# Patient Record
Sex: Female | Born: 1940
Health system: Southern US, Community
[De-identification: ages and names within clinical notes are randomized; demographics above are authoritative.]

## PROBLEM LIST (undated history)

## (undated) DIAGNOSIS — K5792 Diverticulitis of intestine, part unspecified, without perforation or abscess without bleeding: Secondary | ICD-10-CM

## (undated) DIAGNOSIS — I10 Essential (primary) hypertension: Secondary | ICD-10-CM

## (undated) DIAGNOSIS — F1090 Alcohol use, unspecified, uncomplicated: Secondary | ICD-10-CM

## (undated) DIAGNOSIS — Z83719 Family history of colon polyps, unspecified: Secondary | ICD-10-CM

## (undated) DIAGNOSIS — D649 Anemia, unspecified: Secondary | ICD-10-CM

## (undated) DIAGNOSIS — Z5189 Encounter for other specified aftercare: Secondary | ICD-10-CM

## (undated) DIAGNOSIS — D374 Neoplasm of uncertain behavior of colon: Secondary | ICD-10-CM

## (undated) DIAGNOSIS — M1990 Unspecified osteoarthritis, unspecified site: Secondary | ICD-10-CM

## (undated) DIAGNOSIS — Z8371 Family history of colonic polyps: Secondary | ICD-10-CM

## (undated) DIAGNOSIS — D638 Anemia in other chronic diseases classified elsewhere: Secondary | ICD-10-CM

## (undated) DIAGNOSIS — E038 Other specified hypothyroidism: Secondary | ICD-10-CM

## (undated) DIAGNOSIS — N1832 Chronic kidney disease, stage 3b: Secondary | ICD-10-CM

## (undated) DIAGNOSIS — K449 Diaphragmatic hernia without obstruction or gangrene: Secondary | ICD-10-CM

## (undated) DIAGNOSIS — T7840XA Allergy, unspecified, initial encounter: Secondary | ICD-10-CM

## (undated) DIAGNOSIS — R011 Cardiac murmur, unspecified: Secondary | ICD-10-CM

## (undated) DIAGNOSIS — K760 Fatty (change of) liver, not elsewhere classified: Secondary | ICD-10-CM

## (undated) DIAGNOSIS — M810 Age-related osteoporosis without current pathological fracture: Secondary | ICD-10-CM

## (undated) HISTORY — DX: Family history of colon polyps, unspecified: Z83.719

## (undated) HISTORY — DX: Unspecified osteoarthritis, unspecified site: M19.90

## (undated) HISTORY — PX: TUBAL LIGATION: SHX77

## (undated) HISTORY — PX: TOE SURGERY: SHX1073

## (undated) HISTORY — DX: Cardiac murmur, unspecified: R01.1

## (undated) HISTORY — DX: Anemia, unspecified: D64.9

## (undated) HISTORY — PX: ABDOMINAL HYSTERECTOMY: SHX81

## (undated) HISTORY — DX: Neoplasm of uncertain behavior of colon: D37.4

## (undated) HISTORY — DX: Encounter for other specified aftercare: Z51.89

## (undated) HISTORY — DX: Allergy, unspecified, initial encounter: T78.40XA

## (undated) HISTORY — DX: Diaphragmatic hernia without obstruction or gangrene: K44.9

## (undated) HISTORY — DX: Age-related osteoporosis without current pathological fracture: M81.0

## (undated) HISTORY — DX: Family history of colonic polyps: Z83.71

## (undated) HISTORY — DX: Essential (primary) hypertension: I10

## (undated) HISTORY — PX: LAPAROSCOPIC SIGMOID COLECTOMY: SHX5928

## (undated) HISTORY — DX: Diverticulitis of intestine, part unspecified, without perforation or abscess without bleeding: K57.92

## (undated) HISTORY — PX: PARTIAL HYSTERECTOMY: SHX80

---

## 1998-03-21 ENCOUNTER — Ambulatory Visit (HOSPITAL_COMMUNITY): Admission: RE | Admit: 1998-03-21 | Discharge: 1998-03-21 | Payer: Self-pay | Admitting: Family Medicine

## 1998-04-08 ENCOUNTER — Other Ambulatory Visit: Admission: RE | Admit: 1998-04-08 | Discharge: 1998-04-08 | Payer: Self-pay | Admitting: Gastroenterology

## 1998-04-29 ENCOUNTER — Inpatient Hospital Stay (HOSPITAL_COMMUNITY): Admission: RE | Admit: 1998-04-29 | Discharge: 1998-05-06 | Payer: Self-pay | Admitting: General Surgery

## 2000-05-25 ENCOUNTER — Ambulatory Visit (HOSPITAL_COMMUNITY): Admission: RE | Admit: 2000-05-25 | Discharge: 2000-05-25 | Payer: Self-pay | Admitting: Internal Medicine

## 2000-05-25 ENCOUNTER — Encounter: Payer: Self-pay | Admitting: Internal Medicine

## 2000-06-08 ENCOUNTER — Encounter (INDEPENDENT_AMBULATORY_CARE_PROVIDER_SITE_OTHER): Payer: Self-pay

## 2000-06-08 ENCOUNTER — Other Ambulatory Visit: Admission: RE | Admit: 2000-06-08 | Discharge: 2000-06-08 | Payer: Self-pay | Admitting: Gastroenterology

## 2003-09-12 ENCOUNTER — Encounter: Admission: RE | Admit: 2003-09-12 | Discharge: 2003-09-12 | Payer: Self-pay | Admitting: Internal Medicine

## 2005-07-22 ENCOUNTER — Ambulatory Visit: Payer: Self-pay | Admitting: Internal Medicine

## 2005-08-05 ENCOUNTER — Ambulatory Visit: Payer: Self-pay | Admitting: Internal Medicine

## 2005-08-11 ENCOUNTER — Encounter: Admission: RE | Admit: 2005-08-11 | Discharge: 2005-08-11 | Payer: Self-pay | Admitting: Internal Medicine

## 2006-04-23 ENCOUNTER — Ambulatory Visit: Payer: Self-pay | Admitting: Internal Medicine

## 2006-05-24 ENCOUNTER — Ambulatory Visit: Payer: Self-pay | Admitting: Internal Medicine

## 2006-11-26 ENCOUNTER — Encounter: Admission: RE | Admit: 2006-11-26 | Discharge: 2006-11-26 | Payer: Self-pay | Admitting: Internal Medicine

## 2007-03-07 ENCOUNTER — Ambulatory Visit: Payer: Self-pay | Admitting: Gastroenterology

## 2007-03-22 ENCOUNTER — Ambulatory Visit: Payer: Self-pay | Admitting: Gastroenterology

## 2007-03-22 ENCOUNTER — Encounter: Payer: Self-pay | Admitting: Gastroenterology

## 2007-04-26 ENCOUNTER — Emergency Department (HOSPITAL_COMMUNITY): Admission: EM | Admit: 2007-04-26 | Discharge: 2007-04-26 | Payer: Self-pay | Admitting: Emergency Medicine

## 2007-05-09 ENCOUNTER — Ambulatory Visit: Payer: Self-pay | Admitting: Internal Medicine

## 2007-05-09 ENCOUNTER — Encounter: Payer: Self-pay | Admitting: Internal Medicine

## 2007-05-09 DIAGNOSIS — M169 Osteoarthritis of hip, unspecified: Secondary | ICD-10-CM | POA: Insufficient documentation

## 2007-05-09 DIAGNOSIS — D126 Benign neoplasm of colon, unspecified: Secondary | ICD-10-CM | POA: Insufficient documentation

## 2007-05-09 DIAGNOSIS — I10 Essential (primary) hypertension: Secondary | ICD-10-CM | POA: Insufficient documentation

## 2008-02-28 ENCOUNTER — Ambulatory Visit: Payer: Self-pay | Admitting: Internal Medicine

## 2008-02-28 DIAGNOSIS — L659 Nonscarring hair loss, unspecified: Secondary | ICD-10-CM | POA: Insufficient documentation

## 2008-02-28 LAB — CONVERTED CEMR LAB
Calcium: 9.9 mg/dL (ref 8.4–10.5)
Chloride: 99 meq/L (ref 96–112)
Creatinine, Ser: 0.8 mg/dL (ref 0.4–1.2)
Free T4: 0.6 ng/dL (ref 0.6–1.6)
GFR calc non Af Amer: 76 mL/min

## 2008-02-29 ENCOUNTER — Encounter: Payer: Self-pay | Admitting: Internal Medicine

## 2009-06-19 ENCOUNTER — Telehealth (INDEPENDENT_AMBULATORY_CARE_PROVIDER_SITE_OTHER): Payer: Self-pay | Admitting: *Deleted

## 2009-07-31 ENCOUNTER — Ambulatory Visit: Payer: Self-pay | Admitting: Internal Medicine

## 2009-07-31 DIAGNOSIS — J069 Acute upper respiratory infection, unspecified: Secondary | ICD-10-CM | POA: Insufficient documentation

## 2009-07-31 LAB — CONVERTED CEMR LAB
Basophils Absolute: 0 10*3/uL (ref 0.0–0.1)
Calcium: 10.8 mg/dL — ABNORMAL HIGH (ref 8.4–10.5)
Creatinine, Ser: 0.9 mg/dL (ref 0.4–1.2)
Eosinophils Absolute: 0 10*3/uL (ref 0.0–0.7)
Folate: >20 ng/mL
HCT: 36.2 % (ref 36.0–46.0)
Hemoglobin: 11.7 g/dL — ABNORMAL LOW (ref 12.0–15.0)
Lymphs Abs: 0.9 10*3/uL (ref 0.7–4.0)
MCHC: 32.3 g/dL (ref 30.0–36.0)
Monocytes Absolute: 0.9 10*3/uL (ref 0.1–1.0)
Neutro Abs: 5 10*3/uL (ref 1.4–7.7)
RDW: 13.1 % (ref 11.5–14.6)

## 2009-08-01 ENCOUNTER — Telehealth (INDEPENDENT_AMBULATORY_CARE_PROVIDER_SITE_OTHER): Payer: Self-pay | Admitting: *Deleted

## 2009-08-13 DIAGNOSIS — M810 Age-related osteoporosis without current pathological fracture: Secondary | ICD-10-CM | POA: Insufficient documentation

## 2009-08-13 HISTORY — DX: Age-related osteoporosis without current pathological fracture: M81.0

## 2009-08-13 HISTORY — PX: OTHER SURGICAL HISTORY: SHX169

## 2009-08-13 LAB — HM DEXA SCAN

## 2009-08-26 ENCOUNTER — Encounter (INDEPENDENT_AMBULATORY_CARE_PROVIDER_SITE_OTHER): Payer: Self-pay | Admitting: *Deleted

## 2009-08-26 ENCOUNTER — Ambulatory Visit: Payer: Self-pay | Admitting: Internal Medicine

## 2009-09-05 ENCOUNTER — Encounter: Payer: Self-pay | Admitting: Internal Medicine

## 2009-09-05 ENCOUNTER — Ambulatory Visit: Payer: Self-pay | Admitting: Internal Medicine

## 2009-09-10 ENCOUNTER — Encounter: Payer: Self-pay | Admitting: Internal Medicine

## 2009-09-19 ENCOUNTER — Telehealth: Payer: Self-pay | Admitting: Internal Medicine

## 2009-10-27 ENCOUNTER — Encounter: Payer: Self-pay | Admitting: Internal Medicine

## 2010-02-20 ENCOUNTER — Encounter (INDEPENDENT_AMBULATORY_CARE_PROVIDER_SITE_OTHER): Payer: Self-pay | Admitting: *Deleted

## 2010-03-05 ENCOUNTER — Encounter (INDEPENDENT_AMBULATORY_CARE_PROVIDER_SITE_OTHER): Payer: Self-pay | Admitting: *Deleted

## 2010-04-12 HISTORY — PX: COLONOSCOPY: SHX174

## 2010-04-12 LAB — HM DEXA SCAN

## 2010-04-12 LAB — HM COLONOSCOPY

## 2010-04-17 ENCOUNTER — Encounter (INDEPENDENT_AMBULATORY_CARE_PROVIDER_SITE_OTHER): Payer: Self-pay | Admitting: *Deleted

## 2010-04-21 ENCOUNTER — Ambulatory Visit: Payer: Self-pay | Admitting: Gastroenterology

## 2010-05-07 ENCOUNTER — Ambulatory Visit: Payer: Self-pay | Admitting: Gastroenterology

## 2010-05-11 ENCOUNTER — Encounter: Payer: Self-pay | Admitting: Gastroenterology

## 2010-08-14 NOTE — Miscellaneous (Signed)
Summary: BONE DENSITY  Clinical Lists Changes  Orders: Added new Test order of T-Bone Densitometry (77080) - Signed Added new Test order of T-Lumbar Vertebral Assessment (77082) - Signed 

## 2010-08-14 NOTE — Miscellaneous (Signed)
Summary: anamantle cream prescription  Clinical Lists Changes  Medications: Added new medication of ANAMANTLE HC 3-0.5 % KIT (LIDOCAINE-HYDROCORTISONE ACE) apply twice daily as needed for hemorrhoids - Signed Rx of ANAMANTLE HC 3-0.5 % KIT (LIDOCAINE-HYDROCORTISONE ACE) apply twice daily as needed for hemorrhoids;  #1 x 5;  Signed;  Entered by: Grier Rocher, RN;  Authorized by: Meryl Dare MD [facility];  Method used: Electronically to [facility]*, [address], [place], [place], [place]  [address], Ph: [phone], Fax: [phone] Observations: Added new observation of ALLERGY REV: Done (05/07/2010 11:00)    Prescriptions: ANAMANTLE HC 3-0.5 % KIT (LIDOCAINE-HYDROCORTISONE ACE) apply twice daily as needed for hemorrhoids  #1 x 5   Entered by:   Grier Rocher, RN   Authorized by:   Meryl Dare MD [facility]   Signed by:   Grier Rocher, RN on 05/07/2010   Method used:   Electronically to        [facility]* (retail)       [address]       [place], [place]  [address]       Ph: [phone]       Fax: [phone]   RxID:   [id number]

## 2010-08-14 NOTE — Letter (Signed)
Summary: Colonoscopy Letter  [facility]  [address] [place], [place] [address]   Phone: [phone]  Fax: [phone]      February 20, 2010 MRN: [id number]   Margaret Nguyen [address] [place], [place]  [address]   Dear Margaret Nguyen,   According to your medical record, it is time for you to schedule a Colonoscopy. The American Cancer Society recommends this procedure as a method to detect early colon cancer. Patients with a family history of colon cancer, or a personal history of colon polyps or inflammatory bowel disease are at increased risk.  This letter has beeen generated based on the recommendations made at the time of your procedure. If you feel that in your particular situation this may no longer apply, please contact our office.  Please call our office at [phone] to schedule this appointment or to update your records at your earliest convenience.  Thank you for cooperating with [place] to provide you with the very best care possible.   Sincerely,  Judie Petit T. Russella Dar, M.D.  [facility] Gastroenterology Division [phone]

## 2010-08-14 NOTE — Miscellaneous (Signed)
Summary: Orders Update   Clinical Lists Changes  Problems: Added new problem of OTHER SCREENING MAMMOGRAM (ICD-V76.12) - Signed Orders: Added new Referral order of Radiology Referral (Radiology) - Signed

## 2010-08-14 NOTE — Progress Notes (Signed)
  Phone Note Outgoing Call   Reason for Call: Discuss lab or test results Summary of Call: please call patient - lab results are normal including blood counts, B12, thyroid. Will review in detail at her Feb visit.  THANKS Initial call taken by: Jacques Navy MD,  August 01, 2009 5:04 AM  Follow-up for Phone Call        informed pt of results Follow-up by: Ami Bullins CMA,  August 01, 2009 9:24 AM

## 2010-08-14 NOTE — Progress Notes (Signed)
    Preventive Care Screening  Mammogram:    Date:  09/10/2009    Results:  normal  Bone Density:    Date:  09/05/2009    Results:  abnormal

## 2010-08-14 NOTE — Assessment & Plan Note (Signed)
Summary: PT HAS MULTIPLE PROBLEMS-COLD-PREVIOUS TONGUE SWELLING-LB   Vital Signs:  Patient profile:   69 year old female Height:      64 inches Weight:      104 pounds BMI:     17.92 O2 Sat:      97 % on Room air Temp:     98.8 degrees F oral Pulse rate:   98 / minute BP sitting:   132 / 86  (left arm) Cuff size:   regular  Vitals Entered By: Bill Salinas CMA (July 31, 2009 10:00 AM)  O2 Flow:  Room air CC: pt here with complaint of cough (producing green mucous), sore throat and congestion x 1 week. Pt states monday her tongue started swelling but states she isn't real sure what triggered the reaction. Pt states she did take some Alka Seltzer and also ate peanut butter and shrimp. Pt states she did get a little relief from benydrl. PT is past due for a mammogram and tetanus. / ab   Primary Care Provider:  Jobe Mutch  CC:  pt here with complaint of cough (producing green mucous) and sore throat and congestion x 1 week. Pt states monday her tongue started swelling but states she isn't real sure what triggered the reaction. Pt states she did take some Alka Seltzer and also ate peanut butter and shrimp. Pt states she did get a little relief from benydrl. PT is past due for a mammogram and tetanus. / ab.  History of Present Illness: Patient presents for 7-10 days of cough productive of a lot of phlegm, sore throat, rhinorrhea. She had swelling of the tongue starting Monday. She took some benadryl which seems to have stopped the swelling. She also had left sided HA at the frontal region and pressure behind the eye sockets. No documented fever, no rigors. she does have a sense of coldness that seems to move through her body. She has been taking Benadryl allergy-sinus( APAP, Diphenhydraminephenylephrine 5 mg) and alka-seltzer plus ( ASA, chlorpheneiramine malieate, dextromethorphan, phenylephrine)   Current Medications (verified): 1)  Enalapril Maleate 20 Mg  Tabs (Enalapril Maleate) .... Once  Daily 2)  Hydrochlorothiazide 25 Mg  Tabs (Hydrochlorothiazide) .... Once Daily 3)  Fish Oil   Oil (Fish Oil) .... Once Daily 4)  Centrum Silver   Tabs (Multiple Vitamins-Minerals) .... Once Daily 5)  Calcium 500/d 500-400 Mg-Unit  Chew (Calcium-Vitamin D) .... Once Daily  Allergies (verified): No Known Drug Allergies  Past History:  Past Medical History: Last updated: 05/09/2007 UCD villous adenoma of sigmoid colon hiatal hernia with stricture- s/p dilatation Colonic polyps, hx of Hypertension  Past Surgical History: Last updated: 05/09/2007 chalzaion excised right eye sigmoid colectomy for villous adenoma Hysterectomy Tubal ligation PSH reviewed for relevance, FH reviewed for relevance  Review of Systems       The patient complains of hoarseness, dyspnea on exertion, and prolonged cough.  The patient denies anorexia, fever, weight loss, weight gain, decreased hearing, chest pain, abdominal pain, muscle weakness, suspicious skin lesions, depression, and enlarged lymph nodes.    Physical Exam  General:  WNWD AA female in no distress Head:  minimal tenderness to percussion over the left maxillary Eyes:  vision grossly intact, pupils equal, pupils round, corneas and lenses clear, and no injection.   Ears:  R ear normal and L ear normal.   Mouth:  tongue appears to be normal. Posterior pharynx is normal Lungs:  normal respiratory effort, normal breath sounds, no crackles, and no wheezes.  Heart:  normal rate, regular rhythm, no murmur, and no gallop.   Msk:  normal ROM, no joint tenderness, and no joint swelling.   Neurologic:  alert & oriented X3 and cranial nerves II-XII intact.   Skin:  turgor normal and color normal.   Cervical Nodes:  no anterior cervical adenopathy and no posterior cervical adenopathy.   Psych:  Oriented X3 and memory intact for recent and remote.     Impression & Recommendations:  Problem # 1:  URI (ICD-465.9) Probable sinus infection.  Plan -  amoxicillin 875mg  two times a day x 7           loratadine 10mg  once daily, sudafed 30mg  three times a day, robitussin DM three times a day   Problem # 2:  CHILLS WITHOUT FEVER (ICD-780.64)  persistent non-specific symptoms. Neck exam is normal  Plan - CBCD, TSH  Orders: TLB-B12 + Folate Pnl (91478_29562-Z30/QMV) TLB-TSH (Thyroid Stimulating Hormone) (84443-TSH) TLB-CBC Platelet - w/Differential (85025-CBCD)  Complete Medication List: 1)  Enalapril Maleate 20 Mg Tabs (Enalapril maleate) .... Once daily 2)  Hydrochlorothiazide 25 Mg Tabs (Hydrochlorothiazide) .... Once daily 3)  Fish Oil Oil (Fish oil) .... Once daily 4)  Centrum Silver Tabs (Multiple vitamins-minerals) .... Once daily 5)  Calcium 500/d 500-400 Mg-unit Chew (Calcium-vitamin d) .... Once daily 6)  Amoxicillin 875 Mg Tabs (Amoxicillin) .Marland Kitchen.. 1 by mouth two times a day x 7 for uri  Other Orders: TLB-BMP (Basic Metabolic Panel-BMET) (80048-METABOL) Prescriptions: AMOXICILLIN 875 MG TABS (AMOXICILLIN) 1 by mouth two times a day x 7 for URI  #14 x 0   Entered and Authorized by:   Jacques Navy MD   Signed by:   Jacques Navy MD on 07/31/2009   Method used:   Electronically to        [facility]* (retail)       [address]       [place], [place]  [address]       Ph: [phone]       Fax: [phone]   RxID:   [id number]

## 2010-08-14 NOTE — Procedures (Signed)
Summary: Colonoscopy  Patient: Margaret Nguyen Note: All result statuses are Final unless otherwise noted.  Tests: (1) Colonoscopy (COL)   COL Colonoscopy           [facility]ONE      [facility]     [address]     [place], [place]  [address]           COLONOSCOPY PROCEDURE REPORT           PATIENT:  Margaret Nguyen, Margaret Nguyen  MR#:  [id number]     BIRTHDATE:  September 23, 1940, 69 yrs. old  GENDER:  female     ENDOSCOPIST:  Judie Petit T. Russella Dar, MD, [facility]           PROCEDURE DATE:  05/07/2010     PROCEDURE:  Colonoscopy with biopsy and snare polypectomy     ASA CLASS:  Class II     INDICATIONS:  1) surveillance and high-risk screening  2) history     of adenomatous colon polyps, 1999     MEDICATIONS:   Fentanyl 75 mcg IV, Versed 7 mg IV     DESCRIPTION OF PROCEDURE:   After the risks benefits and     alternatives of the procedure were thoroughly explained, informed     consent was obtained.  Digital rectal exam was performed and     revealed moderate external hemorrhoids.   The LB [id number]-[id number]     [id number] endoscope was introduced through the anus and advanced to     the cecum, which was identified by both the appendix and ileocecal     valve, without limitations.  The quality of the prep was     excellent, using MoviPrep.  The instrument was then slowly     withdrawn as the colon was fully examined.     <<PROCEDUREIMAGES>>     FINDINGS:  Scattered diverticula were found in the descending to     ascending colon.  A sessile polyp was found in the ascending     colon. It was 4 mm in size. The polyp was removed using cold     biopsy forceps.  A sessile polyp was found in the descending     colon. It was 5 mm in size. Polyp was snared without cautery.     Retrieval was successful. There was evidence of a prior segmental     colectomy in the sigmoid colon. This was otherwise a normal     examination of the colon.   Retroflexed views in the rectum     revealed internal hemorrhoids, small.  The time to  cecum =  1.75     minutes. The scope was then withdrawn (time =  8  min) from the     patient and the procedure completed.           COMPLICATIONS:  None           ENDOSCOPIC IMPRESSION:     1) Diverticula, scattered     2) 4 mm sessile polyp in the ascending colon     3) 5 mm sessile polyp in the descending colon     4) Prior segmental colectomy     5) Internal and external hemorrhoids           RECOMMENDATIONS:     1) Await pathology results     2) High fiber diet with liberal fluid intake.     3) Repeat Colonoscopy in 3 years pending pathology review.  Venita Lick. Russella Dar, MD, [facility]           CC: Jacques Navy, MD           n.     Rosalie DoctorVenita Lick. Stark at 05/07/2010 10:19 AM           Matilde Bash, [id number]  Note: An exclamation mark (!) indicates a result that was not dispersed into the flowsheet. Document Creation Date: 05/07/2010 10:20 AM _______________________________________________________________________  (1) Order result status: Final Collection or observation date-time: 05/07/2010 10:14 Requested date-time:  Receipt date-time:  Reported date-time:  Referring Physician:   Ordering Physician: Claudette Head [phone]) Specimen Source:  Source: Launa Grill Order Number: [id number] Lab site:   Appended Document: Colonoscopy     Procedures Next Due Date:    Colonoscopy: 04/2013

## 2010-08-14 NOTE — Letter (Signed)
Summary: Previsit letter  [facility]  [address] [place], [place] [address]   Phone: [phone]  Fax: [phone]       03/05/2010 MRN: [id number]  [facility] [address] [place], [place]  [address]  Dear Ms. Margaret Nguyen,  Welcome to the Gastroenterology Division at [facility].    You are scheduled to see a nurse for your pre-procedure visit on 04/21/2010 at 9:00AM on the 3rd floor at [facility], [address]  We ask that you try to arrive at our office 15 minutes prior to your appointment time to allow for check-in.  Your nurse visit will consist of discussing your medical and surgical history, your immediate family medical history, and your medications.    Please bring a complete list of all your medications or, if you prefer, bring the medication bottles and we will list them.  We will need to be aware of both prescribed and over the counter drugs.  We will need to know exact dosage information as well.  If you are on blood thinners (Coumadin, Plavix, Aggrenox, Ticlid, etc.) please call our office today/prior to your appointment, as we need to consult with your physician about holding your medication.   Please be prepared to read and sign documents such as consent forms, a financial agreement, and acknowledgement forms.  If necessary, and with your consent, a friend or relative is welcome to sit-in on the nurse visit with you.  Please bring your insurance card so that we may make a copy of it.  If your insurance requires a referral to see a specialist, please bring your referral form from your primary care physician.  No co-pay is required for this nurse visit.     If you cannot keep your appointment, please call [phone] to cancel or reschedule prior to your appointment date.  This allows [place] the opportunity to schedule an appointment for another patient in need of care.    Thank you for choosing [facility] for your  medical needs.  We appreciate the opportunity to care for you.  Please visit [place] at our website  to learn more about our practice.                     Sincerely.                                                                                                                   The Gastroenterology Division

## 2010-08-14 NOTE — Letter (Signed)
Summary: Moviprep Instructions  [facility]  [address]   [place], [place] [address]   Phone: [phone]  Fax: [phone]       Margaret Nguyen    September 16, 1940    MRN: [id number]        Procedure Day /Date: Wednesday, 05-07-10     Arrival Time: 9:00 a.m.     Procedure Time: 10:00 a.m.     Location of Procedure:                    x   [facility] (4th Floor)                        PREPARATION FOR COLONOSCOPY WITH MOVIPREP   Starting 5 days prior to your procedure 05-02-10  do not eat nuts, seeds, popcorn, corn, beans, peas,  salads, or any raw vegetables.  Do not take any fiber supplements (e.g. Metamucil, Citrucel, and Benefiber).  THE DAY BEFORE YOUR PROCEDURE         DATE: 05-06-10   DAY: Tuesday  1.  Drink clear liquids the entire day-NO SOLID FOOD  2.  Do not drink anything colored red or purple.  Avoid juices with pulp.  No orange juice.  3.  Drink at least 64 oz. (8 glasses) of fluid/clear liquids during the day to prevent dehydration and help the prep work efficiently.  CLEAR LIQUIDS INCLUDE: Water Jello Ice Popsicles Tea (sugar ok, no milk/cream) Powdered fruit flavored drinks Coffee (sugar ok, no milk/cream) Gatorade Juice: apple, white grape, white cranberry  Lemonade Clear bullion, consomm, broth Carbonated beverages (any kind) Strained chicken noodle soup Hard Candy                             4.  In the morning, mix first dose of MoviPrep solution:    Empty 1 Pouch A and 1 Pouch B into the disposable container    Add lukewarm drinking water to the top line of the container. Mix to dissolve    Refrigerate (mixed solution should be used within 24 hrs)  5.  Begin drinking the prep at 5:00 p.m. The MoviPrep container is divided by 4 marks.   Every 15 minutes drink the solution down to the next mark (approximately 8 oz) until the full liter is complete.   6.  Follow completed prep with 16 oz of clear liquid of your  choice (Nothing red or purple).  Continue to drink clear liquids until bedtime.  7.  Before going to bed, mix second dose of MoviPrep solution:    Empty 1 Pouch A and 1 Pouch B into the disposable container    Add lukewarm drinking water to the top line of the container. Mix to dissolve    Refrigerate  THE DAY OF YOUR PROCEDURE      DATE: 05-07-10  DAY: Wednesday  Beginning at 5:00 a.m. (5 hours before procedure):         1. Every 15 minutes, drink the solution down to the next mark (approx 8 oz) until the full liter is complete.  2. Follow completed prep with 16 oz. of clear liquid of your choice.    3. You may drink clear liquids until 8:00 a.m.   (2 HOURS BEFORE PROCEDURE).   MEDICATION INSTRUCTIONS  Unless otherwise instructed, you should take regular prescription medications with a small sip of water   as early  as possible the morning of your procedure.    Additional medication instructions: Do not take HCTZ day of procedure.         OTHER INSTRUCTIONS  You will need a responsible adult at least 70 years of age to accompany you and drive you home.   This person must remain in the waiting room during your procedure.  Wear loose fitting clothing that is easily removed.  Leave jewelry and other valuables at home.  However, you may wish to bring a book to read or  an iPod/MP3 player to listen to music as you wait for your procedure to start.  Remove all body piercing jewelry and leave at home.  Total time from sign-in until discharge is approximately 2-3 hours.  You should go home directly after your procedure and rest.  You can resume normal activities the  day after your procedure.  The day of your procedure you should not:   Drive   Make legal decisions   Operate machinery   Drink alcohol   Return to work  You will receive specific instructions about eating, activities and medications before you leave.    The above instructions have been reviewed  and explained to me by   Ezra Sites RN  April 21, 2010 9:10 AM    I fully understand and can verbalize these instructions _____________________________ Date _________

## 2010-08-14 NOTE — Letter (Signed)
Summary: Patient Notice- Polyp Results  [facility]  [address] [place], [place] [address]   Phone: [phone]  Fax: [phone]        May 11, 2010 MRN: [id number]    Margaret Nguyen [address] [place], [place]  [address]    Dear Ms. [name],  I am pleased to inform you that the colon polyp(s) removed during your recent colonoscopy was (were) found to be benign (no cancer detected) upon pathologic examination.  I recommend you have a repeat colonoscopy examination in 3 years to look for recurrent polyps, as having colon polyps increases your risk for having recurrent polyps or even colon cancer in the future.  Should you develop new or worsening symptoms of abdominal pain, bowel habit changes or bleeding from the rectum or bowels, please schedule an evaluation with either your primary care physician or with me.  Continue treatment plan as outlined the day of your exam.  Please call us if you are having persistent problems or have questions about your condition that have not been fully answered at this time.  Sincerely,  Meryl Dare MD [facility]  This letter has been electronically signed by your physician.  Appended Document: Patient Notice- Polyp Results letter mailed

## 2010-08-14 NOTE — Assessment & Plan Note (Signed)
Summary: YEARLY FU/ LABS SAME DAY/NWS   Vital Signs:  Patient profile:   70 year old female Height:      64 inches Weight:      106 pounds BMI:     18.26 O2 Sat:      98 % on Room air Temp:     98.3 degrees F oral Pulse rate:   88 / minute BP sitting:   136 / 74  (left arm) Cuff size:   regular  Vitals Entered By: Bill Salinas CMA (August 26, 2009 10:03 AM)  O2 Flow:  Room air CC: pt here for yearly cpx. Pt is due for a tetanus shot, she has never had shingles vaccine. Las Bone Density scan ?, pt can't remember when she had her last eye exam or who she sees/ ab   Primary Care Provider:  Mikell Camp  CC:  pt here for yearly cpx. Pt is due for a tetanus shot, she has never had shingles vaccine. Las Bone Density scan ?, and pt can't remember when she had her last eye exam or who she sees/ ab.  History of Present Illness: Patient last seen in January for an URI. she has done better but still has some sinus congestion.   Reviewed chart and called [facility] - last [facility]ogram at [facility] in May '08.   Patient claims that she has had several visits to [facility] ED. No records can be located in EMR or [place] of any visits other than October 14th. Check ImageCast - no studies except for Apr 26, 2007. No record of any in-hospital stress tests or  bone density study which she claims she has had.   She reports that other then her cold symtoms she is feeling well.   Current Medications (verified): 1)  Enalapril Maleate 20 Mg  Tabs (Enalapril Maleate) .... Once Daily 2)  Hydrochlorothiazide 25 Mg  Tabs (Hydrochlorothiazide) .... Once Daily 3)  Fish Oil   Oil (Fish Oil) .... Once Daily 4)  Centrum Silver   Tabs (Multiple Vitamins-Minerals) .... Once Daily 5)  Calcium 500/d 500-400 Mg-Unit  Chew (Calcium-Vitamin D) .... Once Daily  Allergies (verified): No Known Drug Allergies  Past History:  Past Medical History: UCD villous adenoma of sigmoid colon hiatal hernia with  stricture- s/p dilatation Colonic polyps, hx of Hypertension   Physician roster:                    [facility] - Dr. Dione Booze  Past Surgical History: Reviewed history from 05/09/2007 and no changes required. chalzaion excised right eye sigmoid colectomy for villous adenoma Hysterectomy Tubal ligation  Family History: Reviewed history from 05/09/2007 and no changes required. father - died 41 heart disease, rheumatic fever mother- died 18, CAD/MI positive for arthritis, renal failure and hypertension  Social History: Reviewed history from 05/09/2007 and no changes required. married [phone], widowed 1 son, 2 daughters, 1 son deceased 5 grandchildren Lives alone; [employer] working- fulltime Health and safety inspector.  Review of Systems  The patient denies anorexia, fever, weight loss, weight gain, vision loss, chest pain, syncope, dyspnea on exertion, hemoptysis, abdominal pain, severe indigestion/heartburn, muscle weakness, transient blindness, difficulty walking, abnormal bleeding, enlarged lymph nodes, and angioedema.    Physical Exam  General:  Thin AA female in no distress Head:  Normocephalic and atraumatic without obvious abnormalities. No apparent alopecia or balding. Eyes:  pupils equal, pupils round, corneas and lenses clear, and no injection.   Ears:  R ear normal and L  ear normal.   Nose:  no external deformity and no external erythema.   Mouth:  missing several teeth: molars and premolars. No gingival disease, no buccal lesions, posterior pharynx clear Neck:  full ROM, no thyromegaly, and no carotid bruits.   Chest Wall:  No deformities, masses, or tenderness noted. Breasts:  No mass, nodules, thickening, tenderness, bulging, retraction, inflamation, nipple discharge or skin changes noted.   Lungs:  normal respiratory effort, normal breath sounds, no crackles, and no wheezes.   Heart:  normal rate and regular rhythm.  II/VI sysolic murmur LSB Abdomen:  soft, non-tender, normal  bowel sounds, no guarding, and no hepatomegaly.   Msk:  normal ROM, no joint tenderness, and no joint swelling.  Mild interosseous wasting Pulses:  2+ radial and DP pulses Extremities:  No clubbing, cyanosis, edema, or deformity noted with normal full range of motion of all joints.   Neurologic:  No cranial nerve deficits noted. Station and gait are normal. Plantar reflexes are down-going bilaterally. DTRs are symmetrical throughout. Sensory, motor and coordinative functions appear intact.   Impression & Recommendations:  Problem # 1:  HYPERTENSION (ICD-401.9) Good control on present meds. Labs were normal in Jan. '11  Plan - continue present meds.  Her updated medication list for this problem includes:    Enalapril Maleate 20 Mg Tabs (Enalapril maleate) ..... Once daily    Hydrochlorothiazide 25 Mg Tabs (Hydrochlorothiazide) ..... Once daily  Problem # 2:  Preventive Health Care (ICD-V70.0) Normal history and exam. Labs in January OK. She is current with colorectal cancer screening. She will be scheduled for mammogram. Tetnus is brought up to date. She will be scheduled for a bone density study.   In summary - a very nice woman who seems healthy and medically stable. she will return in 1 year or as needed.   Complete Medication List: 1)  Enalapril Maleate 20 Mg Tabs (Enalapril maleate) .... Once daily 2)  Hydrochlorothiazide 25 Mg Tabs (Hydrochlorothiazide) .... Once daily 3)  Fish Oil Oil (Fish oil) .... Once daily 4)  Centrum Silver Tabs (Multiple vitamins-minerals) .... Once daily 5)  Calcium 500/d 500-400 Mg-unit Chew (Calcium-vitamin d) .... Once daily   Patient: Margaret Nguyen Note: All result statuses are Final unless otherwise noted.  Tests: (1) B12 + Folate Panel (B12/FOL)   Vitamin B12               583 pg/mL                   211-911   Folate                    >20.0 ng/mL     Deficient  0.4 - 3.4 ng/mL     Indeterminate  3.4 - 5.4 ng/mL     Normal  >5.4  ng/mL  Tests: (2) TSH (TSH)   FastTSH                   2.94 uIU/mL                 0.35-5.50  Tests: (3) CBC Platelet w/Diff (CBCD)   White Cell Count          6.8 K/uL                    4.5-10.5   Red Cell Count            3.92 Mil/uL  3.87-5.11   Hemoglobin           [L]  11.7 g/dL                   04.5-40.9   Hematocrit                36.2 %                      36.0-46.0   MCV                       92.4 fl                     78.0-100.0   MCHC                      32.3 g/dL                   81.1-91.4   RDW                       13.1 %                      11.5-14.6   Platelet Count            359.0 K/uL                  150.0-400.0   Neutrophil %              72.1 %                      43.0-77.0   Lymphocyte %              13.9 %                      12.0-46.0   Monocyte %           [H]  13.4 %                      3.0-12.0   Eosinophils%              0.6 %                       0.0-5.0   Basophils %               0.0 %                       0.0-3.0   Neutrophill Absolute      5.0 K/uL                    1.4-7.7   Lymphocyte Absolute       0.9 K/uL                    0.7-4.0   Monocyte Absolute         0.9 K/uL                    0.1-1.0  Eosinophils, Absolute                             0.0 K/uL  0.0-0.7   Basophils Absolute        0.0 K/uL                    0.0-0.1  Tests: (4) BMP (METABOL)   Sodium                    139 mEq/L                   135-145   Potassium                 3.9 mEq/L                   3.5-5.1   Chloride                  99 mEq/L                    96-112   Carbon Dioxide            31 mEq/L                    19-32   Glucose              [H]  138 mg/dL                   81-19   BUN                       10 mg/dL                    1-47   Creatinine                0.9 mg/dL                   8.2-9.5   Calcium              [H]  10.8 mg/dL                  6.2-13.0   GFR                       79.97 mL/min                 >60Prescriptions: HYDROCHLOROTHIAZIDE 25 MG  TABS (HYDROCHLOROTHIAZIDE) once daily  #90 x 3   Entered and Authorized by:   Jacques Navy MD   Signed by:   Jacques Navy MD on 08/26/2009   Method used:   Electronically to        [facility]* (retail)       [address]       [place], [place]  [address]       Ph: [phone]       Fax: [phone]   RxID:   [id number] ENALAPRIL MALEATE 20 MG  TABS (ENALAPRIL MALEATE) once daily  #90 x 3   Entered and Authorized by:   Jacques Navy MD   Signed by:   Jacques Navy MD on 08/26/2009   Method used:   Electronically to        [facility]* (retail)       [address]       [place], [place]  [address]  Ph: [phone]       Fax: [phone]   RxID:   [id number]    Preventive Care Screening  Last Pneumovax:    Date:  02/24/2006    Results:  given

## 2010-08-14 NOTE — Miscellaneous (Signed)
Summary: [facility]  Clinical Lists Changes  Medications: Added new medication of MOVIPREP 100 GM  SOLR (PEG-KCL-NACL-NASULF-NA ASC-C) As per prep instructions. - Signed Rx of MOVIPREP 100 GM  SOLR (PEG-KCL-NACL-NASULF-NA ASC-C) As per prep instructions.;  #1 x 0;  Signed;  Entered by: Ezra Sites RN;  Authorized by: Meryl Dare MD [facility];  Method used: Electronically to [facility] [address], [place], [place], [place]  [address], Ph: [phone], Fax: [phone] Observations: Added new observation of NKA: T (04/21/2010 8:44)    Prescriptions: MOVIPREP 100 GM  SOLR (PEG-KCL-NACL-NASULF-NA ASC-C) As per prep instructions.  #1 x 0   Entered by:   Ezra Sites RN   Authorized by:   Meryl Dare MD [facility]   Signed by:   Ezra Sites RN on 04/21/2010   Method used:   Electronically to        [facility]* (retail)       [address]       [place], [place]  [address]       Ph: [phone]       Fax: [phone]   RxID:   [id number]

## 2010-08-14 NOTE — Letter (Signed)
   [facility] [address] [place], [place]  [address] Phone: [phone]      October 30, 2009   [facility] [address] [place], [place] [address]  RE:  LAB RESULTS  Dear  [name],  The following is an interpretation of your most recent lab tests.  Please take note of any instructions provided or changes to medications that have resulted from your lab work.   Bone density study reveals loss of bone at spine and left hip and osteoporosis of the right hip. This results in an increased fracture risk.   Please come in to see me for a detailed review of your study and to discuss treatment options.    Sincerely Yours,    Jacques Navy MD

## 2010-09-19 ENCOUNTER — Telehealth: Payer: Self-pay | Admitting: Internal Medicine

## 2010-09-30 NOTE — Progress Notes (Signed)
Summary: CALL  Phone Note Call from Patient Call back at Home Phone [phone]   Summary of Call: Patient is requesting a call back.  Initial call taken by: Lamar Sprinkles, CMA,  September 19, 2010 9:44 AM  Follow-up for Phone Call        Multiple attempts today, no answer, left mess to call office back   Additional Follow-up for Phone Call Additional follow up Details #1::        left vm for pt to call office back...................Marland KitchenLamar Sprinkles, CMA  September 22, 2010 6:53 PM   left mess to call office back....................Marland KitchenLamar Sprinkles, CMA  September 23, 2010 3:45 PM

## 2010-11-06 ENCOUNTER — Ambulatory Visit (INDEPENDENT_AMBULATORY_CARE_PROVIDER_SITE_OTHER)
Admission: RE | Admit: 2010-11-06 | Discharge: 2010-11-06 | Disposition: A | Discharge status: Home / Self Care | Payer: Medicare Other | Source: Ambulatory Visit | Attending: Internal Medicine | Admitting: Internal Medicine

## 2010-11-06 ENCOUNTER — Ambulatory Visit (INDEPENDENT_AMBULATORY_CARE_PROVIDER_SITE_OTHER): Payer: Medicare Other | Admitting: Internal Medicine

## 2010-11-06 ENCOUNTER — Other Ambulatory Visit (INDEPENDENT_AMBULATORY_CARE_PROVIDER_SITE_OTHER): Payer: Medicare Other

## 2010-11-06 VITALS — BP 108/60 | HR 72 | Temp 98.2°F | Wt 111.0 lb

## 2010-11-06 DIAGNOSIS — I10 Essential (primary) hypertension: Secondary | ICD-10-CM

## 2010-11-06 DIAGNOSIS — R6889 Other general symptoms and signs: Secondary | ICD-10-CM

## 2010-11-06 DIAGNOSIS — M79609 Pain in unspecified limb: Secondary | ICD-10-CM

## 2010-11-06 DIAGNOSIS — M169 Osteoarthritis of hip, unspecified: Secondary | ICD-10-CM

## 2010-11-06 DIAGNOSIS — M79672 Pain in left foot: Secondary | ICD-10-CM

## 2010-11-06 DIAGNOSIS — M1610 Unilateral primary osteoarthritis, unspecified hip: Secondary | ICD-10-CM

## 2010-11-06 LAB — CBC WITH DIFFERENTIAL/PLATELET
Basophils Relative: 1.5 % (ref 0.0–3.0)
Eosinophils Absolute: 0.1 10*3/uL (ref 0.0–0.7)
HCT: 34.7 % — ABNORMAL LOW (ref 36.0–46.0)
Hemoglobin: 11.7 g/dL — ABNORMAL LOW (ref 12.0–15.0)
Lymphs Abs: 0.8 10*3/uL (ref 0.7–4.0)
MCHC: 33.8 g/dL (ref 30.0–36.0)
MCV: 88.1 fl (ref 78.0–100.0)
Monocytes Absolute: 0.5 10*3/uL (ref 0.1–1.0)
Neutro Abs: 2.4 10*3/uL (ref 1.4–7.7)
RBC: 3.94 Mil/uL (ref 3.87–5.11)
RDW: 14 % (ref 11.5–14.6)

## 2010-11-06 LAB — COMPREHENSIVE METABOLIC PANEL
AST: 34 U/L (ref 0–37)
Alkaline Phosphatase: 55 U/L (ref 39–117)
BUN: 16 mg/dL (ref 6–23)
Calcium: 10.2 mg/dL (ref 8.4–10.5)
Chloride: 103 mEq/L (ref 96–112)
Creatinine, Ser: 0.8 mg/dL (ref 0.4–1.2)
Total Bilirubin: 0.6 mg/dL (ref 0.3–1.2)

## 2010-11-06 MED ORDER — ALENDRONATE SODIUM 70 MG PO TABS: 70.0000 mg | ORAL_TABLET | ORAL | Status: DC

## 2010-11-06 NOTE — Patient Instructions (Signed)
Foot pain - will check x-ray to make sure there is no fracture. OK to take aleve 1 in the AM and PM if needed for pain. Wear supportive shoes, like today's.  Osteoporosis of the right hip and some bone loss in the left hip and the spine. Plan - take alendronate once a week.   Osteoporosis Osteoporosis is a disease of the bones that makes them weaker and prone to break (fracture). By their mid-30s, most people begin to gradually lose bone strength. If this is severe enough, osteoporosis may occur. Osteopenia is a less severe weakness of the bones, which places you at risk for osteoporosis. It is important to identify if you have osteoporosis or osteopenia. Bone fractures from osteoporosis (especially hip and spine fractures) are a major cause of hospitalization, loss of independence, and can lead to life-threatening complications. CAUSES There are a number of causes and risk factors:  Gender. Women are at a higher risk for osteoporosis than men.   Age. Bone formation slows down with age.   Ethnicity. For unclear reasons, white and Asian women are at higher risk for osteoporosis. Hispanic and African American women are at increased, but lesser, risk.   Family history of osteoporosis can mean that you are at a higher risk for getting it.   History of bone fractures indicates you may be at higher risk of another.   Calcium is very important for bone health and strength. Not enough calcium in your diet increases your risk for osteoporosis. Vitamin D is important for calcium metabolism. You get vitamin D from sunlight, foods, or supplements.   Physical activity. Bones get stronger with weight-bearing exercise and weaker without use.   Smoking is associated with decreased bone strength.   Medicines. Cortisone medicines, too much thyroid medicine, some cancer and seizure medicines, and others can weaken bones and cause osteoporosis.   Decreased body weight is associated with osteoporosis. The  small amount of estrogen-type molecules produced in fat cells seems to protect the bones.   Menopausal decrease in the hormone estrogen can cause osteoporosis.   Low levels of the hormone testosterone can cause osteoporosis.   Some medical conditions can lead to osteoporosis (hyperthyroidism, hyperparathyroidism, B12 deficiency).  SYMPTOMS Usually, no symptoms are felt as the bones weaken. The first symptoms are generally related to bone fractures. You may have silent, tiny bone fractures, especially in your spine. This can cause height loss and forward bending of the spine (kyphosis). DIAGNOSIS You or your caregiver may suspect osteoporosis based on height loss and kyphosis. Osteoporosis or osteopenia may be identified on an X-ray done for other reasons. A bone density measurement will likely be taken. Your bones are often measured at your lower spine or your hips. Measurement is done by an X-ray called a DEXA scan, or sometimes by a computerized X-ray scan (CT or CAT scan). Other tests may be done to find the cause of osteoporosis, such as blood tests to measure calcium and vitamin D, or to monitor treatment. TREATMENT The goal of osteoporosis treatment is to prevent fractures. This is done through medicine and home care treatments. Treatment will slow the weakening of your bones and strengthen them where possible. Measures to decrease the likelihood of falling and fracturing a bone are also important. Medicine  You may need supplements if you are not getting enough calcium, vitamin D, and vitamin B12.   If you are female and menopausal, you should discuss the option of estrogen replacement or estrogen-like medicine with your  caregiver.   Medicines can be taken by mouth or injection to help build bone strength. When taken by mouth, there are important directions that you need to follow.   Calcitonin is a hormone made by the thyroid gland that can help build bone strength and decrease fracture  risk in the spine. It can be taken by nasal spray or injection.   Parathyroid hormone can be injected to help build bone strength.   You will need to continue to get enough calcium intake with any of these medicines.  FALL PREVENTION  If you are unsteady on your feet, use a cane, walker, or walk with someone's help.   Remove loose rugs or electrical cords from your home.   Keep your home well lit at night. Use glasses if you need them.   Avoid icy streets and wet or waxed floors.   Hold the railing when using stairs.   Watch out for your pets.   Install grab bars in your bathroom.   Exercise. Physical activity, especially weight-bearing exercise, helps strengthen bones. Strength and balance exercise, such as tai chi, helps prevent falls.   Alcohol and some medicines can make you more likely to fall. Discuss alcohol use with your caregiver. Ask your caregiver if any of your medicines might increase your risk for falling. Ask if safer alternatives are available.  HOME CARE INSTRUCTIONS  Try to prevent and avoid falls.   To pick up objects, bend at the knees. Do not bend with your back.   Do not smoke. If you smoke, ask for help to stop.   Have adequate calcium and vitamin D in your diet. Talk with your caregiver about amounts.   Before exercising, ask your caregiver what exercises will be good for you.   Only take over-the-counter or prescription medicines for pain, discomfort, or fever as directed by your caregiver.  SEEK MEDICAL CARE IF:  You have had a fracture and your pain is not controlled.   You have had a fracture and you are not able to return to activities as expected.   You are reinjured.   You develop side effects from medicines, especially stomach pain or trouble swallowing.   You develop new, unexplained problems.  SEEK IMMEDIATE MEDICAL CARE IF:  You develop sudden, severe pain in your back.   You develop pain after an injury or fall.  Document  Released: 04/08/2005 Document Re-Released: 12/17/2009 [facility] Patient Information 2011 [place], [place].

## 2010-11-07 ENCOUNTER — Telehealth: Payer: Self-pay | Admitting: *Deleted

## 2010-11-07 NOTE — Telephone Encounter (Signed)
Patient requesting results of xray

## 2010-11-07 NOTE — Telephone Encounter (Signed)
Tried calling home # x 2 - no answer. She has a fracture of the neck of the 3rd metatarsal - foot bone-left foot. She should wear a firm shoe or get a wooden shoe.

## 2010-11-07 NOTE — Progress Notes (Signed)
  Subjective:    Patient ID: Margaret Nguyen, female    DOB: 1941/07/04, 70 y.o.   MRN: [id number]  HPIMrs. Nguyen presents with a history of pain in the left forefoot for more than a week. She thinks she may have twisted her foot. It hurts to bear weight and there has been some swelling in the forefoot.  She is also concerned about her lipids and other lab work. She is for [facility] in June  PMH, FamHx and SocHx reviewed for any changes and relevance.     Review of Systems  Constitutional: Negative.   Musculoskeletal:       Foot pain with swelling  Psychiatric/Behavioral: Positive for hallucinations.  All other systems reviewed and are negative.       Objective:   Physical Exam WNWD AA woman in no acute distress HEENT- normal Cor - RRR Ext - left foot with mild non-pitting edema distally. Tender to palpation at the MTP joints. Well preserved range of motion of her foot.       Assessment & Plan:  1. Foot pain - xray reveals fracture of 2nd metatarsal neck.  Plan - wood shoe

## 2010-11-10 NOTE — Telephone Encounter (Signed)
Patient informed. 

## 2010-11-11 ENCOUNTER — Encounter: Payer: Self-pay | Admitting: Internal Medicine

## 2010-11-25 NOTE — Assessment & Plan Note (Signed)
[facility]                                 ON-CALL NOTE   NAME:WILLIAMSONDominque, Margaret Nguyen                 MRN:          [id number]  DATE:04/26/2007                            DOB:          1941/06/17    PRIMARY CARE PHYSICIAN:  Dr. Debby Bud.   [name] is patient of Dr. Debby Bud who calls in this morning  stating that she has been having significant left-sided abdominal pain  all night.  She states the pain has kept her up.  She denies any nausea,  vomiting, diarrhea, or constipation.  She denies any other symptoms.  She states that the pain is not severe, but moderate.  She has never had  this before.  She denies any trauma.   Patient reports that she lives close to [facility].  I advised patient,  since the pain has kept her up all night with no clear etiology, she  should be seen at the emergency department this morning.  She states  that she will either have one of her children driver her, or she will  driver herself.  She states the pain is not severe enough to prevent her  from driving.  I initially encouraged EMS, but she states she does not  need that.     Leanne Chang, M.D.  Electronically Signed    LA/MedQ  DD: 04/26/2007  DT: 04/26/2007  Job #: [id number]

## 2010-12-16 ENCOUNTER — Encounter: Payer: Self-pay | Admitting: Internal Medicine

## 2011-01-22 ENCOUNTER — Ambulatory Visit (INDEPENDENT_AMBULATORY_CARE_PROVIDER_SITE_OTHER): Payer: Medicare Other | Admitting: Internal Medicine

## 2011-01-22 ENCOUNTER — Encounter: Payer: Self-pay | Admitting: Internal Medicine

## 2011-01-22 VITALS — BP 116/70 | HR 86 | Temp 97.9°F | Ht 63.0 in | Wt 108.0 lb

## 2011-01-22 DIAGNOSIS — M169 Osteoarthritis of hip, unspecified: Secondary | ICD-10-CM

## 2011-01-22 DIAGNOSIS — I10 Essential (primary) hypertension: Secondary | ICD-10-CM

## 2011-01-22 DIAGNOSIS — Z23 Encounter for immunization: Secondary | ICD-10-CM

## 2011-01-22 DIAGNOSIS — Z0000 Encounter for general adult medical examination without abnormal findings: Secondary | ICD-10-CM

## 2011-01-22 MED ORDER — ENALAPRIL MALEATE 20 MG PO TABS: 20.0000 mg | ORAL_TABLET | Freq: Every day | ORAL | Status: DC

## 2011-01-22 MED ORDER — ALENDRONATE SODIUM 70 MG PO TABS: 70.0000 mg | ORAL_TABLET | ORAL | Status: AC

## 2011-01-22 MED ORDER — HYDROCHLOROTHIAZIDE 25 MG PO TABS: 25.0000 mg | ORAL_TABLET | Freq: Every day | ORAL | Status: DC

## 2011-01-22 NOTE — Progress Notes (Signed)
Subjective:    Patient ID: Margaret Nguyen, female    DOB: 16-May-1941, 70 y.o.   MRN: [id number]  HPI Patient presents for a medical wellness exam. She has been doing well. She did have a fracture of the metatarsal neck second toe left.    The risk factors are reflected in the social history.  The roster of all physicians providing medical care to patient - is listed in the Snapshot section of the chart.  Activities of daily living:  The patient is 100% inedpendent in all ADLs: dressing, toileting, feeding as well as independent mobility. She has had a fall that was due to tripping over the vacuum cleaner cord.   Home safety : The patient has smoke detectors in the home. They wear seatbelts.  Firearms are present in the home, kept in a safe fashion. There is no violence in the home.   There is no risks for hepatitis, STDs or HIV. There is  history of blood transfusion 1965. They have no travel history to infectious disease endemic areas of the world.  The patient has not seen their dentist in the last six month. They have not seen their eye doctor in the last year. They admit to hearing difficulty and have not had audiologic testing in the last year.  They do not  have excessive sun exposure. Discussed the need for sun protection: hats, long sleeves and use of sunscreen if there is significant sun exposure.   Diet: the importance of a healthy diet is discussed. They do have a healthy diet.  The patient does not have a regular exercise program: Although she states she does yard and house work, keeps a big garden daily.  The benefits of regular aerobic exercise were discussed.  Depression screen: there are no signs or vegative symptoms of depression- irritability, change in appetite, anhedonia, sadness/tearfullness.  Cognitive assessment: the patient manages all their financial and personal affairs and is actively engaged. They could relate day,date,year and events; recalled 3/3 objects  at 3 minutes; performed clock-face test normally.  The following portions of the patient's history were reviewed and updated as appropriate: allergies, current medications, past family history, past medical history,  past surgical history, past social history  and problem list.  Vision, hearing, body mass index were assessed and reviewed.   During the course of the visit the patient was educated and counseled about appropriate screening and preventive services including : fall prevention , diabetes screening, nutrition counseling, colorectal cancer screening, and recommended immunizations.  Past Medical History  Diagnosis Date  . Villous adenoma of colon   . Hiatal hernia   . Family hx colonic polyps   . Hypertension    Past Surgical History  Procedure Date  . Sigmoid colectomy for villous adenoma   . Abdominal hysterectomy   . Tubal ligation    No family history on file. History   Social History  . Marital Status: Widowed    Spouse Name: N/A    Number of Children: N/A  . Years of Education: N/A   Occupational History  . Not on file.   Social History Main Topics  . Smoking status: Never Smoker   . Smokeless tobacco: Not on file  . Alcohol Use: Not on file  . Drug Use: Not on file  . Sexually Active: Not on file   Other Topics Concern  . Not on file   Social History Narrative   Married [phone], widowed1 son, 2 daughters, 1 son deceased5  grandchildrenLives alone; I- ADLsWorking-fulltime [employer]       Review of Systems Review of Systems  Constitutional:  Negative for fever, chills, activity change and unexpected weight change.  HEENT:  Negative for hearing loss, ear pain, congestion, neck stiffness and postnasal drip. Negative for sore throat or swallowing problems. Negative for dental complaints.   Eyes: Negative for vision loss or change in visual acuity.  Respiratory: Negative for chest tightness and wheezing.   Cardiovascular: Negative for chest pain  and palpitation. No decreased exercise tolerance Gastrointestinal: No change in bowel habit. No bloating or gas. No reflux or indigestion Genitourinary: Negative for urgency, frequency, flank pain and difficulty urinating.  Musculoskeletal: Negative for myalgias, back pain. Positive arthralgias but no gait problem.  Neurological: Negative for dizziness, tremors, weakness and headaches.  Hematological: Negative for adenopathy.  Psychiatric/Behavioral: Negative for behavioral problems and dysphoric mood.       Objective:   Physical Exam Vitals reviewed. Gen'l: well nourished, well developed AA woman in no distress HEENT - Bud/AT, EACs/TMs normal, oropharynx with native dentition in poor condition missing several teeth and with possible caries, no buccal or palatal lesions, posterior pharynx clear, mucous membranes moist. C&S clear, PERRLA, question of early cataract OD. Unable to visualize fundi.  Neck - supple, no thyromegaly Nodes- negative submental, cervical, supraclavicular regions, axillary Chest - no deformity, no CVAT Lungs - clear without rales, wheezes. No increased work of breathing Breast - pendulous, skin normal, nipples without discharge, no fixed mass or lesion. Cardiovascular - regular rate and rhythm, quiet precordium, no murmurs, rubs or gallops, 2+ radial, DP and PT pulses Abdomen - BS+ x 4, no HSM, no guarding or rebound or tenderness Pelvic - deferred  Rectal - deferred  Extremities - no clubbing, cyanosis, edema or deformity. Tender MTP 2nd toe left. Normal motion Neuro - A&O x 3, CN II-XII normal, motor strength normal and equal, DTRs 2+ and symmetrical biceps, radial, and patellar tendons. Cerebellar - no tremor, no rigidity, fluid movement and normal gait. Derm - Head, neck, back, abdomen and extremities without suspicious lesions  Lab Results  Component Value Date   WBC 3.8* 11/06/2010   HGB 11.7* 11/06/2010   HCT 34.7* 11/06/2010   PLT 370.0 11/06/2010   ALT 24  11/06/2010   AST 34 11/06/2010   NA 141 11/06/2010   K 4.1 11/06/2010   CL 103 11/06/2010   CREATININE 0.8 11/06/2010   BUN 16 11/06/2010   CO2 28 11/06/2010   TSH 2.85 11/06/2010           Assessment & Plan:

## 2011-01-24 DIAGNOSIS — Z0000 Encounter for general adult medical examination without abnormal findings: Secondary | ICD-10-CM | POA: Insufficient documentation

## 2011-01-24 NOTE — Assessment & Plan Note (Signed)
Continued problem but she is doing relatively well with no significant limitations in activity.   Plan - otc NSAIDs as needed.

## 2011-01-24 NOTE — Assessment & Plan Note (Signed)
BP Readings from Last 3 Encounters:  01/22/11 116/70  11/06/10 108/60  08/26/09 136/74   Good control on present regimen. Will continue the same

## 2011-01-24 NOTE — Assessment & Plan Note (Addendum)
Interval history is unremarkable. Physical exam is normal. Lab results are in normal range. Reviewed record: last lipid panel in Jan '07 with LDL 110, HDL 63.4 on no medication. Current with colorectal cancer screening with last study October '11. Immunizations: Tdap today; Pneumovax Aug '07. She is current with mammography. 12 lead EKG without signs of ischemia or injury  In summary - a very nice woman who is medically stable. She is advised to continue to garden and remain active. She will check her insurance coverage for the shingles vaccine and let us know if she is covered. She will return as needed on in 1 year.

## 2011-04-23 LAB — I-STAT 8, (EC8 V) (CONVERTED LAB)
Acid-Base Excess: 4 — ABNORMAL HIGH
Chloride: 101
HCT: 39
Operator id: 198171
Potassium: 3 — ABNORMAL LOW

## 2011-04-23 LAB — DIFFERENTIAL
Lymphocytes Relative: 27
Monocytes Absolute: 0.3
Monocytes Relative: 12 — ABNORMAL HIGH
Neutro Abs: 1.7

## 2011-04-23 LAB — URINALYSIS, ROUTINE W REFLEX MICROSCOPIC
Bilirubin Urine: NEGATIVE
Glucose, UA: NEGATIVE
Ketones, ur: NEGATIVE
Nitrite: NEGATIVE
pH: 7

## 2011-04-23 LAB — CBC
Hemoglobin: 11.5 — ABNORMAL LOW
RBC: 4.08

## 2011-04-23 LAB — URINE MICROSCOPIC-ADD ON

## 2011-04-23 LAB — POCT I-STAT CREATININE: Creatinine, Ser: 0.9

## 2012-04-19 ENCOUNTER — Encounter: Payer: Self-pay | Admitting: Internal Medicine

## 2012-04-19 ENCOUNTER — Ambulatory Visit (INDEPENDENT_AMBULATORY_CARE_PROVIDER_SITE_OTHER): Payer: Medicare Other | Admitting: Internal Medicine

## 2012-04-19 VITALS — BP 134/80 | HR 78 | Temp 98.9°F | Resp 16 | Wt 106.0 lb

## 2012-04-19 DIAGNOSIS — K5733 Diverticulitis of large intestine without perforation or abscess with bleeding: Secondary | ICD-10-CM

## 2012-04-19 DIAGNOSIS — Z23 Encounter for immunization: Secondary | ICD-10-CM

## 2012-04-19 MED ORDER — AMOXICILLIN-POT CLAVULANATE 875-125 MG PO TABS: 1.0000 | ORAL_TABLET | Freq: Two times a day (BID) | ORAL | Status: DC

## 2012-04-19 NOTE — Patient Instructions (Addendum)
Abdominal pain with change in stool with blood and mucus with left lower quadrant tenderness is most consistent with Diverticulitis. Plan - Augmentin twice a day for 10 days.   No Pepto-bismal  No diet restrictions  For persistent pain, fever, inability to take meds - call  Diverticulitis A diverticulum is a small pouch or sac on the colon. Diverticulosis is the presence of these diverticula on the colon. Diverticulitis is the irritation (inflammation) or infection of diverticula. CAUSES   The colon and its diverticula contain bacteria. If food particles block the tiny opening to a diverticulum, the bacteria inside can grow and cause an increase in pressure. This leads to infection and inflammation and is called diverticulitis. SYMPTOMS    Abdominal pain and tenderness. Usually, the pain is located on the left side of your abdomen. However, it could be located elsewhere.   Fever.   Bloating.   Feeling sick to your stomach (nausea).   Throwing up (vomiting).   Abnormal stools.  DIAGNOSIS   Your caregiver will take a history and perform a physical exam. Since many things can cause abdominal pain, other tests may be necessary. Tests may include:  Blood tests.   Urine tests.   X-ray of the abdomen.   CT scan of the abdomen.  Sometimes, surgery is needed to determine if diverticulitis or other conditions are causing your symptoms. TREATMENT   Most of the time, you can be treated without surgery. Treatment includes:  Resting the bowels by only having liquids for a few days. As you improve, you will need to eat a low-fiber diet.   Intravenous (IV) fluids if you are losing body fluids (dehydrated).   Antibiotic medicines that treat infections may be given.   Pain and nausea medicine, if needed.   Surgery if the inflamed diverticulum has burst.  HOME CARE INSTRUCTIONS    Try a clear liquid diet (broth, tea, or water for as long as directed by your caregiver). You may then  gradually begin a low-fiber diet as tolerated. A low-fiber diet is a diet with less than 10 grams of fiber. Choose the foods below to reduce fiber in the diet:   White breads, cereals, rice, and pasta.   Cooked fruits and vegetables or soft fresh fruits and vegetables without the skin.   Ground or well-cooked tender beef, ham, veal, lamb, pork, or poultry.   Eggs and seafood.   After your diverticulitis symptoms have improved, your caregiver may put you on a high-fiber diet. A high-fiber diet includes 14 grams of fiber for every 1000 calories consumed. For a standard 2000 calorie diet, you would need 28 grams of fiber. Follow these diet guidelines to help you increase the fiber in your diet. It is important to slowly increase the amount fiber in your diet to avoid gas, constipation, and bloating.   Choose whole-grain breads, cereals, pasta, and brown rice.   Choose fresh fruits and vegetables with the skin on. Do not overcook vegetables because the more vegetables are cooked, the more fiber is lost.   Choose more nuts, seeds, legumes, dried peas, beans, and lentils.   Look for food products that have greater than 3 grams of fiber per serving on the Nutrition Facts label.   Take all medicine as directed by your caregiver.   If your caregiver has given you a follow-up appointment, it is very important that you go. Not going could result in lasting (chronic) or permanent injury, pain, and disability. If there is any  problem keeping the appointment, call to reschedule.  SEEK MEDICAL CARE IF:    Your pain does not improve.   You have a hard time advancing your diet beyond clear liquids.   Your bowel movements do not return to normal.  SEEK IMMEDIATE MEDICAL CARE IF:    Your pain becomes worse.   You have an oral temperature above 102 F (38.9 C), not controlled by medicine.   You have repeated vomiting.   You have bloody or black, tarry stools.   Symptoms that brought you to your  caregiver become worse or are not getting better.  MAKE SURE YOU:    Understand these instructions.   Will watch your condition.   Will get help right away if you are not doing well or get worse.  Document Released: 04/08/2005 Document Revised: 09/21/2011 Document Reviewed: 08/04/2010 [facility] Patient Information 2013 [place], [place].

## 2012-04-20 NOTE — Progress Notes (Signed)
  Subjective:    Patient ID: Margaret Nguyen, female    DOB: 03-23-1941, 71 y.o.   MRN: [id number]  HPI Margaret Nguyen presents for a change in bowel habit with loose stools with small amount of blood and mucus. She has not had any antibiotics for > 6 months. She denies any fevers or chills. She is current with colonoscopy with a normal study except for diverticular changes in October of '11. On close questioning she does admit to mild abdominal pain.  Past Medical History  Diagnosis Date  . Villous adenoma of colon   . Hiatal hernia   . Family hx colonic polyps   . Hypertension    Past Surgical History  Procedure Date  . Sigmoid colectomy for villous adenoma   . Abdominal hysterectomy   . Tubal ligation    No family history on file. History   Social History  . Marital Status: Widowed    Spouse Name: N/A    Number of Children: N/A  . Years of Education: N/A   Occupational History  . Not on file.   Social History Main Topics  . Smoking status: Never Smoker   . Smokeless tobacco: Not on file  . Alcohol Use: Not on file  . Drug Use: Not on file  . Sexually Active: Not on file   Other Topics Concern  . Not on file   Social History Narrative   Married [phone], widowed1 son, 2 daughters, 1 son deceased5 grandchildrenLives alone; I- ADLsWorking-fulltime [employer]    Current Outpatient Prescriptions on File Prior to Visit  Medication Sig Dispense Refill  . Calcium Carb-Cholecalciferol (CALCIUM 500 +D) 500-400 MG-UNIT TABS Take by mouth.        . enalapril (VASOTEC) 20 MG tablet Take 1 tablet (20 mg total) by mouth daily.  90 tablet  3  . fish oil-omega-3 fatty acids 1000 MG capsule Take 2 g by mouth daily.        . hydrochlorothiazide 25 MG tablet Take 1 tablet (25 mg total) by mouth daily.  90 tablet  3  . lidocaine-hydrocortisone (ANAMANTLE HC) 3-0.5 % CREA Place 1 Applicatorful rectally.        . Multiple Vitamins-Minerals (CENTRUM PO) Take by mouth.             Review of Systems .menrosb     Objective:   Physical Exam Filed Vitals:   04/19/12 1611  BP: 134/80  Pulse: 78  Temp: 98.9 F (37.2 C)  Resp: 16   Wt Readings from Last 3 Encounters:  04/19/12 106 lb (48.081 kg)  01/22/11 108 lb (48.988 kg)  11/06/10 111 lb (50.349 kg)   Gen'l- small framed thin AA woman in no acute distress HEENT_ C&S clear Cor- RRR' Pulm - normal, unlabored respirations Abd - BS+, no guarding, point of maximum, tenderness LLQ       Assessment & Plan:  Abdominal pain with change in stool with blood and mucus with left lower quadrant tenderness is most consistent with Diverticulitis. Plan - Augmentin twice a day for 10 days.   No Pepto-bismal  No diet restrictions  For persistent pain, fever, inability to take meds - call

## 2012-04-29 ENCOUNTER — Telehealth: Payer: Self-pay | Admitting: Internal Medicine

## 2012-04-29 MED ORDER — FLUCONAZOLE 150 MG PO TABS: 150.0000 mg | ORAL_TABLET | Freq: Once | ORAL | Status: DC

## 2012-04-29 NOTE — Telephone Encounter (Signed)
done

## 2012-04-29 NOTE — Telephone Encounter (Signed)
Caller: [name]/Patient; Patient Name: Margaret Nguyen; PCP: Illene Regulus (Adults only); Best Callback Phone Number: [phone] Calling about starting  Antibiotic on 04/26/12 for Diverticulitis and now has yeast sx of itching and irritation onset- 04/27/12. No discharge. Requesting Diflucan be called into [facility] in [place] [phone]. Triage and Care advice per Vaginal Discharge or Irritation Protocol. Please have Pharmacy call her if med is called in.

## 2012-07-14 ENCOUNTER — Other Ambulatory Visit: Payer: Self-pay | Admitting: Internal Medicine

## 2013-02-24 ENCOUNTER — Encounter: Payer: Self-pay | Admitting: Gastroenterology

## 2013-04-27 ENCOUNTER — Other Ambulatory Visit: Payer: Self-pay | Admitting: Internal Medicine

## 2013-09-12 ENCOUNTER — Telehealth: Payer: Self-pay | Admitting: *Deleted

## 2013-09-12 NOTE — Telephone Encounter (Signed)
Patient phoned requesting to speak to PCP.  Would not give specifics to her needs, just "i need to speak to Dr. Linda Hedges"  CB# [phone]

## 2013-09-12 NOTE — Telephone Encounter (Signed)
Patient phoned triage line again requesting to speak with Dr. Linda Hedges.  She wants to speak with YOU, and only you.

## 2013-09-29 ENCOUNTER — Encounter: Payer: Self-pay | Admitting: Gastroenterology

## 2013-11-29 ENCOUNTER — Telehealth: Payer: Self-pay | Admitting: Family Medicine

## 2013-11-29 MED ORDER — HYDROCHLOROTHIAZIDE 25 MG PO TABS: ORAL_TABLET | ORAL | Status: DC

## 2013-11-29 MED ORDER — ENALAPRIL MALEATE 20 MG PO TABS: ORAL_TABLET | ORAL | Status: DC

## 2013-11-29 NOTE — Telephone Encounter (Signed)
plz notify pt meds refilled for next 3 mo. May make appt with me in next few months - in 30 min appt.

## 2013-11-29 NOTE — Telephone Encounter (Signed)
Pt came in wanting to make appt with Dr Darnell Level. She is former pt of Dr Crissie Sickles. She was refferred to Dr Darnell Level. by Dr Crissie Sickles and wants to establish her care here. Will you accept her as a new pt? If so, she has a blood pressure medication that she will be out of this Friday and will need a refill.  Pease advise, Thank you!

## 2013-11-30 NOTE — Telephone Encounter (Signed)
Patient notified and appt scheduled.

## 2013-12-28 NOTE — Telephone Encounter (Signed)
Pt requesting refills on BP meds; spoke with Rhea and refills available; Heron Nay will get refills ready. Pt notified to ck with pharmacy to pick up refills. Pt voiced understanding.

## 2014-01-16 ENCOUNTER — Telehealth: Payer: Self-pay | Admitting: Family Medicine

## 2014-01-16 ENCOUNTER — Ambulatory Visit (INDEPENDENT_AMBULATORY_CARE_PROVIDER_SITE_OTHER): Payer: Medicare Other | Admitting: Family Medicine

## 2014-01-16 ENCOUNTER — Encounter: Payer: Self-pay | Admitting: Family Medicine

## 2014-01-16 VITALS — BP 124/78 | HR 74 | Temp 98.2°F | Ht 63.0 in | Wt 102.0 lb

## 2014-01-16 DIAGNOSIS — M810 Age-related osteoporosis without current pathological fracture: Secondary | ICD-10-CM

## 2014-01-16 DIAGNOSIS — M5431 Sciatica, right side: Secondary | ICD-10-CM | POA: Insufficient documentation

## 2014-01-16 DIAGNOSIS — M5430 Sciatica, unspecified side: Secondary | ICD-10-CM

## 2014-01-16 DIAGNOSIS — I10 Essential (primary) hypertension: Secondary | ICD-10-CM

## 2014-01-16 MED ORDER — ENALAPRIL MALEATE 20 MG PO TABS: ORAL_TABLET | ORAL | Status: DC

## 2014-01-16 MED ORDER — HYDROCHLOROTHIAZIDE 25 MG PO TABS: ORAL_TABLET | ORAL | Status: DC

## 2014-01-16 NOTE — Telephone Encounter (Signed)
Relevant patient education mailed to patient.  

## 2014-01-16 NOTE — Assessment & Plan Note (Signed)
Meds refilled today. Pt tolerating regimen well, continue.

## 2014-01-16 NOTE — Progress Notes (Signed)
BP 124/78  Pulse 74  Temp(Src) 98.2 F (36.8 C) (Oral)  Ht 5\' 3"  (1.6 m)  Wt 102 lb (46.267 kg)  BMI 18.07 kg/m2  SpO2 98%   CC: transfer of care from Dr Linda Hedges  Subjective:    Patient ID: Margaret Nguyen, female    DOB: 03-10-1941, 73 y.o.   MRN: [id number]  HPI: Margaret Nguyen is a 73 y.o. female presenting on 01/16/2014 for [place] pt to establish care. Prior saw Dr. Linda Hedges but lives nearby.  HTN - compliant with enalapril and hctz.  osteoporosis - on cal/vit D daily. Has never been on other med for osteoporosis. h/o GERD but not on meds. No current sxs.  Suffering R buttock pain - [facility] mid 12/2013 with R hip pain, treated with steroid shot and soma. No radiation down leg, no numbness/weakness of leg. Worse with sitting on commode. No BM changes.  Using asper cream which may help some. No bowel/bladder incontinence  Preventative: Due next visit Flu 05/2013 Pneumovax 02/2006 Tdap 01/2011   Past Medical History  Diagnosis Date  . Villous adenoma of colon   . Hiatal hernia   . Family hx colonic polyps   . Hypertension   . Diverticulitis   . Arthritis   . Osteoporosis 08/2009    R femur -2.5   Past Surgical History  Procedure Laterality Date  . Laparoscopic sigmoid colectomy      villous adenoma  . Abdominal hysterectomy    . Tubal ligation    . Colonoscopy  04/2010    polyps, diverticulosis, hem, rec rpt 3 yrs [facility] Plan)  . Dexa  08/2009    femur -2.5    Relevant past medical, surgical, family and social history reviewed and updated as indicated.  Allergies and medications reviewed and updated. Current Outpatient Prescriptions on File Prior to Visit  Medication Sig  . Calcium Carb-Cholecalciferol (CALCIUM 500 +D) 500-400 MG-UNIT TABS Take by mouth.    . fish oil-omega-3 fatty acids 1000 MG capsule Take 1 g by mouth daily.   Marland Kitchen lidocaine-hydrocortisone (ANAMANTLE HC) 3-0.5 % CREA Place 1 Applicatorful rectally.    . Multiple  Vitamins-Minerals (CENTRUM PO) Take by mouth.     No current facility-administered medications on file prior to visit.    Review of Systems Per HPI unless specifically indicated above    Objective:    BP 124/78  Pulse 74  Temp(Src) 98.2 F (36.8 C) (Oral)  Ht 5\' 3"  (1.6 m)  Wt 102 lb (46.267 kg)  BMI 18.07 kg/m2  SpO2 98%  Physical Exam  Nursing note and vitals reviewed. Constitutional: She appears well-developed and well-nourished. No distress.  Cardiovascular: Normal rate, regular rhythm and intact distal pulses.   Murmur (3/6 systolic murmur at L sternal border) heard. Pulmonary/Chest: Effort normal and breath sounds normal. No respiratory distress. She has no wheezes. She has no rales.  Abdominal: Soft. Bowel sounds are normal. She exhibits no distension and no mass. There is no tenderness. There is no rebound and no guarding.  Musculoskeletal: She exhibits no edema.  No pain midline spine No paraspinous mm tenderness Neg SLR bilaterally. No pain with int/ext rotation at hip. Neg FABER. No pain at SIJ, GTB bilaterally. Tender at R sciatic notch       Assessment & Plan:   Problem List Items Addressed This Visit   Right sided sciatica - Primary     Exam and story consistent with R piriformis  syndrome. Recently completed treatment with what sounds like steroid shot and soma course. Will provide pt with sciatica syndrome exercises from [facility] pt advisor and rec may use aleve sparingly for discomfort. If no better, consider PT referral. Pt agrees with plan.    Osteoporosis     Reviewed cal/vit D dosing. Recheck DEXA at medicare wellness visit    Relevant Medications      Cholecalciferol (VITAMIN D3) 1000 UNITS CAPS   HYPERTENSION     Meds refilled today. Pt tolerating regimen well, continue.    Relevant Medications      enalapril (VASOTEC) tablet      hydrochlorothiazide tablet       Follow up plan: Return in about 3 months (around 04/18/2014), or as needed, for  medicare wellness visit.

## 2014-01-16 NOTE — Patient Instructions (Addendum)
Schedule colonoscopy at your convenience. I think you have sciatica on the right - try aleve as needed for discomfort and start stretching exercises provided today - let me know if not improving with this. We may recheck bone density scan next physical. Return in 3-4 months for medicare wellness visit. Good to meet you today, call us with questions.

## 2014-01-16 NOTE — Progress Notes (Signed)
Pre visit review using our clinic review tool, if applicable. No additional management support is needed unless otherwise documented below in the visit note. 

## 2014-01-16 NOTE — Assessment & Plan Note (Signed)
Reviewed cal/vit D dosing. Recheck DEXA at [facility] visit

## 2014-01-16 NOTE — Assessment & Plan Note (Signed)
Exam and story consistent with R piriformis syndrome. Recently completed treatment with what sounds like steroid shot and soma course. Will provide pt with sciatica syndrome exercises from [facility] pt advisor and rec may use aleve sparingly for discomfort. If no better, consider PT referral. Pt agrees with plan.

## 2014-02-28 ENCOUNTER — Encounter: Payer: Self-pay | Admitting: Gastroenterology

## 2014-04-12 HISTORY — PX: COLONOSCOPY: SHX174

## 2014-04-19 ENCOUNTER — Ambulatory Visit (AMBULATORY_SURGERY_CENTER): Payer: Self-pay

## 2014-04-19 ENCOUNTER — Telehealth: Payer: Self-pay | Admitting: Gastroenterology

## 2014-04-19 VITALS — Ht 62.0 in | Wt 110.0 lb

## 2014-04-19 DIAGNOSIS — Z860100 Personal history of colon polyps, unspecified: Secondary | ICD-10-CM

## 2014-04-19 MED ORDER — MOVIPREP 100 G PO SOLR: 1.0000 | Freq: Once | ORAL | Status: DC

## 2014-04-19 NOTE — Telephone Encounter (Signed)
miralax prep sent via fax to pharmacy and discussed with pt via telephone

## 2014-04-19 NOTE — Progress Notes (Signed)
No allergies to eggs or soy No home oxygen No past problems with anesthesia No diet/weight loss meds  No email 

## 2014-05-03 ENCOUNTER — Ambulatory Visit (AMBULATORY_SURGERY_CENTER): Payer: Medicare Other | Admitting: Gastroenterology

## 2014-05-03 ENCOUNTER — Encounter: Payer: Self-pay | Admitting: Gastroenterology

## 2014-05-03 VITALS — BP 127/58 | HR 64 | Temp 97.4°F | Resp 32 | Ht 63.0 in | Wt 102.0 lb

## 2014-05-03 DIAGNOSIS — D125 Benign neoplasm of sigmoid colon: Secondary | ICD-10-CM

## 2014-05-03 DIAGNOSIS — D124 Benign neoplasm of descending colon: Secondary | ICD-10-CM

## 2014-05-03 DIAGNOSIS — D128 Benign neoplasm of rectum: Secondary | ICD-10-CM

## 2014-05-03 DIAGNOSIS — Z86010 Personal history of colonic polyps: Secondary | ICD-10-CM

## 2014-05-03 MED ORDER — SODIUM CHLORIDE 0.9 % IV SOLN: 500.0000 mL | INTRAVENOUS | Status: DC

## 2014-05-03 NOTE — Progress Notes (Signed)
Called to room to assist during endoscopic procedure.  Patient ID and intended procedure confirmed with present staff. Received instructions for my participation in the procedure from the performing physician.  

## 2014-05-03 NOTE — Progress Notes (Signed)
Patient requests that she be able to keep on her hat.

## 2014-05-03 NOTE — Patient Instructions (Signed)
YOU HAD AN ENDOSCOPIC PROCEDURE TODAY AT [facility]: Refer to the procedure report that was given to you for any specific questions about what was found during the examination.  If the procedure report does not answer your questions, please call your gastroenterologist to clarify.  If you requested that your care partner not be given the details of your procedure findings, then the procedure report has been included in a sealed envelope for you to review at your convenience later.  YOU SHOULD EXPECT: Some feelings of bloating in the abdomen. Passage of more gas than usual.  Walking can help get rid of the air that was put into your GI tract during the procedure and reduce the bloating. If you had a lower endoscopy (such as a colonoscopy or flexible sigmoidoscopy) you may notice spotting of blood in your stool or on the toilet paper. If you underwent a bowel prep for your procedure, then you may not have a normal bowel movement for a few days.  DIET: Your first meal following the procedure should be a light meal and then it is ok to progress to your normal diet.  A half-sandwich or bowl of soup is an example of a good first meal.  Heavy or fried foods are harder to digest and may make you feel nauseous or bloated.  Likewise meals heavy in dairy and vegetables can cause extra gas to form and this can also increase the bloating.  Drink plenty of fluids but you should avoid alcoholic beverages for 24 hours.Try to eat a high fiber diet due to your Diverticulosis.  ACTIVITY: Your care partner should take you home directly after the procedure.  You should plan to take it easy, moving slowly for the rest of the day.  You can resume normal activity the day after the procedure however you should NOT DRIVE or use heavy machinery for 24 hours (because of the sedation medicines used during the test).    SYMPTOMS TO REPORT IMMEDIATELY: A gastroenterologist can be reached at any hour.  During normal  business hours, 8:30 AM to 5:00 PM Monday through Friday, call [phone].  After hours and on weekends, please call the GI answering service at [phone] who will take a message and have the physician on call contact you.   Following lower endoscopy (colonoscopy or flexible sigmoidoscopy):  Excessive amounts of blood in the stool  Significant tenderness or worsening of abdominal pains  Swelling of the abdomen that is new, acute  Fever of 100F or higher  FOLLOW UP: If any biopsies were taken you will be contacted by phone or by letter within the next 1-3 weeks.  Call your gastroenterologist if you have not heard about the biopsies in 3 weeks.  Our staff will call the home number listed on your records the next business day following your procedure to check on you and address any questions or concerns that you may have at that time regarding the information given to you following your procedure. This is a courtesy call and so if there is no answer at the home number and we have not heard from you through the emergency physician on call, we will assume that you have returned to your regular daily activities without incident.  SIGNATURES/CONFIDENTIALITY: You and/or your care partner have signed paperwork which will be entered into your electronic medical record.  These signatures attest to the fact that that the information above on your After Visit Summary has been reviewed and  is understood.  Full responsibility of the confidentiality of this discharge information lies with you and/or your care-partner.  Try to read the handouts given to you by your recovery room nurse.

## 2014-05-03 NOTE — Op Note (Signed)
[facility]  [address] [place], [id number]   COLONOSCOPY PROCEDURE REPORT  PATIENT: Karyna, Bessler  MR#: [id number] BIRTHDATE: [birth year] , 73  yrs. old GENDER: female ENDOSCOPIST: Ladene Artist, MD, [facility] PROCEDURE DATE:  05/03/2014 PROCEDURE:   Colonoscopy with biopsy, Colonoscopy with snare polypectomy, and Colonoscopy with control of bleeding First Screening Colonoscopy - Avg.  risk and is 50 yrs.  old or older - No.  Prior Negative Screening - Now for repeat screening. N/A  History of Adenoma - Now for follow-up colonoscopy & has been > or = to 3 yrs.  Yes hx of adenoma.  Has been 3 or more years since last colonoscopy.  Polyps Removed Today? Yes. ASA CLASS:   Class II INDICATIONS:surveillance colonoscopy based on a history of adenomatous colonic polyp(s). MEDICATIONS: Monitored anesthesia care and Propofol 170 mg IV DESCRIPTION OF PROCEDURE:   After the risks benefits and alternatives of the procedure were thoroughly explained, informed consent was obtained.  The digital rectal exam revealed no abnormalities of the rectum.   The LB [id number]-[id number]  endoscope was introduced through the anus and advanced to the cecum, which was identified by both the appendix and ileocecal valve. No adverse events experienced.   The quality of the prep was excellent, using MoviPrep  The instrument was then slowly withdrawn as the colon was fully examined.    COLON FINDINGS: Two sessile polyps measuring 6 mm in size were found in the sigmoid colon and descending colon.  A polypectomy was performed with a cold snare.  The resection was complete, the polyp tissue was completely retrieved and sent to histology.   A sessile polyp measuring 4 mm in size was found in the distal sigmoid colon. A polypectomy was performed with cold forceps. Persistent bleeding at the polypectomy site.  1 Endoclip applied and hemostasis achieved. The resection was complete, the polyp  tissue was completely retrieved and sent to histology.  A semi-pedunculated polyp measuring 14 mm in size was found in the rectum.  A polypectomy was performed using snare cautery.  The resection was complete, the polyp tissue was completely retrieved and sent to histology.  There was moderate diverticulosis noted in the descending colon.  There was mild diverticulosis noted in the transverse colon and ascending colon.   There was evidence of a prior colo-colonic surgical anastomosis in the sigmoid colon.   The examination was otherwise normal.  Retroflexed views revealed no abnormalities. The time to cecum=1 minutes 41 seconds.  Withdrawal time=11 minutes 21 seconds.  The scope was withdrawn and the procedure completed.  COMPLICATIONS: There were no immediate complications.  ENDOSCOPIC IMPRESSION: 1.   Two sessile polyps in the sigmoid colon and descending colon; polypectomy performed with a cold snare 3.   Sessile polyp in the distal sigmoid colon; polypectomy performed with cold forceps 4.   Semi-pedunculated polyp in the rectum; polypectomy performed using snare cautery; endoclip applied for bleeding 5.   Moderate diverticulosis in the descending colon 6.   Mild diverticulosis in the transverse colon and ascending colon  7.   Prior colo-colonic surgical anastomosis in the sigmoid colon    RECOMMENDATIONS: 1.  Hold Aspirin and all other NSAIDS for 2 weeks. 2.  Await pathology results 3.  High fiber diet with liberal fluid intake. 4.  Repeat Colonoscopy in 3 years pending pathology review  eSigned:  Ladene Artist, MD, [facility] 05/03/2014 10:40 AM      PATIENT NAME:  Jefferey Pica  MR#: [id number]

## 2014-05-03 NOTE — Progress Notes (Signed)
Report to PACU, RN, vss, BBS= Clear.  

## 2014-05-04 ENCOUNTER — Telehealth: Payer: Self-pay

## 2014-05-04 NOTE — Telephone Encounter (Signed)
  Follow up Call-  Call back number 05/03/2014  Post procedure Call Back phone  # [phone]  Permission to leave phone message Yes     Patient questions:  Do you have a fever, pain , or abdominal swelling? No. Pain Score  0 *  Have you tolerated food without any problems? Yes.    Have you been able to return to your normal activities? Yes.    Do you have any questions about your discharge instructions: Diet   No. Medications  No. Follow up visit  No.  Do you have questions or concerns about your Care? No.  Actions: * If pain score is 4 or above: No action needed, pain <4.

## 2014-05-08 ENCOUNTER — Encounter: Payer: Self-pay | Admitting: Gastroenterology

## 2014-05-09 ENCOUNTER — Encounter: Payer: Self-pay | Admitting: Family Medicine

## 2014-07-02 ENCOUNTER — Ambulatory Visit (INDEPENDENT_AMBULATORY_CARE_PROVIDER_SITE_OTHER): Payer: Medicare Other

## 2014-07-02 DIAGNOSIS — Z23 Encounter for immunization: Secondary | ICD-10-CM

## 2015-02-07 ENCOUNTER — Other Ambulatory Visit: Payer: Self-pay | Admitting: Family Medicine

## 2015-03-08 ENCOUNTER — Ambulatory Visit (INDEPENDENT_AMBULATORY_CARE_PROVIDER_SITE_OTHER): Payer: Medicare Other | Admitting: Family Medicine

## 2015-03-08 ENCOUNTER — Encounter: Payer: Self-pay | Admitting: Family Medicine

## 2015-03-08 VITALS — BP 140/70 | HR 75 | Temp 97.7°F | Wt 105.0 lb

## 2015-03-08 DIAGNOSIS — I10 Essential (primary) hypertension: Secondary | ICD-10-CM

## 2015-03-08 DIAGNOSIS — M810 Age-related osteoporosis without current pathological fracture: Secondary | ICD-10-CM | POA: Diagnosis not present

## 2015-03-08 DIAGNOSIS — J3489 Other specified disorders of nose and nasal sinuses: Secondary | ICD-10-CM | POA: Diagnosis not present

## 2015-03-08 DIAGNOSIS — Z23 Encounter for immunization: Secondary | ICD-10-CM | POA: Diagnosis not present

## 2015-03-08 DIAGNOSIS — D72819 Decreased white blood cell count, unspecified: Secondary | ICD-10-CM | POA: Diagnosis not present

## 2015-03-08 MED ORDER — HYDROCHLOROTHIAZIDE 25 MG PO TABS: ORAL_TABLET | ORAL | Status: DC

## 2015-03-08 MED ORDER — MUPIROCIN CALCIUM 2 % EX CREA: 1.0000 "application " | TOPICAL_CREAM | Freq: Two times a day (BID) | CUTANEOUS | Status: DC

## 2015-03-08 MED ORDER — ENALAPRIL MALEATE 20 MG PO TABS: ORAL_TABLET | ORAL | Status: DC

## 2015-03-08 NOTE — Assessment & Plan Note (Signed)
None currently but endorses worsening when heater comes on. Anticipate overly dry nose. Refilled bactroban, will also rec nasal saline or vaseline.

## 2015-03-08 NOTE — Progress Notes (Signed)
Pre visit review using our clinic review tool, if applicable. No additional management support is needed unless otherwise documented below in the visit note. 

## 2015-03-08 NOTE — Patient Instructions (Addendum)
I've refilled your blood pressure medicines today. I will refill bactroban antibiotic ointment for nose.  When allergies flare up, take plain allegra (fexofenadine) 1 pill daily. For cough may try plain delsym (without decongestant).  Good to see you today, call us with questions. Flu shot today. labwork today.  Call to schedule mammogram or let us know if you'd like [place] to schedule. Think about repeating bone density scan. Return in 6 months for medicare wellness visit.

## 2015-03-08 NOTE — Assessment & Plan Note (Signed)
Chronic, stable. Continue hctz, enalapril daily.

## 2015-03-08 NOTE — Assessment & Plan Note (Signed)
Reviewed with patient, discussed rpt DEXA, pt will consider. Discussed bone strengthening therapy.

## 2015-03-08 NOTE — Progress Notes (Signed)
BP 140/70 mmHg  Pulse 75  Temp(Src) 97.7 F (36.5 C) (Tympanic)  Wt 105 lb (47.628 kg)  SpO2 97%   CC: med refill visit  Subjective:    Patient ID: Margaret Nguyen, female    DOB: [birth year], 74 y.o.   MRN: [id number]  HPI: Margaret Nguyen is a 74 y.o. female presenting on 03/08/2015 for Medication Refill   Here for med refill visit. Last seen 01/2014. Did not return for medicare wellness visit  HTN - Compliant with current antihypertensive regimen of enalapril and hctz daily.  Does not check blood pressures at home. No low blood pressure symptoms of dizziness/syncope. Denies HA, vision changes, CP/tightness, SOB, leg swelling.    Persistent lower back pain. Self treats with rubbing back.   Gets sores in nose with weather changes. Requests bactroban refill.   osteoporosis - on cal/vit D daily. Has never been on other med for osteoporosis. h/o GERD but not on meds. No current sxs.  Preventative: Due for this COLONOSCOPY Date: 04/2014 mult TA, one with high grade dysplasia, mod diverticulosis, rpt 5 yrs Fuller Plan) DEXA Date: 08/2009 femur -2.5. Declines repeat.  Flu today Pneumovax 02/2006 Tdap 01/2011  Relevant past medical, surgical, family and social history reviewed and updated as indicated. Interim medical history since our last visit reviewed. Allergies and medications reviewed and updated. Current Outpatient Prescriptions on File Prior to Visit  Medication Sig  . Calcium Carb-Cholecalciferol (CALCIUM 500 +D) 500-400 MG-UNIT TABS Take by mouth.    . Cholecalciferol (VITAMIN D3) 1000 UNITS CAPS Take 1 capsule by mouth daily.  . fish oil-omega-3 fatty acids 1000 MG capsule Take 1 g by mouth daily.   . Multiple Vitamins-Minerals (CENTRUM PO) Take by mouth.    . vitamin C (ASCORBIC ACID) 500 MG tablet Take 500 mg by mouth daily.   No current facility-administered medications on file prior to visit.    Review of Systems Per HPI unless specifically indicated  above     Objective:    BP 140/70 mmHg  Pulse 75  Temp(Src) 97.7 F (36.5 C) (Tympanic)  Wt 105 lb (47.628 kg)  SpO2 97%  Wt Readings from Last 3 Encounters:  03/08/15 105 lb (47.628 kg)  05/03/14 102 lb (46.267 kg)  04/19/14 110 lb (49.896 kg)    Physical Exam  Constitutional: She appears well-developed and well-nourished. No distress.  HENT:  Mouth/Throat: Oropharynx is clear and moist. No oropharyngeal exudate.  Cardiovascular: Normal rate, regular rhythm and intact distal pulses.   Murmur (3/6 systolic murmur at USB) heard. Pulmonary/Chest: Effort normal and breath sounds normal. No respiratory distress. She has no wheezes. She has no rales.  Musculoskeletal: She exhibits no edema.  Skin: Skin is warm and dry. No rash noted.  Psychiatric: She has a normal mood and affect.  Nursing note and vitals reviewed.  Results for orders placed or performed in visit on 01/16/14  HM DEXA SCAN  Result Value Ref Range   HM Dexa Scan osteoporosis   HM DEXA SCAN  Result Value Ref Range   HM Dexa Scan osteoporosis   HM COLONOSCOPY  Result Value Ref Range   HM Colonoscopy done       Assessment & Plan:   Problem List Items Addressed This Visit    Essential hypertension - Primary    Chronic, stable. Continue hctz, enalapril daily.      Relevant Medications   enalapril (VASOTEC) 20 MG tablet   hydrochlorothiazide (HYDRODIURIL) 25 MG tablet  Other Relevant Orders   Lipid panel   Basic metabolic panel   Osteoporosis    Reviewed with patient, discussed rpt DEXA, pt will consider. Discussed bone strengthening therapy.      Relevant Orders   Vit D  25 hydroxy (rtn osteoporosis monitoring)   Nasal sore    None currently but endorses worsening when heater comes on. Anticipate overly dry nose. Refilled bactroban, will also rec nasal saline or vaseline.       Other Visit Diagnoses    Leukopenia        Relevant Orders    CBC with Differential/Platelet        Follow up  plan: Return in about 6 months (around 09/08/2015), or as needed, for medicare wellness visit.

## 2015-03-15 ENCOUNTER — Other Ambulatory Visit (INDEPENDENT_AMBULATORY_CARE_PROVIDER_SITE_OTHER): Payer: Medicare Other

## 2015-03-15 DIAGNOSIS — M810 Age-related osteoporosis without current pathological fracture: Secondary | ICD-10-CM | POA: Diagnosis not present

## 2015-03-15 DIAGNOSIS — I10 Essential (primary) hypertension: Secondary | ICD-10-CM | POA: Diagnosis not present

## 2015-03-15 DIAGNOSIS — D72819 Decreased white blood cell count, unspecified: Secondary | ICD-10-CM

## 2015-03-15 LAB — CBC WITH DIFFERENTIAL/PLATELET
BASOS ABS: 0 10*3/uL (ref 0.0–0.1)
Basophils Relative: 0.3 % (ref 0.0–3.0)
EOS ABS: 0 10*3/uL (ref 0.0–0.7)
Eosinophils Relative: 0.6 % (ref 0.0–5.0)
HCT: 38.8 % (ref 36.0–46.0)
HEMOGLOBIN: 12.8 g/dL (ref 12.0–15.0)
LYMPHS PCT: 16.7 % (ref 12.0–46.0)
Lymphs Abs: 0.8 10*3/uL (ref 0.7–4.0)
MCHC: 33 g/dL (ref 30.0–36.0)
MCV: 90.2 fl (ref 78.0–100.0)
MONOS PCT: 11.2 % (ref 3.0–12.0)
Monocytes Absolute: 0.5 10*3/uL (ref 0.1–1.0)
Neutro Abs: 3.4 10*3/uL (ref 1.4–7.7)
Neutrophils Relative %: 71.2 % (ref 43.0–77.0)
Platelets: 286 10*3/uL (ref 150.0–400.0)
RBC: 4.3 Mil/uL (ref 3.87–5.11)
RDW: 15 % (ref 11.5–15.5)
WBC: 4.8 10*3/uL (ref 4.0–10.5)

## 2015-03-15 LAB — BASIC METABOLIC PANEL
BUN: 17 mg/dL (ref 6–23)
CO2: 31 mEq/L (ref 19–32)
CREATININE: 0.91 mg/dL (ref 0.40–1.20)
Calcium: 9.6 mg/dL (ref 8.4–10.5)
Chloride: 98 mEq/L (ref 96–112)
GFR: 77.7 mL/min (ref >60.00)
GLUCOSE: 105 mg/dL — AB (ref 70–99)
Potassium: 3 mEq/L — ABNORMAL LOW (ref 3.5–5.1)
Sodium: 143 mEq/L (ref 135–145)

## 2015-03-15 LAB — LDL CHOLESTEROL, DIRECT: Direct LDL: 42 mg/dL

## 2015-03-15 LAB — LIPID PANEL
CHOL/HDL RATIO: 2
Cholesterol: 146 mg/dL (ref 0–200)
HDL: 65.9 mg/dL (ref >39.00)
NONHDL: 79.83
TRIGLYCERIDES: 207 mg/dL — AB (ref 0.0–149.0)
VLDL: 41.4 mg/dL — AB (ref 0.0–40.0)

## 2015-03-15 LAB — VITAMIN D 25 HYDROXY (VIT D DEFICIENCY, FRACTURES): VITD: 32.04 ng/mL (ref 30.00–100.00)

## 2015-03-16 ENCOUNTER — Other Ambulatory Visit: Payer: Self-pay | Admitting: Family Medicine

## 2015-03-16 MED ORDER — POTASSIUM CHLORIDE ER 10 MEQ PO TBCR: 10.0000 meq | EXTENDED_RELEASE_TABLET | Freq: Every day | ORAL | Status: DC

## 2015-03-19 ENCOUNTER — Encounter: Payer: Self-pay | Admitting: *Deleted

## 2015-04-01 ENCOUNTER — Telehealth: Payer: Self-pay

## 2015-04-01 LAB — HM MAMMOGRAPHY

## 2015-04-01 NOTE — Telephone Encounter (Signed)
Patient refused any information on scheduling a mammogram at the moment.

## 2015-04-10 ENCOUNTER — Telehealth: Payer: Self-pay | Admitting: Family Medicine

## 2015-04-10 DIAGNOSIS — Z1239 Encounter for other screening for malignant neoplasm of breast: Secondary | ICD-10-CM

## 2015-04-10 DIAGNOSIS — M810 Age-related osteoporosis without current pathological fracture: Secondary | ICD-10-CM

## 2015-04-10 NOTE — Telephone Encounter (Signed)
Pt called to schedule bone density  No order in system  Pt would like to [facility] In am appointment She would like to do screening mammogram at same time need order

## 2015-04-10 NOTE — Telephone Encounter (Signed)
ordered

## 2015-04-11 NOTE — Telephone Encounter (Signed)
Left message asking pt to call office Please let her know  Mammogram and bone density appointment [facility] 04/26/15 @9  arrive @ 8:45  No calcium supplement the day before or the day of No deodorant lotion power No metal snaps or zippers below waist

## 2015-04-12 NOTE — Telephone Encounter (Signed)
Pt aware.

## 2015-04-13 HISTORY — PX: OTHER SURGICAL HISTORY: SHX169

## 2015-04-26 LAB — HM MAMMOGRAPHY: HM MAMMO: NORMAL

## 2015-05-01 ENCOUNTER — Encounter: Payer: Self-pay | Admitting: Family Medicine

## 2015-05-02 ENCOUNTER — Encounter: Payer: Self-pay | Admitting: *Deleted

## 2015-05-02 ENCOUNTER — Telehealth: Payer: Self-pay | Admitting: *Deleted

## 2015-05-02 MED ORDER — FLUCONAZOLE 150 MG PO TABS: 150.0000 mg | ORAL_TABLET | Freq: Once | ORAL | Status: DC

## 2015-05-02 NOTE — Telephone Encounter (Signed)
Pt left voicemail at Triage, pt said that the potassium Rx you recently prescribed her gave her a yeast infections and she is requesting a Rx be sent to pharmacy

## 2015-05-02 NOTE — Telephone Encounter (Signed)
Potassium doesn't cause yeast infection. What sxs is she having? May treat with diflucan x1. If persistent sxs recommend she return for evaluation.

## 2015-05-03 NOTE — Telephone Encounter (Signed)
Mild itching. She denied any discharge or other symptoms. She will try the diflucan and come in for eval if no help.

## 2015-05-07 ENCOUNTER — Encounter: Payer: Self-pay | Admitting: Family Medicine

## 2015-05-08 ENCOUNTER — Encounter: Payer: Self-pay | Admitting: *Deleted

## 2015-05-14 ENCOUNTER — Encounter: Payer: Self-pay | Admitting: Gastroenterology

## 2015-09-09 ENCOUNTER — Encounter: Payer: Self-pay | Admitting: Family Medicine

## 2015-09-09 ENCOUNTER — Ambulatory Visit (INDEPENDENT_AMBULATORY_CARE_PROVIDER_SITE_OTHER): Payer: Medicare Other | Admitting: Family Medicine

## 2015-09-09 VITALS — BP 144/84 | HR 80 | Temp 98.4°F | Wt 103.8 lb

## 2015-09-09 DIAGNOSIS — Z7189 Other specified counseling: Secondary | ICD-10-CM | POA: Insufficient documentation

## 2015-09-09 DIAGNOSIS — D126 Benign neoplasm of colon, unspecified: Secondary | ICD-10-CM | POA: Diagnosis not present

## 2015-09-09 DIAGNOSIS — Z418 Encounter for other procedures for purposes other than remedying health state: Secondary | ICD-10-CM

## 2015-09-09 DIAGNOSIS — Z23 Encounter for immunization: Secondary | ICD-10-CM | POA: Diagnosis not present

## 2015-09-09 DIAGNOSIS — Z0000 Encounter for general adult medical examination without abnormal findings: Secondary | ICD-10-CM | POA: Insufficient documentation

## 2015-09-09 DIAGNOSIS — M810 Age-related osteoporosis without current pathological fracture: Secondary | ICD-10-CM

## 2015-09-09 DIAGNOSIS — Z299 Encounter for prophylactic measures, unspecified: Secondary | ICD-10-CM

## 2015-09-09 DIAGNOSIS — I10 Essential (primary) hypertension: Secondary | ICD-10-CM | POA: Diagnosis not present

## 2015-09-09 DIAGNOSIS — K5730 Diverticulosis of large intestine without perforation or abscess without bleeding: Secondary | ICD-10-CM | POA: Diagnosis not present

## 2015-09-09 LAB — CBC WITH DIFFERENTIAL/PLATELET
Basophils Absolute: 0 K/uL (ref 0.0–0.1)
Basophils Relative: 0.2 % (ref 0.0–3.0)
Eosinophils Absolute: 0.1 K/uL (ref 0.0–0.7)
Eosinophils Relative: 1.2 % (ref 0.0–5.0)
HCT: 33.7 % — ABNORMAL LOW (ref 36.0–46.0)
Hemoglobin: 11 g/dL — ABNORMAL LOW (ref 12.0–15.0)
Lymphocytes Relative: 15.1 % (ref 12.0–46.0)
Lymphs Abs: 0.8 K/uL (ref 0.7–4.0)
MCHC: 32.8 g/dL (ref 30.0–36.0)
MCV: 96.5 fl (ref 78.0–100.0)
Monocytes Absolute: 0.6 K/uL (ref 0.1–1.0)
Monocytes Relative: 9.8 % (ref 3.0–12.0)
Neutro Abs: 4.1 K/uL (ref 1.4–7.7)
Neutrophils Relative %: 73.7 % (ref 43.0–77.0)
Platelets: 357 K/uL (ref 150.0–400.0)
RBC: 3.49 Mil/uL — ABNORMAL LOW (ref 3.87–5.11)
RDW: 18.1 % — ABNORMAL HIGH (ref 11.5–15.5)
WBC: 5.6 K/uL (ref 4.0–10.5)

## 2015-09-09 LAB — COMPREHENSIVE METABOLIC PANEL
ALK PHOS: 51 U/L (ref 39–117)
ALT: 10 U/L (ref 0–35)
AST: 18 U/L (ref 0–37)
Albumin: 4.5 g/dL (ref 3.5–5.2)
BILIRUBIN TOTAL: 0.4 mg/dL (ref 0.2–1.2)
BUN: 20 mg/dL (ref 6–23)
CO2: 28 mEq/L (ref 19–32)
Calcium: 10.1 mg/dL (ref 8.4–10.5)
Chloride: 102 mEq/L (ref 96–112)
Creatinine, Ser: 0.96 mg/dL (ref 0.40–1.20)
GFR: 72.95 mL/min (ref >60.00)
GLUCOSE: 111 mg/dL — AB (ref 70–99)
Potassium: 5 mEq/L (ref 3.5–5.1)
SODIUM: 138 meq/L (ref 135–145)
TOTAL PROTEIN: 8 g/dL (ref 6.0–8.3)

## 2015-09-09 LAB — TSH: TSH: 3.88 u[IU]/mL (ref 0.35–4.50)

## 2015-09-09 MED ORDER — ACETAMINOPHEN 325 MG PO TABS
500.0000 mg | ORAL_TABLET | Freq: Once | ORAL | Status: AC
  Administered 2015-09-09: 487.5 mg via ORAL

## 2015-09-09 MED ORDER — ALENDRONATE SODIUM 70 MG PO TABS: 70.0000 mg | ORAL_TABLET | ORAL | Status: DC

## 2015-09-09 NOTE — Addendum Note (Signed)
Addended by: Royann Shivers A on: 09/09/2015 01:04 PM   Modules accepted: Orders

## 2015-09-09 NOTE — Progress Notes (Signed)
Pre visit review using our clinic review tool, if applicable. No additional management support is needed unless otherwise documented below in the visit note. 

## 2015-09-09 NOTE — Progress Notes (Signed)
BP 144/84 mmHg  Pulse 80  Temp(Src) 98.4 F (36.9 C) (Oral)  Wt 103 lb 12 oz (47.061 kg)   CC: medicare wellness vist  Subjective:    Patient ID: Margaret Nguyen, female    DOB: Oct 31, 1940, 75 y.o.   MRN: [id number]  HPI: Margaret Nguyen is a 75 y.o. female presenting on 09/09/2015 for Annual Exam   Osteoporosis - DEXA T -2.5 (2011) to -3.0 (2016). Prior resistant to bone strengthening medication. Agrees today. No upcoming dental work.   Stool urgency since colonoscopy.   Hearing screen - passed Vision screen - passed Fall risk screen - passed Depression screen - passed  Preventative: COLONOSCOPY Date: 04/2014 mult TA, one with high grade dysplasia, mod diverticulosis, rpt 3 yrs Fuller Plan) Lung cancer screening - not candidate Breast cancer screening - WNL 04/2015. She doesn't do breast exams at home.  Well woman exam - s/p partial hysterectomy. Ovaries remain.  DEXA Date: 04/2015 T -3 hips, -1.8 . Discussed bisphosphonate - pt willing to try. Flu shot -yearly (02/2015) Pneumovax 02/2006, prevnar DUE today Tdap 01/2011 Shingles shot - declines due to financial reasons Advanced directive discussion - Discussed. Advanced directive packet provided today. Seat belt use discussed No changing moles on skin.  Married [phone], widowed 1 son, 2 daughters, 1 son deceased 5 grandchildren Lives alone; I- ADLs Retired, used to work [employer] Activity: some walking  Relevant past medical, surgical, family and social history reviewed and updated as indicated. Interim medical history since our last visit reviewed. Allergies and medications reviewed and updated. Current Outpatient Prescriptions on File Prior to Visit  Medication Sig  . Calcium Carb-Cholecalciferol (CALCIUM 500 +D) 500-400 MG-UNIT TABS Take by mouth.    . Cholecalciferol (VITAMIN D3) 1000 UNITS CAPS Take 1 capsule by mouth daily.  . enalapril (VASOTEC) 20 MG tablet Take one tablet every day  . fish  oil-omega-3 fatty acids 1000 MG capsule Take 1 g by mouth daily.   . hydrochlorothiazide (HYDRODIURIL) 25 MG tablet Take one tablet every morning  . Multiple Vitamins-Minerals (CENTRUM PO) Take by mouth.    . mupirocin cream (BACTROBAN) 2 % Apply 1 application topically 2 (two) times daily.  . potassium chloride (K-DUR) 10 MEQ tablet Take 1 tablet (10 mEq total) by mouth daily.  . vitamin C (ASCORBIC ACID) 500 MG tablet Take 500 mg by mouth daily.   No current facility-administered medications on file prior to visit.    Review of Systems Per HPI unless specifically indicated in ROS section     Objective:    BP 144/84 mmHg  Pulse 80  Temp(Src) 98.4 F (36.9 C) (Oral)  Wt 103 lb 12 oz (47.061 kg)  Wt Readings from Last 3 Encounters:  09/09/15 103 lb 12 oz (47.061 kg)  03/08/15 105 lb (47.628 kg)  05/03/14 102 lb (46.267 kg)   Body mass index is 18.38 kg/(m^2).  Physical Exam  Constitutional: She is oriented to person, place, and time. She appears well-developed and well-nourished. No distress.  HENT:  Head: Normocephalic and atraumatic.  Right Ear: Hearing, tympanic membrane, external ear and ear canal normal.  Left Ear: Hearing, tympanic membrane, external ear and ear canal normal.  Nose: Nose normal.  Mouth/Throat: Uvula is midline, oropharynx is clear and moist and mucous membranes are normal. No oropharyngeal exudate, posterior oropharyngeal edema or posterior oropharyngeal erythema.  Eyes: Conjunctivae and EOM are normal. Pupils are equal, round, and reactive to light. No scleral icterus.  Neck: Normal  range of motion. Neck supple. Carotid bruit is not present. No thyromegaly present.  Cardiovascular: Normal rate, regular rhythm, normal heart sounds and intact distal pulses.   No murmur heard. Pulses:      Radial pulses are 2+ on the right side, and 2+ on the left side.  Pulmonary/Chest: Effort normal and breath sounds normal. No respiratory distress. She has no wheezes.  She has no rales. Right breast exhibits no inverted nipple, no mass, no nipple discharge, no skin change and no tenderness. Left breast exhibits no inverted nipple, no mass, no nipple discharge, no skin change and no tenderness.  Abdominal: Soft. Bowel sounds are normal. She exhibits no distension and no mass. There is no tenderness. There is no rebound and no guarding.  Genitourinary: Vagina normal. Pelvic exam was performed with patient supine. There is no rash, tenderness, lesion or injury on the right labia. There is no rash, tenderness, lesion or injury on the left labia. Right adnexum displays no mass. Left adnexum displays no mass.  Normal bimanual Uterus absent Ovaries not palpated  Musculoskeletal: Normal range of motion. She exhibits no edema.  Lymphadenopathy:       Head (right side): No submental, no submandibular, no tonsillar, no preauricular and no posterior auricular adenopathy present.       Head (left side): No submental, no submandibular, no tonsillar, no preauricular and no posterior auricular adenopathy present.    She has no cervical adenopathy.    She has no axillary adenopathy.       Right axillary: No lateral adenopathy present.       Left axillary: No lateral adenopathy present.      Right: No supraclavicular adenopathy present.       Left: No supraclavicular adenopathy present.  Neurological: She is alert and oriented to person, place, and time.  CN grossly intact, station and gait intact Recall 3/3  Calculation 4/5 serial 3s  Skin: Skin is warm and dry. No rash noted.  Psychiatric: She has a normal mood and affect. Her behavior is normal. Judgment and thought content normal.  Nursing note and vitals reviewed.  Results for orders placed or performed in visit on 05/02/15  HM MAMMOGRAPHY  Result Value Ref Range   HM Mammogram Normal Birads 1-Repeat 1 year       Assessment & Plan:   Problem List Items Addressed This Visit    Osteoporosis    Discussed with  patient, discussed treatment options. Pt interested in trial bisphosphonate. No planned dental work. Pt will monitor for worsening bone pains or atypical fractures.      Relevant Medications   alendronate (FOSAMAX) 70 MG tablet   Medicare annual wellness visit, subsequent - Primary    I have personally reviewed the Medicare Annual Wellness questionnaire and have noted 1. The patient's medical and social history 2. Their use of alcohol, tobacco or illicit drugs 3. Their current medications and supplements 4. The patient's functional ability including ADL's, fall risks, home safety risks and hearing or visual impairment. Cognitive function has been assessed and addressed as indicated.  5. Diet and physical activity 6. Evidence for depression or mood disorders The patients weight, height, BMI have been recorded in the chart. I have made referrals, counseling and provided education to the patient based on review of the above and I have provided the pt with a written personalized care plan for preventive services. Provider list updated.. See scanned questionairre as needed for further documentation. Reviewed preventative protocols and updated unless  pt declined.       Essential hypertension    Chronic, stable. Continue current regimen. Check labs today.      Relevant Orders   Comprehensive metabolic panel   TSH   Diverticulosis of colon without hemorrhage    Discussed dx and need to monitor for diverticulitis sxs      Advanced care planning/counseling discussion    Discussed. Advanced directive packet provided today.      Adenomatous colon polyp    S/p high grade dysplastic polyp removed (04/2014), next colonoscopy will be due 04/2017      Relevant Orders   CBC with Differential/Platelet       Follow up plan: Return in about 1 year (around 09/08/2016), or as needed, for medicare wellness visit.

## 2015-09-09 NOTE — Assessment & Plan Note (Signed)
S/p high grade dysplastic polyp removed (04/2014), next colonoscopy will be due 04/2017

## 2015-09-09 NOTE — Assessment & Plan Note (Signed)

## 2015-09-09 NOTE — Patient Instructions (Addendum)
Start fosamax for bone strength - take 1 pill weekly with glass of water on empty stomach, nothing to eat or drink afterward for 58mn-1 hr.  Labwork today.  Prevnar today.  Return as needed or in 1 year for next medicare wellness visit  Health Maintenance, Female Adopting a healthy lifestyle and getting preventive care can go a long way to promote health and wellness. Talk with your health care provider about what schedule of regular examinations is right for you. This is a good chance for you to check in with your provider about disease prevention and staying healthy. In between checkups, there are plenty of things you can do on your own. Experts have done a lot of research about which lifestyle changes and preventive measures are most likely to keep you healthy. Ask your health care provider for more information. WEIGHT AND DIET  Eat a healthy diet  Be sure to include plenty of vegetables, fruits, low-fat dairy products, and lean protein.  Do not eat a lot of foods high in solid fats, added sugars, or salt.  Get regular exercise. This is one of the most important things you can do for your health.  Most adults should exercise for at least 150 minutes each week. The exercise should increase your heart rate and make you sweat (moderate-intensity exercise).  Most adults should also do strengthening exercises at least twice a week. This is in addition to the moderate-intensity exercise.  Maintain a healthy weight  Body mass index (BMI) is a measurement that can be used to identify possible weight problems. It estimates body fat based on height and weight. Your health care provider can help determine your BMI and help you achieve or maintain a healthy weight.  For females 245years of age and older:   A BMI below 18.5 is considered underweight.  A BMI of 18.5 to 24.9 is normal.  A BMI of 25 to 29.9 is considered overweight.  A BMI of 30 and above is considered obese.  Watch levels of  cholesterol and blood lipids  You should start having your blood tested for lipids and cholesterol at 75years of age, then have this test every 5 years.  You may need to have your cholesterol levels checked more often if:  Your lipid or cholesterol levels are high.  You are older than 75years of age.  You are at high risk for heart disease.  CANCER SCREENING   Lung Cancer  Lung cancer screening is recommended for adults 533819years old who are at high risk for lung cancer because of a history of smoking.  A yearly low-dose CT scan of the lungs is recommended for people who:  Currently smoke.  Have quit within the past 15 years.  Have at least a 30-pack-year history of smoking. A pack year is smoking an average of one pack of cigarettes a day for 1 year.  Yearly screening should continue until it has been 15 years since you quit.  Yearly screening should stop if you develop a health problem that would prevent you from having lung cancer treatment.  Breast Cancer  Practice breast self-awareness. This means understanding how your breasts normally appear and feel.  It also means doing regular breast self-exams. Let your health care provider know about any changes, no matter how small.  If you are in your 20s or 30s, you should have a clinical breast exam (CBE) by a health care provider every 1-3 years as part of a  regular health exam.  If you are 40 or older, have a CBE every year. Also consider having a breast X-ray (mammogram) every year.  If you have a family history of breast cancer, talk to your health care provider about genetic screening.  If you are at high risk for breast cancer, talk to your health care provider about having an MRI and a mammogram every year.  Breast cancer gene (BRCA) assessment is recommended for women who have family members with BRCA-related cancers. BRCA-related cancers include:  Breast.  Ovarian.  Tubal.  Peritoneal  cancers.  Results of the assessment will determine the need for genetic counseling and BRCA1 and BRCA2 testing. Cervical Cancer Your health care provider may recommend that you be screened regularly for cancer of the pelvic organs (ovaries, uterus, and vagina). This screening involves a pelvic examination, including checking for microscopic changes to the surface of your cervix (Pap test). You may be encouraged to have this screening done every 3 years, beginning at age 41.  For women ages 47-65, health care providers may recommend pelvic exams and Pap testing every 3 years, or they may recommend the Pap and pelvic exam, combined with testing for human papilloma virus (HPV), every 5 years. Some types of HPV increase your risk of cervical cancer. Testing for HPV may also be done on women of any age with unclear Pap test results.  Other health care providers may not recommend any screening for nonpregnant women who are considered low risk for pelvic cancer and who do not have symptoms. Ask your health care provider if a screening pelvic exam is right for you.  If you have had past treatment for cervical cancer or a condition that could lead to cancer, you need Pap tests and screening for cancer for at least 20 years after your treatment. If Pap tests have been discontinued, your risk factors (such as having a new sexual partner) need to be reassessed to determine if screening should resume. Some women have medical problems that increase the chance of getting cervical cancer. In these cases, your health care provider may recommend more frequent screening and Pap tests. Colorectal Cancer  This type of cancer can be detected and often prevented.  Routine colorectal cancer screening usually begins at 75 years of age and continues through 75 years of age.  Your health care provider may recommend screening at an earlier age if you have risk factors for colon cancer.  Your health care provider may also  recommend using home test kits to check for hidden blood in the stool.  A small camera at the end of a tube can be used to examine your colon directly (sigmoidoscopy or colonoscopy). This is done to check for the earliest forms of colorectal cancer.  Routine screening usually begins at age 56.  Direct examination of the colon should be repeated every 5-10 years through 75 years of age. However, you may need to be screened more often if early forms of precancerous polyps or small growths are found. Skin Cancer  Check your skin from head to toe regularly.  Tell your health care provider about any new moles or changes in moles, especially if there is a change in a mole's shape or color.  Also tell your health care provider if you have a mole that is larger than the size of a pencil eraser.  Always use sunscreen. Apply sunscreen liberally and repeatedly throughout the day.  Protect yourself by wearing long sleeves, pants, a wide-brimmed hat,  and sunglasses whenever you are outside. HEART DISEASE, DIABETES, AND HIGH BLOOD PRESSURE   High blood pressure causes heart disease and increases the risk of stroke. High blood pressure is more likely to develop in:  People who have blood pressure in the high end of the normal range (130-139/85-89 mm Hg).  People who are overweight or obese.  People who are African American.  If you are 35-72 years of age, have your blood pressure checked every 3-5 years. If you are 35 years of age or older, have your blood pressure checked every year. You should have your blood pressure measured twice--once when you are at a hospital or clinic, and once when you are not at a hospital or clinic. Record the average of the two measurements. To check your blood pressure when you are not at a hospital or clinic, you can use:  An automated blood pressure machine at a pharmacy.  A home blood pressure monitor.  If you are between 89 years and 55 years old, ask your health  care provider if you should take aspirin to prevent strokes.  Have regular diabetes screenings. This involves taking a blood sample to check your fasting blood sugar level.  If you are at a normal weight and have a low risk for diabetes, have this test once every three years after 75 years of age.  If you are overweight and have a high risk for diabetes, consider being tested at a younger age or more often. PREVENTING INFECTION  Hepatitis B  If you have a higher risk for hepatitis B, you should be screened for this virus. You are considered at high risk for hepatitis B if:  You were born in a country where hepatitis B is common. Ask your health care provider which countries are considered high risk.  Your parents were born in a high-risk country, and you have not been immunized against hepatitis B (hepatitis B vaccine).  You have HIV or AIDS.  You use needles to inject street drugs.  You live with someone who has hepatitis B.  You have had sex with someone who has hepatitis B.  You get hemodialysis treatment.  You take certain medicines for conditions, including cancer, organ transplantation, and autoimmune conditions. Hepatitis C  Blood testing is recommended for:  Everyone born from [birth year] through 1965.  Anyone with known risk factors for hepatitis C. Sexually transmitted infections (STIs)  You should be screened for sexually transmitted infections (STIs) including gonorrhea and chlamydia if:  You are sexually active and are younger than 75 years of age.  You are older than 75 years of age and your health care provider tells you that you are at risk for this type of infection.  Your sexual activity has changed since you were last screened and you are at an increased risk for chlamydia or gonorrhea. Ask your health care provider if you are at risk.  If you do not have HIV, but are at risk, it may be recommended that you take a prescription medicine daily to prevent HIV  infection. This is called pre-exposure prophylaxis (PrEP). You are considered at risk if:  You are sexually active and do not regularly use condoms or know the HIV status of your partner(s).  You take drugs by injection.  You are sexually active with a partner who has HIV. Talk with your health care provider about whether you are at high risk of being infected with HIV. If you choose to begin PrEP, you should  first be tested for HIV. You should then be tested every 3 months for as long as you are taking PrEP.  PREGNANCY   If you are premenopausal and you may become pregnant, ask your health care provider about preconception counseling.  If you may become pregnant, take 400 to 800 micrograms (mcg) of folic acid every day.  If you want to prevent pregnancy, talk to your health care provider about birth control (contraception). OSTEOPOROSIS AND MENOPAUSE   Osteoporosis is a disease in which the bones lose minerals and strength with aging. This can result in serious bone fractures. Your risk for osteoporosis can be identified using a bone density scan.  If you are 69 years of age or older, or if you are at risk for osteoporosis and fractures, ask your health care provider if you should be screened.  Ask your health care provider whether you should take a calcium or vitamin D supplement to lower your risk for osteoporosis.  Menopause may have certain physical symptoms and risks.  Hormone replacement therapy may reduce some of these symptoms and risks. Talk to your health care provider about whether hormone replacement therapy is right for you.  HOME CARE INSTRUCTIONS   Schedule regular health, dental, and eye exams.  Stay current with your immunizations.   Do not use any tobacco products including cigarettes, chewing tobacco, or electronic cigarettes.  If you are pregnant, do not drink alcohol.  If you are breastfeeding, limit how much and how often you drink alcohol.  Limit  alcohol intake to no more than 1 drink per day for nonpregnant women. One drink equals 12 ounces of beer, 5 ounces of wine, or 1 ounces of hard liquor.  Do not use street drugs.  Do not share needles.  Ask your health care provider for help if you need support or information about quitting drugs.  Tell your health care provider if you often feel depressed.  Tell your health care provider if you have ever been abused or do not feel safe at home.   This information is not intended to replace advice given to you by your health care provider. Make sure you discuss any questions you have with your health care provider.   Document Released: 01/12/2011 Document Revised: 07/20/2014 Document Reviewed: 05/31/2013 Elsevier Interactive Patient Education [employer].

## 2015-09-09 NOTE — Assessment & Plan Note (Signed)
Discussed dx and need to monitor for diverticulitis sxs

## 2015-09-09 NOTE — Assessment & Plan Note (Signed)
Chronic, stable. Continue current regimen. Check labs today.  

## 2015-09-09 NOTE — Assessment & Plan Note (Signed)
Discussed. Advanced directive packet provided today.

## 2015-09-09 NOTE — Assessment & Plan Note (Addendum)
Discussed with patient, discussed treatment options. Pt interested in trial bisphosphonate. No planned dental work. Pt will monitor for worsening bone pains or atypical fractures.

## 2015-09-11 ENCOUNTER — Encounter: Payer: Self-pay | Admitting: *Deleted

## 2015-09-11 ENCOUNTER — Other Ambulatory Visit: Payer: Self-pay | Admitting: Family Medicine

## 2015-09-11 DIAGNOSIS — D649 Anemia, unspecified: Secondary | ICD-10-CM

## 2016-03-31 ENCOUNTER — Other Ambulatory Visit: Payer: Self-pay | Admitting: Family Medicine

## 2016-08-13 ENCOUNTER — Telehealth: Payer: Self-pay | Admitting: Family Medicine

## 2016-08-13 NOTE — Telephone Encounter (Signed)
Patient called to notify us she received her flu shot at [facility] in November or December 2017.

## 2016-08-13 NOTE — Telephone Encounter (Signed)
Thank you. Noted in chart.

## 2016-10-07 ENCOUNTER — Encounter: Payer: Self-pay | Admitting: *Deleted

## 2016-10-07 ENCOUNTER — Other Ambulatory Visit: Payer: Self-pay | Admitting: Family Medicine

## 2016-11-02 ENCOUNTER — Ambulatory Visit: Payer: Medicare Other

## 2016-11-02 ENCOUNTER — Ambulatory Visit (INDEPENDENT_AMBULATORY_CARE_PROVIDER_SITE_OTHER)
Admission: RE | Admit: 2016-11-02 | Discharge: 2016-11-02 | Disposition: A | Discharge status: Home / Self Care | Payer: Medicare Other | Source: Ambulatory Visit | Attending: Family Medicine | Admitting: Family Medicine

## 2016-11-02 ENCOUNTER — Encounter: Payer: Self-pay | Admitting: Family Medicine

## 2016-11-02 ENCOUNTER — Ambulatory Visit (INDEPENDENT_AMBULATORY_CARE_PROVIDER_SITE_OTHER): Payer: Medicare Other | Admitting: Family Medicine

## 2016-11-02 ENCOUNTER — Other Ambulatory Visit (INDEPENDENT_AMBULATORY_CARE_PROVIDER_SITE_OTHER): Payer: Medicare Other

## 2016-11-02 VITALS — BP 140/70 | HR 67 | Temp 98.9°F | Ht 61.0 in | Wt 102.5 lb

## 2016-11-02 DIAGNOSIS — E7849 Other hyperlipidemia: Secondary | ICD-10-CM

## 2016-11-02 DIAGNOSIS — E785 Hyperlipidemia, unspecified: Secondary | ICD-10-CM | POA: Insufficient documentation

## 2016-11-02 DIAGNOSIS — M25531 Pain in right wrist: Secondary | ICD-10-CM

## 2016-11-02 DIAGNOSIS — E784 Other hyperlipidemia: Secondary | ICD-10-CM | POA: Diagnosis not present

## 2016-11-02 DIAGNOSIS — D518 Other vitamin B12 deficiency anemias: Secondary | ICD-10-CM

## 2016-11-02 DIAGNOSIS — M25431 Effusion, right wrist: Secondary | ICD-10-CM

## 2016-11-02 LAB — CBC WITH DIFFERENTIAL/PLATELET
BASOS ABS: 0 10*3/uL (ref 0.0–0.1)
BASOS PCT: 0.4 % (ref 0.0–3.0)
Eosinophils Absolute: 0 10*3/uL (ref 0.0–0.7)
Eosinophils Relative: 0.1 % (ref 0.0–5.0)
HEMATOCRIT: 35.1 % — AB (ref 36.0–46.0)
HEMOGLOBIN: 11.4 g/dL — AB (ref 12.0–15.0)
Lymphocytes Relative: 4.9 % — ABNORMAL LOW (ref 12.0–46.0)
Lymphs Abs: 0.5 10*3/uL — ABNORMAL LOW (ref 0.7–4.0)
MCHC: 32.6 g/dL (ref 30.0–36.0)
MCV: 93.3 fl (ref 78.0–100.0)
MONOS PCT: 9.6 % (ref 3.0–12.0)
Monocytes Absolute: 1 10*3/uL (ref 0.1–1.0)
NEUTROS ABS: 9.1 10*3/uL — AB (ref 1.4–7.7)
Neutrophils Relative %: 85 % — ABNORMAL HIGH (ref 43.0–77.0)
PLATELETS: 277 10*3/uL (ref 150.0–400.0)
RBC: 3.76 Mil/uL — ABNORMAL LOW (ref 3.87–5.11)
RDW: 14.4 % (ref 11.5–15.5)
WBC: 10.7 10*3/uL — AB (ref 4.0–10.5)

## 2016-11-02 LAB — LIPID PANEL
CHOLESTEROL: 197 mg/dL (ref 0–200)
HDL: 88.4 mg/dL (ref >39.00)
LDL Cholesterol: 93 mg/dL (ref 0–99)
NONHDL: 108.87
Total CHOL/HDL Ratio: 2
Triglycerides: 79 mg/dL (ref 0.0–149.0)
VLDL: 15.8 mg/dL (ref 0.0–40.0)

## 2016-11-02 LAB — BASIC METABOLIC PANEL
BUN: 14 mg/dL (ref 6–23)
CHLORIDE: 102 meq/L (ref 96–112)
CO2: 25 meq/L (ref 19–32)
CREATININE: 0.92 mg/dL (ref 0.40–1.20)
Calcium: 9.9 mg/dL (ref 8.4–10.5)
GFR: 76.39 mL/min (ref >60.00)
Glucose, Bld: 121 mg/dL — ABNORMAL HIGH (ref 70–99)
POTASSIUM: 3.6 meq/L (ref 3.5–5.1)
SODIUM: 138 meq/L (ref 135–145)

## 2016-11-02 LAB — FOLATE: Folate: 14.9 ng/mL (ref >5.9)

## 2016-11-02 LAB — IBC PANEL
IRON: 7 ug/dL — AB (ref 42–145)
SATURATION RATIOS: 1.9 % — AB (ref 20.0–50.0)
Transferrin: 265 mg/dL (ref 212.0–360.0)

## 2016-11-02 LAB — TSH: TSH: 1.7 u[IU]/mL (ref 0.35–4.50)

## 2016-11-02 LAB — VITAMIN B12: Vitamin B-12: 206 pg/mL — ABNORMAL LOW (ref 211–911)

## 2016-11-02 LAB — URIC ACID: URIC ACID, SERUM: 7 mg/dL (ref 2.4–7.0)

## 2016-11-02 LAB — FERRITIN: FERRITIN: 224.3 ng/mL (ref 10.0–291.0)

## 2016-11-02 MED ORDER — DICLOFENAC SODIUM 75 MG PO TBEC: 75.0000 mg | DELAYED_RELEASE_TABLET | Freq: Two times a day (BID) | ORAL | 0 refills | Status: DC

## 2016-11-02 NOTE — Progress Notes (Signed)
Dr. Frederico Hamman T. Corbitt Cloke, MD, [facility] [place], [address] Phone: [phone] Fax: [phone]  11/02/2016  Patient: Margaret Nguyen, MRN: [id number], DOB: 1940-08-24, 76 y.o.  Primary Physician:  Ria Bush, MD   Chief Complaint  Patient presents with  . Hand Injury    Right-Hurt trying to open Tide on Saturday   Subjective:   Margaret Nguyen is a 76 y.o. very pleasant female patient who presents with the following:  R hand pain:   Opened a bottle of Tide. Right wrist is swollen fairly significantly. She thinks that she had some pain and swelling initially right at the time when she was trying to open a bottle of time detergent aggressively. Since then she has had some swelling in the true wrist and pain with flexion and extension at the wrist as well as ulnar and lateral deviation.  She has not had any fall or trauma that she can recall. She has not any old injuries or old fractures in this region.  Past Medical History, Surgical History, Social History, Family History, Problem List, Medications, and Allergies have been reviewed and updated if relevant.  Patient Active Problem List   Diagnosis Date Noted  . Medicare annual wellness visit, subsequent 09/09/2015  . Advanced care planning/counseling discussion 09/09/2015  . Diverticulosis of colon without hemorrhage 09/09/2015  . Nasal sore 03/08/2015  . Right sided sciatica 01/16/2014  . Routine general medical examination at a health care facility 01/24/2011  . Osteoporosis 08/13/2009  . ALOPECIA 02/28/2008  . Essential hypertension 05/09/2007  . DEGENERATIVE JOINT DISEASE, LEFT HIP 05/09/2007  . Adenomatous colon polyp 05/09/2007    Past Medical History:  Diagnosis Date  . Allergy   . Arthritis   . Diverticulitis   . Family hx colonic polyps   . Heart murmur   . Hiatal hernia   . Hypertension   . Osteoporosis 08/2009   R femur -2.5   . Villous adenoma of colon     Past Surgical History:  Procedure Laterality Date  . COLONOSCOPY  04/2010   polyps, diverticulosis, hem, rec rpt 3 yrs [facility])  . COLONOSCOPY  04/2014   mult TA, one with high grade dysplasia, mod diverticulosis, rpt 5 yrs [facility] Plan)  . DEXA  08/2009   femur -2.5  . DEXA  04/2015   T -3 hips, -1.8 spine  . LAPAROSCOPIC SIGMOID COLECTOMY     villous adenoma  . PARTIAL HYSTERECTOMY     ovaries remain  . TUBAL LIGATION      Social History   Social History  . Marital status: Widowed    Spouse name: N/A  . Number of children: N/A  . Years of education: N/A   Occupational History  . Not on file.   Social History Main Topics  . Smoking status: Never Smoker  . Smokeless tobacco: Never Used  . Alcohol use Yes     Comment: occasional  . Drug use: Unknown  . Sexual activity: Not on file   Other Topics Concern  . Not on file   Social History Narrative   Married [phone], widowed   1 son, 2 daughters, 1 son deceased   5 grandchildren   Lives alone; I- ADLs   Working-fulltime [employer]          Family History  Problem Relation Age of Onset  . Arthritis Mother   . Arthritis Father   . Heart disease  Father   . Hypertension Father   . Colon cancer Neg Hx     Allergies  Allergen Reactions  . Other     Prescription allergy med    Medication list reviewed and updated in full in [place].  GEN: No fevers, chills. Nontoxic. Primarily MSK c/o today. MSK: Detailed in the HPI GI: tolerating PO intake without difficulty Neuro: No numbness, parasthesias, or tingling associated. Otherwise the pertinent positives of the ROS are noted above.   Objective:   BP 140/70   Pulse 67   Temp 98.9 F (37.2 C) (Oral)   Ht 5\' 1"  (1.549 m)   Wt 102 lb 8 oz (46.5 kg)   BMI 19.37 kg/m    GEN: WDWN, NAD, Non-toxic, Alert & Oriented x 3 HEENT: Atraumatic, Normocephalic.  Ears and Nose: No external deformity. EXTR: No  clubbing/cyanosis/edema NEURO: Normal gait.  PSYCH: Normally interactive. Conversant. Not depressed or anxious appearing.  Calm demeanor.    Right hand and wrist: There are some relatively extensive degenerative changes seen on the fingers, mostly in the MCP joints 1 2 and 3. She has fairly extensive swelling in the dorsum of the wrist on the right. This is mildly warm to palpation. Nontender along all the fingers and the metacarpals. Nontender along the entirety of the radius and all her.  Flexion and extensor function of all digits is intact.  Radiology: Dg Wrist Complete Right  Result Date: 11/02/2016 CLINICAL DATA:  Right wrist pain for several days, initial encounter EXAM: RIGHT WRIST - COMPLETE 3+ VIEW COMPARISON:  None. FINDINGS: There is no evidence of fracture or dislocation. There is no evidence of arthropathy or other focal bone abnormality. Soft tissues are unremarkable. IMPRESSION: No acute abnormality noted. Electronically Signed   By: Inez Catalina M.D.   On: 11/02/2016 10:52   Dg Hand Complete Right  Result Date: 11/02/2016 CLINICAL DATA:  Right wrist pain for several days, no known injury, initial encounter EXAM: RIGHT HAND - COMPLETE 3+ VIEW COMPARISON:  None FINDINGS: No acute fracture or dislocation is noted. Very mild interphalangeal degenerative changes are seen. No gross soft tissue abnormality is noted. IMPRESSION: No acute abnormality seen. Electronically Signed   By: Inez Catalina M.D.   On: 11/02/2016 10:46     Assessment and Plan:   Acute wrist pain, right - Plan: DG Wrist Complete Right, DG Hand Complete Right  Wrist effusion, right  Given clinical scenario, most likely internal derangement of ligamentous structures of the wrist with subsequent swelling. Place the patient in a thumb spica splint and recheck in 2 weeks.  Given the extent of swelling and mild warmth, cannot exclude gout versus pseudogout. I'm going to place the patient on oral Voltaren.  I asked  the patient to call me if her symptoms worsen in anyway.  Follow-up: Return in about 2 weeks (around 11/16/2016).  Meds ordered this encounter  Medications  . diclofenac (VOLTAREN) 75 MG EC tablet    Sig: Take 1 tablet (75 mg total) by mouth 2 (two) times daily.    Dispense:  60 tablet    Refill:  0   There are no discontinued medications. Orders Placed This Encounter  Procedures  . DG Wrist Complete Right  . DG Hand Complete Right    Signed,  Dari Carpenito T. Syvilla Martin, MD   Allergies as of 11/02/2016      Reactions   Other    Prescription allergy med      Medication List  Accurate as of 11/02/16  1:49 PM. Always use your most recent med list.          alendronate 70 MG tablet Commonly known as:  FOSAMAX Take 1 tablet (70 mg total) by mouth once a week. Take with a full glass of water on an empty stomach.   CALCIUM 500 +D 500-400 MG-UNIT Tabs Generic drug:  Calcium Carb-Cholecalciferol Take by mouth.   CENTRUM PO Take by mouth.   diclofenac 75 MG EC tablet Commonly known as:  VOLTAREN Take 1 tablet (75 mg total) by mouth 2 (two) times daily.   enalapril 20 MG tablet Commonly known as:  VASOTEC TAKE 1 TABLET BY MOUTH DAILY   fish oil-omega-3 fatty acids 1000 MG capsule Take 1 g by mouth daily.   hydrochlorothiazide 25 MG tablet Commonly known as:  HYDRODIURIL TAKE 1 TABLET BY MOUTH EVERY MORNING   mupirocin cream 2 % Commonly known as:  BACTROBAN Apply 1 application topically 2 (two) times daily.   potassium chloride 10 MEQ tablet Commonly known as:  K-DUR,KLOR-CON TAKE 1 TABLET BY MOUTH DAILY   vitamin C 500 MG tablet Commonly known as:  ASCORBIC ACID Take 500 mg by mouth daily.   Vitamin D3 1000 units Caps Take 1 capsule by mouth daily.

## 2016-11-02 NOTE — Addendum Note (Signed)
Addended by: Ellamae Sia on: 11/02/2016 02:14 PM   Modules accepted: Orders

## 2016-11-02 NOTE — Addendum Note (Signed)
Addended by: Ellamae Sia on: 11/02/2016 02:07 PM   Modules accepted: Orders

## 2016-11-02 NOTE — Progress Notes (Signed)
Pre visit review using our clinic review tool, if applicable. No additional management support is needed unless otherwise documented below in the visit note. 

## 2016-11-09 ENCOUNTER — Ambulatory Visit (INDEPENDENT_AMBULATORY_CARE_PROVIDER_SITE_OTHER): Payer: Medicare Other | Admitting: Family Medicine

## 2016-11-09 ENCOUNTER — Encounter: Payer: Self-pay | Admitting: Family Medicine

## 2016-11-09 VITALS — BP 124/62 | HR 72 | Temp 97.5°F | Ht 60.75 in | Wt 106.2 lb

## 2016-11-09 DIAGNOSIS — I10 Essential (primary) hypertension: Secondary | ICD-10-CM

## 2016-11-09 DIAGNOSIS — M810 Age-related osteoporosis without current pathological fracture: Secondary | ICD-10-CM | POA: Diagnosis not present

## 2016-11-09 DIAGNOSIS — Z1239 Encounter for other screening for malignant neoplasm of breast: Secondary | ICD-10-CM

## 2016-11-09 DIAGNOSIS — D509 Iron deficiency anemia, unspecified: Secondary | ICD-10-CM

## 2016-11-09 DIAGNOSIS — Z7189 Other specified counseling: Secondary | ICD-10-CM

## 2016-11-09 DIAGNOSIS — Z0000 Encounter for general adult medical examination without abnormal findings: Secondary | ICD-10-CM | POA: Diagnosis not present

## 2016-11-09 DIAGNOSIS — E538 Deficiency of other specified B group vitamins: Secondary | ICD-10-CM | POA: Diagnosis not present

## 2016-11-09 DIAGNOSIS — Z1231 Encounter for screening mammogram for malignant neoplasm of breast: Secondary | ICD-10-CM | POA: Diagnosis not present

## 2016-11-09 DIAGNOSIS — E785 Hyperlipidemia, unspecified: Secondary | ICD-10-CM

## 2016-11-09 DIAGNOSIS — D631 Anemia in chronic kidney disease: Secondary | ICD-10-CM | POA: Insufficient documentation

## 2016-11-09 DIAGNOSIS — D649 Anemia, unspecified: Secondary | ICD-10-CM | POA: Insufficient documentation

## 2016-11-09 DIAGNOSIS — D126 Benign neoplasm of colon, unspecified: Secondary | ICD-10-CM | POA: Diagnosis not present

## 2016-11-09 MED ORDER — VITAMIN B-12 1000 MCG PO TABS: 1000.0000 ug | ORAL_TABLET | Freq: Every day | ORAL | Status: DC

## 2016-11-09 MED ORDER — FERROUS SULFATE 325 (65 FE) MG PO TABS: 325.0000 mg | ORAL_TABLET | Freq: Every day | ORAL | 3 refills | Status: DC

## 2016-11-09 NOTE — Progress Notes (Signed)
Pre visit review using our clinic review tool, if applicable. No additional management support is needed unless otherwise documented below in the visit note. 

## 2016-11-09 NOTE — Assessment & Plan Note (Signed)
Chronic, stable. Continue enalapril 20mg  daily and hctz 25mg  daily.

## 2016-11-09 NOTE — Assessment & Plan Note (Signed)
Chronic, stable. Reviewed FLP with patient. Great control. Continue fish oil.

## 2016-11-09 NOTE — Assessment & Plan Note (Addendum)
New. Anticipate true IDA, and ferritin level falsely elevated after recent acute wrist injury. rec start ferrous sulfate 325mg  daily OTC.  RTC 6 mo recheck labs and OV afterwards.

## 2016-11-09 NOTE — Assessment & Plan Note (Signed)
Reviewed low B12 level with patient - rec start B12 OTC 107mcg daily.

## 2016-11-09 NOTE — Assessment & Plan Note (Signed)

## 2016-11-09 NOTE — Assessment & Plan Note (Addendum)
h/o high grade dysplastic polyp 05/2014. Will be due for rpt colonoscopy later this year.

## 2016-11-09 NOTE — Assessment & Plan Note (Addendum)
Advanced directive discussion - Discussed. Advanced directive packet provided last visit. She has talked with family. Daughter RN would be HCPOA. Doesn't want prolonged life support if terminal condition.

## 2016-11-09 NOTE — Progress Notes (Signed)
BP 124/62   Pulse 72   Temp 97.5 F (36.4 C) (Oral)   Ht 5' 0.75" (1.543 m)   Wt 106 lb 4 oz (48.2 kg)   SpO2 96%   BMI 20.24 kg/m    CC: medicare wellness visit Subjective:    Patient ID: Margaret Nguyen, female    DOB: [birth year], 76 y.o.   MRN: [id number]  HPI: Margaret Nguyen is a 76 y.o. female presenting on 11/09/2016 for Annual Exam (medicare)   Last week AMW visit was changed to acute visit for R wrist pain/swelling s/p eval by Dr Lorelei Pont. Thought ligament injury while opening jar leading to pain/swelling. xrays did not show fracture. Treated with thumb spica brace, oral voltaren, has f/u planned next week. ?gout vs pseudogout Urate 7.0. Slowly improving.   Osteoporosis - DEXA T -2.5 (2011) to -3.0 (2016). Fosamax started 08/2015 - she did not take this. Rpt today.   Hearing screen - passed Vision screen - failed R eye. Due to see eye doctor. Fall risk screen - passed Depression screen - passed  Preventative: COLONOSCOPY Date: 04/2014 mult TA, one with high grade dysplasia, mod diverticulosis, rpt 3 yrs [facility] Plan) Lung cancer screening - not candidate  Breast cancer screening - WNL 04/2015. She doesn't do breast exams at home.  Well woman exam - s/p partial hysterectomy. Ovaries remain.  DEXA Date: 04/2015 T -3 hips, -1.8 . Discussed bisphosphonate - pt willing to try. Flu shot yearly Pneumovax 02/2006, prevnar 08/2015 Tdap 01/2011 Shingles shot - declines due to financial reasons Advanced directive discussion - Discussed. Advanced directive packet provided today. She has talked with family. Daughter RN would be HCPOA. Doesn't want prolonged life support if terminal condition.  Seat belt use discussed Sunscreen use discussed, no changing moles on skin. Non smoker Alcohol - 1-2 shots brandy regularly  Married [phone], widowed 1 son, 2 daughters, 1 son deceased 5 grandchildren Lives alone; I- ADLs Retired, used to work [employer] Activity: some  walking  Relevant past medical, surgical, family and social history reviewed and updated as indicated. Interim medical history since our last visit reviewed. Allergies and medications reviewed and updated. Outpatient Medications Prior to Visit  Medication Sig Dispense Refill  . Calcium Carb-Cholecalciferol (CALCIUM 500 +D) 500-400 MG-UNIT TABS Take by mouth.      . Cholecalciferol (VITAMIN D3) 1000 UNITS CAPS Take 1 capsule by mouth daily.    . diclofenac (VOLTAREN) 75 MG EC tablet Take 1 tablet (75 mg total) by mouth 2 (two) times daily. 60 tablet 0  . enalapril (VASOTEC) 20 MG tablet TAKE 1 TABLET BY MOUTH DAILY 30 tablet 0  . fish oil-omega-3 fatty acids 1000 MG capsule Take 1 g by mouth daily.     . hydrochlorothiazide (HYDRODIURIL) 25 MG tablet TAKE 1 TABLET BY MOUTH EVERY MORNING 30 tablet 0  . Multiple Vitamins-Minerals (CENTRUM PO) Take by mouth.      . mupirocin cream (BACTROBAN) 2 % Apply 1 application topically 2 (two) times daily. 15 g 0  . potassium chloride (K-DUR,KLOR-CON) 10 MEQ tablet TAKE 1 TABLET BY MOUTH DAILY 30 tablet 0  . vitamin C (ASCORBIC ACID) 500 MG tablet Take 500 mg by mouth daily.    Marland Kitchen alendronate (FOSAMAX) 70 MG tablet Take 1 tablet (70 mg total) by mouth once a week. Take with a full glass of water on an empty stomach. 4 tablet 11   No facility-administered medications prior to visit.  Per HPI unless specifically indicated in ROS section below Review of Systems     Objective:    BP 124/62   Pulse 72   Temp 97.5 F (36.4 C) (Oral)   Ht 5' 0.75" (1.543 m)   Wt 106 lb 4 oz (48.2 kg)   SpO2 96%   BMI 20.24 kg/m   Wt Readings from Last 3 Encounters:  11/09/16 106 lb 4 oz (48.2 kg)  11/02/16 102 lb 8 oz (46.5 kg)  09/09/15 103 lb 12 oz (47.1 kg)    Physical Exam  Constitutional: She is oriented to person, place, and time. She appears well-developed and well-nourished. No distress.  HENT:  Head: Normocephalic and atraumatic.  Right Ear:  Hearing, tympanic membrane, external ear and ear canal normal.  Left Ear: Hearing, tympanic membrane, external ear and ear canal normal.  Nose: Nose normal.  Mouth/Throat: Uvula is midline, oropharynx is clear and moist and mucous membranes are normal. No oropharyngeal exudate, posterior oropharyngeal edema or posterior oropharyngeal erythema.  Eyes: Conjunctivae and EOM are normal. Pupils are equal, round, and reactive to light. No scleral icterus.  Neck: Normal range of motion. Neck supple. Carotid bruit is not present. No thyromegaly present.  Cardiovascular: Normal rate, regular rhythm, normal heart sounds and intact distal pulses.   Occasional extrasystoles are present.  No murmur heard. Pulses:      Radial pulses are 2+ on the right side, and 2+ on the left side.  Pulmonary/Chest: Effort normal and breath sounds normal. No respiratory distress. She has no wheezes. She has no rales.  Abdominal: Soft. Bowel sounds are normal. She exhibits no distension and no mass. There is no tenderness. There is no rebound and no guarding.  Musculoskeletal: Normal range of motion. She exhibits no edema.  Lymphadenopathy:    She has no cervical adenopathy.  Neurological: She is alert and oriented to person, place, and time.  CN grossly intact, station and gait intact Recall 1/3, 3/3 with cue Calculation D-L-R-O-W 5/5  Skin: Skin is warm and dry. No rash noted.  Multiple nevi and SKs throughout skin  Psychiatric: She has a normal mood and affect. Her behavior is normal. Judgment and thought content normal.  Nursing note and vitals reviewed.  Results for orders placed or performed in visit on 11/02/16  CBC with Differential/Platelet  Result Value Ref Range   WBC 10.7 (H) 4.0 - 10.5 K/uL   RBC 3.76 (L) 3.87 - 5.11 Mil/uL   Hemoglobin 11.4 (L) 12.0 - 15.0 g/dL   HCT 35.1 (L) 36.0 - 46.0 %   MCV 93.3 78.0 - 100.0 fl   MCHC 32.6 30.0 - 36.0 g/dL   RDW 14.4 11.5 - 15.5 %   Platelets 277.0 150.0 -  400.0 K/uL   Neutrophils Relative % 85.0 (H) 43.0 - 77.0 %   Lymphocytes Relative 4.9 (L) 12.0 - 46.0 %   Monocytes Relative 9.6 3.0 - 12.0 %   Eosinophils Relative 0.1 0.0 - 5.0 %   Basophils Relative 0.4 0.0 - 3.0 %   Neutro Abs 9.1 (H) 1.4 - 7.7 K/uL   Lymphs Abs 0.5 (L) 0.7 - 4.0 K/uL   Monocytes Absolute 1.0 0.1 - 1.0 K/uL   Eosinophils Absolute 0.0 0.0 - 0.7 K/uL   Basophils Absolute 0.0 0.0 - 0.1 K/uL  Vitamin B12  Result Value Ref Range   Vitamin B-12 206 (L) 211 - 911 pg/mL  Folate  Result Value Ref Range   Folate 14.9 >5.9 ng/mL  IBC panel  Result Value Ref Range   Iron 7 (L) 42 - 145 ug/dL   Transferrin 265.0 212.0 - 360.0 mg/dL   Saturation Ratios 1.9 (L) 20.0 - 50.0 %  Ferritin  Result Value Ref Range   Ferritin 224.3 10.0 - 291.0 ng/mL  Lipid panel  Result Value Ref Range   Cholesterol 197 0 - 200 mg/dL   Triglycerides 79.0 0.0 - 149.0 mg/dL   HDL 88.40 >39.00 mg/dL   VLDL 15.8 0.0 - 40.0 mg/dL   LDL Cholesterol 93 0 - 99 mg/dL   Total CHOL/HDL Ratio 2    NonHDL 108.87   TSH  Result Value Ref Range   TSH 1.70 0.35 - 4.50 uIU/mL  Basic metabolic panel  Result Value Ref Range   Sodium 138 135 - 145 mEq/L   Potassium 3.6 3.5 - 5.1 mEq/L   Chloride 102 96 - 112 mEq/L   CO2 25 19 - 32 mEq/L   Glucose, Bld 121 (H) 70 - 99 mg/dL   BUN 14 6 - 23 mg/dL   Creatinine, Ser 0.92 0.40 - 1.20 mg/dL   Calcium 9.9 8.4 - 10.5 mg/dL   GFR 76.39 >60.00 mL/min  Uric acid  Result Value Ref Range   Uric Acid, Serum 7.0 2.4 - 7.0 mg/dL      Assessment & Plan:   Problem List Items Addressed This Visit    Adenomatous colon polyp    h/o high grade dysplastic polyp 05/2014. Will be due for rpt colonoscopy later this year.       Advanced care planning/counseling discussion    Advanced directive discussion - Discussed. Advanced directive packet provided last visit. She has talked with family. Daughter RN would be HCPOA. Doesn't want prolonged life support if terminal  condition.       Dyslipidemia    Chronic, stable. Reviewed FLP with patient. Great control. Continue fish oil.       Essential hypertension    Chronic, stable. Continue enalapril 20mg  daily and hctz 25mg  daily.       Iron deficiency anemia    New. Anticipate true IDA, and ferritin level falsely elevated after recent acute wrist injury. rec start ferrous sulfate 325mg  daily OTC.  RTC 6 mo recheck labs and OV afterwards.       Relevant Medications   vitamin B-12 (CYANOCOBALAMIN) 1000 MCG tablet   ferrous sulfate 325 (65 FE) MG tablet   Medicare annual wellness visit, subsequent - Primary    I have personally reviewed the Medicare Annual Wellness questionnaire and have noted 1. The patient's medical and social history 2. Their use of alcohol, tobacco or illicit drugs 3. Their current medications and supplements 4. The patient's functional ability including ADL's, fall risks, home safety risks and hearing or visual impairment. Cognitive function has been assessed and addressed as indicated.  5. Diet and physical activity 6. Evidence for depression or mood disorders The patients weight, height, BMI have been recorded in the chart. I have made referrals, counseling and provided education to the patient based on review of the above and I have provided the pt with a written personalized care plan for preventive services. Provider list updated.. See scanned questionairre as needed for further documentation. Reviewed preventative protocols and updated unless pt declined.       Osteoporosis    Pt has not been taking fosamax regularly "I didn't want to take an extra pill" - interested in prolia. rec stop fosamax. I will ask Earl Lagos to check  into this for patient. She may start when next available.       Relevant Medications   denosumab (PROLIA) 60 MG/ML SOLN injection   Other Relevant Orders   DG BONE DENSITY (DXA)   Vitamin B12 deficiency    Reviewed low B12 level with patient - rec  start B12 OTC 1052mcg daily.        Other Visit Diagnoses    Breast cancer screening       Relevant Orders   MM Digital Screening       Follow up plan: Return in about 6 months (around 05/11/2017) for follow up visit.  Ria Bush, MD

## 2016-11-09 NOTE — Assessment & Plan Note (Addendum)
Pt has not been taking fosamax regularly "I didn't want to take an extra pill" - interested in prolia. rec stop fosamax. I will ask Earl Lagos to check into this for patient. She may start when next available.

## 2016-11-09 NOTE — Patient Instructions (Addendum)
We will sign you up for prolia. We will update bone density scan to follow osteoporosis. We will schedule you for mammogram on same day as bone density scan.  Schedule eye doctor appointment for recheck [facility]).  Iron and B12 levels were low, sugar was high. Start oral vitamin b12 1061mg daily and iron (ferrous sulfate) 3247m(65FE) daily over the counter. Watch added sugars in the diet, avoid sweetened beverages.   Health Maintenance, Female Adopting a healthy lifestyle and getting preventive care can go a long way to promote health and wellness. Talk with your health care provider about what schedule of regular examinations is right for you. This is a good chance for you to check in with your provider about disease prevention and staying healthy. In between checkups, there are plenty of things you can do on your own. Experts have done a lot of research about which lifestyle changes and preventive measures are most likely to keep you healthy. Ask your health care provider for more information. Weight and diet Eat a healthy diet  Be sure to include plenty of vegetables, fruits, low-fat dairy products, and lean protein.  Do not eat a lot of foods high in solid fats, added sugars, or salt.  Get regular exercise. This is one of the most important things you can do for your health.  Most adults should exercise for at least 150 minutes each week. The exercise should increase your heart rate and make you sweat (moderate-intensity exercise).  Most adults should also do strengthening exercises at least twice a week. This is in addition to the moderate-intensity exercise. Maintain a healthy weight  Body mass index (BMI) is a measurement that can be used to identify possible weight problems. It estimates body fat based on height and weight. Your health care provider can help determine your BMI and help you achieve or maintain a healthy weight.  For females 2030ears of age and older:  A BMI below  18.5 is considered underweight.  A BMI of 18.5 to 24.9 is normal.  A BMI of 25 to 29.9 is considered overweight.  A BMI of 30 and above is considered obese. Watch levels of cholesterol and blood lipids  You should start having your blood tested for lipids and cholesterol at 2054ears of age, then have this test every 5 years.  You may need to have your cholesterol levels checked more often if:  Your lipid or cholesterol levels are high.  You are older than 5029ears of age.  You are at high risk for heart disease. Cancer screening Lung Cancer  Lung cancer screening is recommended for adults [id number] old who are at high risk for lung cancer because of a history of smoking.  A yearly low-dose CT scan of the lungs is recommended for people who:  Currently smoke.  Have quit within the past 15 years.  Have at least a 30-pack-year history of smoking. A pack year is smoking an average of one pack of cigarettes a day for 1 year.  Yearly screening should continue until it has been 15 years since you quit.  Yearly screening should stop if you develop a health problem that would prevent you from having lung cancer treatment. Breast Cancer  Practice breast self-awareness. This means understanding how your breasts normally appear and feel.  It also means doing regular breast self-exams. Let your health care provider know about any changes, no matter how small.  If you are in your 20s or 30s,  you should have a clinical breast exam (CBE) by a health care provider every 1-3 years as part of a regular health exam.  If you are 40 or older, have a CBE every year. Also consider having a breast X-ray (mammogram) every year.  If you have a family history of breast cancer, talk to your health care provider about genetic screening.  If you are at high risk for breast cancer, talk to your health care provider about having an MRI and a mammogram every year.  Breast cancer gene (BRCA)  assessment is recommended for women who have family members with BRCA-related cancers. BRCA-related cancers include:  Breast.  Ovarian.  Tubal.  Peritoneal cancers.  Results of the assessment will determine the need for genetic counseling and BRCA1 and BRCA2 testing. Cervical Cancer  Your health care provider may recommend that you be screened regularly for cancer of the pelvic organs (ovaries, uterus, and vagina). This screening involves a pelvic examination, including checking for microscopic changes to the surface of your cervix (Pap test). You may be encouraged to have this screening done every 3 years, beginning at age 21.  For women ages 30-65, health care providers may recommend pelvic exams and Pap testing every 3 years, or they may recommend the Pap and pelvic exam, combined with testing for human papilloma virus (HPV), every 5 years. Some types of HPV increase your risk of cervical cancer. Testing for HPV may also be done on women of any age with unclear Pap test results.  Other health care providers may not recommend any screening for nonpregnant women who are considered low risk for pelvic cancer and who do not have symptoms. Ask your health care provider if a screening pelvic exam is right for you.  If you have had past treatment for cervical cancer or a condition that could lead to cancer, you need Pap tests and screening for cancer for at least 20 years after your treatment. If Pap tests have been discontinued, your risk factors (such as having a new sexual partner) need to be reassessed to determine if screening should resume. Some women have medical problems that increase the chance of getting cervical cancer. In these cases, your health care provider may recommend more frequent screening and Pap tests. Colorectal Cancer  This type of cancer can be detected and often prevented.  Routine colorectal cancer screening usually begins at 76 years of age and continues through 75  years of age.  Your health care provider may recommend screening at an earlier age if you have risk factors for colon cancer.  Your health care provider may also recommend using home test kits to check for hidden blood in the stool.  A small camera at the end of a tube can be used to examine your colon directly (sigmoidoscopy or colonoscopy). This is done to check for the earliest forms of colorectal cancer.  Routine screening usually begins at age 50.  Direct examination of the colon should be repeated every 5-10 years through 75 years of age. However, you may need to be screened more often if early forms of precancerous polyps or small growths are found. Skin Cancer  Check your skin from head to toe regularly.  Tell your health care provider about any new moles or changes in moles, especially if there is a change in a mole's shape or color.  Also tell your health care provider if you have a mole that is larger than the size of a pencil eraser.  Always   use sunscreen. Apply sunscreen liberally and repeatedly throughout the day.  Protect yourself by wearing long sleeves, pants, a wide-brimmed hat, and sunglasses whenever you are outside. Heart disease, diabetes, and high blood pressure  High blood pressure causes heart disease and increases the risk of stroke. High blood pressure is more likely to develop in:  People who have blood pressure in the high end of the normal range (130-139/85-89 mm Hg).  People who are overweight or obese.  People who are African American.  If you are 18-39 years of age, have your blood pressure checked every 3-5 years. If you are 40 years of age or older, have your blood pressure checked every year. You should have your blood pressure measured twice-once when you are at a hospital or clinic, and once when you are not at a hospital or clinic. Record the average of the two measurements. To check your blood pressure when you are not at a hospital or clinic,  you can use:  An automated blood pressure machine at a pharmacy.  A home blood pressure monitor.  If you are between 55 years and 79 years old, ask your health care provider if you should take aspirin to prevent strokes.  Have regular diabetes screenings. This involves taking a blood sample to check your fasting blood sugar level.  If you are at a normal weight and have a low risk for diabetes, have this test once every three years after 76 years of age.  If you are overweight and have a high risk for diabetes, consider being tested at a younger age or more often. Preventing infection Hepatitis B  If you have a higher risk for hepatitis B, you should be screened for this virus. You are considered at high risk for hepatitis B if:  You were born in a country where hepatitis B is common. Ask your health care provider which countries are considered high risk.  Your parents were born in a high-risk country, and you have not been immunized against hepatitis B (hepatitis B vaccine).  You have HIV or AIDS.  You use needles to inject street drugs.  You live with someone who has hepatitis B.  You have had sex with someone who has hepatitis B.  You get hemodialysis treatment.  You take certain medicines for conditions, including cancer, organ transplantation, and autoimmune conditions. Hepatitis C  Blood testing is recommended for:  Everyone born from 1945 through 1965.  Anyone with known risk factors for hepatitis C. Sexually transmitted infections (STIs)  You should be screened for sexually transmitted infections (STIs) including gonorrhea and chlamydia if:  You are sexually active and are younger than 76 years of age.  You are older than 76 years of age and your health care provider tells you that you are at risk for this type of infection.  Your sexual activity has changed since you were last screened and you are at an increased risk for chlamydia or gonorrhea. Ask your  health care provider if you are at risk.  If you do not have HIV, but are at risk, it may be recommended that you take a prescription medicine daily to prevent HIV infection. This is called pre-exposure prophylaxis (PrEP). You are considered at risk if:  You are sexually active and do not regularly use condoms or know the HIV status of your partner(s).  You take drugs by injection.  You are sexually active with a partner who has HIV. Talk with your health care provider about   whether you are at high risk of being infected with HIV. If you choose to begin PrEP, you should first be tested for HIV. You should then be tested every 3 months for as long as you are taking PrEP. Pregnancy  If you are premenopausal and you may become pregnant, ask your health care provider about preconception counseling.  If you may become pregnant, take 400 to 800 micrograms (mcg) of folic acid every day.  If you want to prevent pregnancy, talk to your health care provider about birth control (contraception). Osteoporosis and menopause  Osteoporosis is a disease in which the bones lose minerals and strength with aging. This can result in serious bone fractures. Your risk for osteoporosis can be identified using a bone density scan.  If you are 58 years of age or older, or if you are at risk for osteoporosis and fractures, ask your health care provider if you should be screened.  Ask your health care provider whether you should take a calcium or vitamin D supplement to lower your risk for osteoporosis.  Menopause may have certain physical symptoms and risks.  Hormone replacement therapy may reduce some of these symptoms and risks. Talk to your health care provider about whether hormone replacement therapy is right for you. Follow these instructions at home:  Schedule regular health, dental, and eye exams.  Stay current with your immunizations.  Do not use any tobacco products including cigarettes, chewing  tobacco, or electronic cigarettes.  If you are pregnant, do not drink alcohol.  If you are breastfeeding, limit how much and how often you drink alcohol.  Limit alcohol intake to no more than 1 drink per day for nonpregnant women. One drink equals 12 ounces of beer, 5 ounces of wine, or 1 ounces of hard liquor.  Do not use street drugs.  Do not share needles.  Ask your health care provider for help if you need support or information about quitting drugs.  Tell your health care provider if you often feel depressed.  Tell your health care provider if you have ever been abused or do not feel safe at home. This information is not intended to replace advice given to you by your health care provider. Make sure you discuss any questions you have with your health care provider. Document Released: 01/12/2011 Document Revised: 12/05/2015 Document Reviewed: 04/02/2015 Elsevier Interactive Patient Education  2017 [employer].

## 2016-11-12 ENCOUNTER — Telehealth: Payer: Self-pay | Admitting: *Deleted

## 2016-11-12 NOTE — Telephone Encounter (Signed)
Information has been submitted to pts insurance for verification of benefits. Awaiting response for coverage  

## 2016-11-13 ENCOUNTER — Encounter: Payer: Self-pay | Admitting: Family Medicine

## 2016-11-13 LAB — HM MAMMOGRAPHY

## 2016-11-16 ENCOUNTER — Encounter: Payer: Self-pay | Admitting: *Deleted

## 2016-11-16 ENCOUNTER — Ambulatory Visit: Payer: Medicare Other | Admitting: Family Medicine

## 2016-11-16 ENCOUNTER — Other Ambulatory Visit: Payer: Self-pay | Admitting: Family Medicine

## 2016-11-17 ENCOUNTER — Other Ambulatory Visit: Payer: Self-pay | Admitting: Family Medicine

## 2016-11-19 ENCOUNTER — Ambulatory Visit: Payer: Medicare Other | Admitting: Family Medicine

## 2016-11-20 ENCOUNTER — Encounter: Payer: Self-pay | Admitting: Family Medicine

## 2016-11-23 ENCOUNTER — Ambulatory Visit: Payer: Medicare Other | Admitting: Family Medicine

## 2016-11-23 MED ORDER — POTASSIUM CHLORIDE CRYS ER 10 MEQ PO TBCR: 10.0000 meq | EXTENDED_RELEASE_TABLET | Freq: Every day | ORAL | 1 refills | Status: DC

## 2016-11-23 MED ORDER — ENALAPRIL MALEATE 20 MG PO TABS: 20.0000 mg | ORAL_TABLET | Freq: Every day | ORAL | 1 refills | Status: DC

## 2016-11-23 NOTE — Addendum Note (Signed)
Addended by: Tammi Sou on: 11/23/2016 04:32 PM   Modules accepted: Orders

## 2016-11-23 NOTE — Telephone Encounter (Signed)
Pt request Rxs to be changed to 90 day supply due to cost, done

## 2016-12-03 ENCOUNTER — Ambulatory Visit (INDEPENDENT_AMBULATORY_CARE_PROVIDER_SITE_OTHER): Payer: Medicare Other | Admitting: Family Medicine

## 2016-12-03 ENCOUNTER — Encounter: Payer: Self-pay | Admitting: Family Medicine

## 2016-12-03 VITALS — BP 126/74 | HR 74 | Temp 98.5°F | Wt 103.5 lb

## 2016-12-03 DIAGNOSIS — M25431 Effusion, right wrist: Secondary | ICD-10-CM

## 2016-12-03 DIAGNOSIS — M25531 Pain in right wrist: Secondary | ICD-10-CM

## 2016-12-03 MED ORDER — TRIAMCINOLONE ACETONIDE 0.1 % EX CREA: 1.0000 "application " | TOPICAL_CREAM | Freq: Two times a day (BID) | CUTANEOUS | 1 refills | Status: DC

## 2016-12-03 NOTE — Patient Instructions (Signed)
Can get a stress ball (or racquetball or tennis ball)  Use a bunched up towel to squeeze.

## 2016-12-03 NOTE — Progress Notes (Signed)
Dr. Frederico Hamman T. Ranon Coven, MD, [facility] [place], [address] Phone: [phone] Fax: [phone]  12/03/2016  Patient: Margaret Nguyen, MRN: [id number], DOB: 1940-12-30, 76 y.o.  Primary Physician:  Ria Bush, MD   Chief Complaint  Patient presents with  . Follow-up   Subjective:   Margaret Nguyen is a 76 y.o. very pleasant female patient who presents with the following:  F/u wrist, R:  She is now approximately 1 month status post injury of her right wrist. At that point when I saw her initially, the patient did not show signs of x-ray fracture. She did have a significant effusion and had limitation on her motion quite a bit. Intervally, she is improved and is essentially normal, with the exception of her grip is weak and somewhat over time.  Past Medical History, Surgical History, Social History, Family History, Problem List, Medications, and Allergies have been reviewed and updated if relevant.  Patient Active Problem List   Diagnosis Date Noted  . Iron deficiency anemia 11/09/2016  . Vitamin B12 deficiency 11/09/2016  . Dyslipidemia 11/02/2016  . Medicare annual wellness visit, subsequent 09/09/2015  . Advanced care planning/counseling discussion 09/09/2015  . Diverticulosis of colon without hemorrhage 09/09/2015  . Right sided sciatica 01/16/2014  . Routine general medical examination at a health care facility 01/24/2011  . Osteoporosis 08/13/2009  . ALOPECIA 02/28/2008  . Essential hypertension 05/09/2007  . DEGENERATIVE JOINT DISEASE, LEFT HIP 05/09/2007  . Adenomatous colon polyp 05/09/2007    Past Medical History:  Diagnosis Date  . Allergy   . Arthritis   . Diverticulitis   . Family hx colonic polyps   . Heart murmur   . Hiatal hernia   . Hypertension   . Osteoporosis 08/2009   R femur -2.5  . Villous adenoma of colon     Past Surgical History:  Procedure Laterality Date   . COLONOSCOPY  04/2010   polyps, diverticulosis, hem, rec rpt 3 yrs [facility])  . COLONOSCOPY  04/2014   mult TA, one with high grade dysplasia, mod diverticulosis, rpt 5 yrs [facility] Plan)  . DEXA  08/2009   femur -2.5  . DEXA  04/2015   T -3 hips, -1.8 spine  . LAPAROSCOPIC SIGMOID COLECTOMY     villous adenoma  . PARTIAL HYSTERECTOMY     ovaries remain  . TUBAL LIGATION      Social History   Social History  . Marital status: Widowed    Spouse name: N/A  . Number of children: N/A  . Years of education: N/A   Occupational History  . Not on file.   Social History Main Topics  . Smoking status: Never Smoker  . Smokeless tobacco: Never Used  . Alcohol use Yes     Comment: occasional  . Drug use: Unknown  . Sexual activity: Not on file   Other Topics Concern  . Not on file   Social History Narrative   Married [phone], widowed   1 son, 2 daughters, 1 son deceased   5 grandchildren   Lives alone; I- ADLs   Working-fulltime [employer]          Family History  Problem Relation Age of Onset  . Arthritis Mother   . Arthritis Father   . Heart disease Father   . Hypertension Father   . Colon cancer Neg Hx     Allergies  Allergen Reactions  . Other  Prescription allergy med    Medication list reviewed and updated in full in [place].  GEN: No fevers, chills. Nontoxic. Primarily MSK c/o today. MSK: Detailed in the HPI GI: tolerating PO intake without difficulty Neuro: No numbness, parasthesias, or tingling associated. Otherwise the pertinent positives of the ROS are noted above.   Objective:   BP 126/74   Pulse 74   Temp 98.5 F (36.9 C) (Oral)   Wt 103 lb 8 oz (46.9 kg)   SpO2 99%   BMI 19.72 kg/m    GEN: WDWN, NAD, Non-toxic, Alert & Oriented x 3 HEENT: Atraumatic, Normocephalic.  Ears and Nose: No external deformity. EXTR: No clubbing/cyanosis/edema NEURO: Normal gait.  PSYCH: Normally interactive. Conversant. Not depressed or  anxious appearing.  Calm demeanor.    Right fingers entirely nontender. Medical carpals are nontender she has some mild effusion in the true wrist joint. However range of motion in flexion, extension, as well as ulnar Route lateral deviation is much improved. Finklestein's is negative. Nontender at the anatomical snuff box.  Radiology: Dg Wrist Complete Right  Result Date: 11/02/2016 CLINICAL DATA:  Right wrist pain for several days, initial encounter EXAM: RIGHT WRIST - COMPLETE 3+ VIEW COMPARISON:  None. FINDINGS: There is no evidence of fracture or dislocation. There is no evidence of arthropathy or other focal bone abnormality. Soft tissues are unremarkable. IMPRESSION: No acute abnormality noted. Electronically Signed   By: Inez Catalina M.D.   On: 11/02/2016 10:52   Dg Hand Complete Right  Result Date: 11/02/2016 CLINICAL DATA:  Right wrist pain for several days, no known injury, initial encounter EXAM: RIGHT HAND - COMPLETE 3+ VIEW COMPARISON:  None FINDINGS: No acute fracture or dislocation is noted. Very mild interphalangeal degenerative changes are seen. No gross soft tissue abnormality is noted. IMPRESSION: No acute abnormality seen. Electronically Signed   By: Inez Catalina M.D.   On: 11/02/2016 10:46    Assessment and Plan:   Acute wrist pain, right  Wrist effusion, right  She has done quite well. Her strength is managed somewhat over time, likely secondary to disuse. Encouraged her to work on her strength training and showed her multiple techniques.  Follow-up: No Follow-up on file.  Meds ordered this encounter  Medications  . triamcinolone cream (KENALOG) 0.1 %    Sig: Apply 1 application topically 2 (two) times daily.    Dispense:  60 g    Refill:  1   Patient Instructions  Can get a stress ball (or racquetball or tennis ball)  Use a bunched up towel to squeeze.     Signed,  Maud Deed. Jaequan Propes, MD   Allergies as of 12/03/2016      Reactions   Other     Prescription allergy med      Medication List       Accurate as of 12/03/16 10:07 AM. Always use your most recent med list.          CALCIUM 500 +D 500-400 MG-UNIT Tabs Generic drug:  Calcium Carb-Cholecalciferol Take by mouth.   CENTRUM PO Take by mouth.   denosumab 60 MG/ML Soln injection Commonly known as:  PROLIA Inject 60 mg into the skin every 3 (three) months. Administer in upper arm, thigh, or abdomen   diclofenac 75 MG EC tablet Commonly known as:  VOLTAREN Take 1 tablet (75 mg total) by mouth 2 (two) times daily.   enalapril 20 MG tablet Commonly known as:  VASOTEC Take 1 tablet (20  mg total) by mouth daily.   ferrous sulfate 325 (65 FE) MG tablet Take 1 tablet (325 mg total) by mouth daily with breakfast.   fish oil-omega-3 fatty acids 1000 MG capsule Take 1 g by mouth daily.   hydrochlorothiazide 25 MG tablet Commonly known as:  HYDRODIURIL TAKE 1 TABLET BY MOUTH EVERY MORNING   mupirocin cream 2 % Commonly known as:  BACTROBAN Apply 1 application topically 2 (two) times daily.   potassium chloride 10 MEQ tablet Commonly known as:  K-DUR,KLOR-CON Take 1 tablet (10 mEq total) by mouth daily.   triamcinolone cream 0.1 % Commonly known as:  KENALOG Apply 1 application topically 2 (two) times daily.   vitamin B-12 1000 MCG tablet Commonly known as:  CYANOCOBALAMIN Take 1 tablet (1,000 mcg total) by mouth daily.   vitamin C 500 MG tablet Commonly known as:  ASCORBIC ACID Take 500 mg by mouth daily.   Vitamin D3 1000 units Caps Take 1 capsule by mouth daily.

## 2016-12-17 NOTE — Telephone Encounter (Signed)
Verification of benefits have been processed and an approval has been received for pts prolia injection. Pts estimated cost are appx $220. Pt also has $15 deductible, if she has not met it already. This is only an estimate and cannot be confirmed until benefits are paid. Please advise pt and schedule if needed. If scheduled, once the injection is received, pls contact me back with the date it was received so that I am able to update prolia folder. thanks   [facility] to schedule Ca lab, if pt is agreeable

## 2016-12-22 NOTE — Telephone Encounter (Signed)
Spoke with patient who is not sure if she could afford her part of the cost.  She will speak with her daughter and call back with her decision.

## 2017-05-10 ENCOUNTER — Encounter: Payer: Self-pay | Admitting: Gastroenterology

## 2017-05-11 ENCOUNTER — Ambulatory Visit (INDEPENDENT_AMBULATORY_CARE_PROVIDER_SITE_OTHER): Payer: Medicare Other | Admitting: Family Medicine

## 2017-05-11 ENCOUNTER — Encounter: Payer: Self-pay | Admitting: Family Medicine

## 2017-05-11 VITALS — BP 132/68 | HR 76 | Temp 98.0°F | Wt 103.5 lb

## 2017-05-11 DIAGNOSIS — M25511 Pain in right shoulder: Secondary | ICD-10-CM | POA: Diagnosis not present

## 2017-05-11 DIAGNOSIS — E785 Hyperlipidemia, unspecified: Secondary | ICD-10-CM | POA: Diagnosis not present

## 2017-05-11 DIAGNOSIS — I10 Essential (primary) hypertension: Secondary | ICD-10-CM | POA: Diagnosis not present

## 2017-05-11 DIAGNOSIS — Z23 Encounter for immunization: Secondary | ICD-10-CM | POA: Diagnosis not present

## 2017-05-11 DIAGNOSIS — D509 Iron deficiency anemia, unspecified: Secondary | ICD-10-CM

## 2017-05-11 DIAGNOSIS — E538 Deficiency of other specified B group vitamins: Secondary | ICD-10-CM

## 2017-05-11 DIAGNOSIS — M810 Age-related osteoporosis without current pathological fracture: Secondary | ICD-10-CM | POA: Diagnosis not present

## 2017-05-11 LAB — IBC PANEL
IRON: 79 ug/dL (ref 42–145)
Saturation Ratios: 23.6 % (ref 20.0–50.0)
Transferrin: 239 mg/dL (ref 212.0–360.0)

## 2017-05-11 LAB — CBC WITH DIFFERENTIAL/PLATELET
Basophils Absolute: 0 10*3/uL (ref 0.0–0.1)
Basophils Relative: 0.9 % (ref 0.0–3.0)
Eosinophils Absolute: 0.1 10*3/uL (ref 0.0–0.7)
Eosinophils Relative: 1.1 % (ref 0.0–5.0)
HCT: 36 % (ref 36.0–46.0)
Hemoglobin: 11.6 g/dL — ABNORMAL LOW (ref 12.0–15.0)
LYMPHS ABS: 0.8 10*3/uL (ref 0.7–4.0)
Lymphocytes Relative: 16.1 % (ref 12.0–46.0)
MCHC: 32.2 g/dL (ref 30.0–36.0)
MCV: 93.6 fl (ref 78.0–100.0)
MONOS PCT: 13.5 % — AB (ref 3.0–12.0)
Monocytes Absolute: 0.7 10*3/uL (ref 0.1–1.0)
NEUTROS ABS: 3.4 10*3/uL (ref 1.4–7.7)
NEUTROS PCT: 68.4 % (ref 43.0–77.0)
PLATELETS: 251 10*3/uL (ref 150.0–400.0)
RBC: 3.84 Mil/uL — ABNORMAL LOW (ref 3.87–5.11)
RDW: 14 % (ref 11.5–15.5)
WBC: 5 10*3/uL (ref 4.0–10.5)

## 2017-05-11 LAB — BASIC METABOLIC PANEL
BUN: 23 mg/dL (ref 6–23)
CALCIUM: 9.4 mg/dL (ref 8.4–10.5)
CHLORIDE: 101 meq/L (ref 96–112)
CO2: 26 meq/L (ref 19–32)
Creatinine, Ser: 1.01 mg/dL (ref 0.40–1.20)
GFR: 68.49 mL/min (ref >60.00)
GLUCOSE: 103 mg/dL — AB (ref 70–99)
POTASSIUM: 3.8 meq/L (ref 3.5–5.1)
SODIUM: 138 meq/L (ref 135–145)

## 2017-05-11 LAB — VITAMIN B12: Vitamin B-12: 1136 pg/mL — ABNORMAL HIGH (ref 211–911)

## 2017-05-11 LAB — FERRITIN: Ferritin: 262.3 ng/mL (ref 10.0–291.0)

## 2017-05-11 LAB — VITAMIN D 25 HYDROXY (VIT D DEFICIENCY, FRACTURES): VITD: 47.21 ng/mL (ref 30.00–100.00)

## 2017-05-11 MED ORDER — HYDROCHLOROTHIAZIDE 25 MG PO TABS: 25.0000 mg | ORAL_TABLET | Freq: Every morning | ORAL | 1 refills | Status: DC

## 2017-05-11 MED ORDER — ENALAPRIL MALEATE 20 MG PO TABS: 20.0000 mg | ORAL_TABLET | Freq: Every day | ORAL | 1 refills | Status: DC

## 2017-05-11 MED ORDER — POTASSIUM CHLORIDE CRYS ER 10 MEQ PO TBCR: 10.0000 meq | EXTENDED_RELEASE_TABLET | Freq: Every day | ORAL | 1 refills | Status: DC

## 2017-05-11 MED ORDER — ACETAMINOPHEN 500 MG PO TABS: 500.0000 mg | ORAL_TABLET | Freq: Four times a day (QID) | ORAL | 0 refills | Status: AC | PRN

## 2017-05-11 NOTE — Progress Notes (Signed)
BP 132/68 (BP Location: Left Arm, Patient Position: Sitting, Cuff Size: Normal)   Pulse 76   Temp 98 F (36.7 C) (Oral)   Wt 103 lb 8 oz (46.9 kg)   SpO2 98%   BMI 19.72 kg/m    CC: 6 mo f/u visit Subjective:    Patient ID: Margaret Nguyen, female    DOB: 1941-02-27, 76 y.o.   MRN: [id number]  HPI: Margaret Nguyen is a 76 y.o. female presenting on 05/11/2017 for 6 mo follow-up (Requests 90 day rx for meds)   5d h/o R shoulder pain without inciting trauma/injury. Pain starts in neck and travels down shoulder. Tylenol not helping. Asks about aleve.   IDA - due for repeat blood work. Compliant with iron, vit C.  B12 deficiency - she is taking b12 daily.  Asks about coming off meds.   Osteoporosis - unable to afford prolia.   Relevant past medical, surgical, family and social history reviewed and updated as indicated. Interim medical history since our last visit reviewed. Allergies and medications reviewed and updated. Outpatient Medications Prior to Visit  Medication Sig Dispense Refill  . Calcium Carb-Cholecalciferol (CALCIUM 500 +D) 500-400 MG-UNIT TABS Take by mouth.      . Cholecalciferol (VITAMIN D3) 1000 UNITS CAPS Take 1 capsule by mouth daily.    Marland Kitchen denosumab (PROLIA) 60 MG/ML SOLN injection Inject 60 mg into the skin every 3 (three) months. Administer in upper arm, thigh, or abdomen    . ferrous sulfate 325 (65 FE) MG tablet Take 1 tablet (325 mg total) by mouth daily with breakfast.  3  . Multiple Vitamins-Minerals (CENTRUM PO) Take by mouth.      . vitamin B-12 (CYANOCOBALAMIN) 1000 MCG tablet Take 1 tablet (1,000 mcg total) by mouth daily.    . vitamin C (ASCORBIC ACID) 500 MG tablet Take 500 mg by mouth daily.    . diclofenac (VOLTAREN) 75 MG EC tablet Take 1 tablet (75 mg total) by mouth 2 (two) times daily. 60 tablet 0  . enalapril (VASOTEC) 20 MG tablet Take 1 tablet (20 mg total) by mouth daily. 90 tablet 1  . fish oil-omega-3 fatty acids 1000 MG  capsule Take 1 g by mouth daily.     . hydrochlorothiazide (HYDRODIURIL) 25 MG tablet TAKE 1 TABLET BY MOUTH EVERY MORNING 90 tablet 2  . mupirocin cream (BACTROBAN) 2 % Apply 1 application topically 2 (two) times daily. 15 g 0  . potassium chloride (K-DUR,KLOR-CON) 10 MEQ tablet Take 1 tablet (10 mEq total) by mouth daily. 90 tablet 1  . triamcinolone cream (KENALOG) 0.1 % Apply 1 application topically 2 (two) times daily. 60 g 1   No facility-administered medications prior to visit.      Per HPI unless specifically indicated in ROS section below Review of Systems     Objective:    BP 132/68 (BP Location: Left Arm, Patient Position: Sitting, Cuff Size: Normal)   Pulse 76   Temp 98 F (36.7 C) (Oral)   Wt 103 lb 8 oz (46.9 kg)   SpO2 98%   BMI 19.72 kg/m   Wt Readings from Last 3 Encounters:  05/11/17 103 lb 8 oz (46.9 kg)  12/03/16 103 lb 8 oz (46.9 kg)  11/09/16 106 lb 4 oz (48.2 kg)    Physical Exam  Constitutional: She appears well-developed and well-nourished. No distress.  HENT:  Mouth/Throat: Oropharynx is clear and moist. No oropharyngeal exudate.  Cardiovascular: Normal rate, regular  rhythm, normal heart sounds and intact distal pulses.   No murmur heard. Frequent extrasystoles  Pulmonary/Chest: Effort normal and breath sounds normal. No respiratory distress. She has no wheezes. She has no rales.  Musculoskeletal: She exhibits no edema.  FROM at neck, discomfort to palpation at R belly of trapezius mm R shoulder WNL L Shoulder exam: No deformity of shoulders on inspection. FROM in abduction and forward flexion with crepitus noted at R. No significant pain or weakness with testing SITS in ext/int rotation. No pain with empty can sign. No impingement. No pain with rotation of humeral head in [facility] joint - with cracking noted.   Skin: Skin is warm and dry. No rash noted.  Psychiatric: She has a normal mood and affect.  Nursing note and vitals reviewed.  Results  for orders placed or performed in visit on 11/12/16  HM MAMMOGRAPHY  Result Value Ref Range   HM Mammogram 0-4 Bi-Rad 0-4 Bi-Rad, Self Reported Normal      Assessment & Plan:  Over 25 minutes were spent face-to-face with the patient during this encounter and >50% of that time was spent on counseling and coordination of care  Problem List Items Addressed This Visit    Acute pain of right shoulder    Acute, anticipate cervical neck strain as well as possible rotator cuff inflammation. rec tylenol, provided with SM pt advisor exercises for neck strain and RTC injury (along with resistance band).      Dyslipidemia    Recent chol levels great control - will stop fish oil and reassess. She does not like taking this pill due to size of pill.       Essential hypertension    Chronic, stable. Update labs.      Relevant Medications   enalapril (VASOTEC) 20 MG tablet   hydrochlorothiazide (HYDRODIURIL) 25 MG tablet   Other Relevant Orders   Basic metabolic panel   Iron deficiency anemia - Primary    Reports compliance with iron tablets daily. Update labs today then decide need for continued replacement.       Relevant Orders   CBC with Differential/Platelet   Ferritin   IBC panel   Osteoporosis    Check vit D. Doesn't remember to take oral therapy. prolia was too expensive. Consider IV bisphosphonate.       Relevant Orders   VITAMIN D 25 Hydroxy (Vit-D Deficiency, Fractures)   Vitamin B12 deficiency    Reports compliance w/ 1014mcg daily. Update B12 level.       Relevant Orders   Vitamin B12    Other Visit Diagnoses    Need for immunization against influenza       Relevant Orders   Flu Vaccine QUAD 6+ mos PF IM (Fluarix Quad PF)       Follow up plan: Return in about 6 months (around 11/09/2017) for medicare wellness visit, follow up visit.  Ria Bush, MD

## 2017-05-11 NOTE — Assessment & Plan Note (Signed)
Reports compliance w/ 1039mcg daily. Update B12 level.

## 2017-05-11 NOTE — Assessment & Plan Note (Signed)
Reports compliance with iron tablets daily. Update labs today then decide need for continued replacement.

## 2017-05-11 NOTE — Patient Instructions (Addendum)
Ok to come off fish oil.  Blood work today.  Try strengthening exercises for right rotator cuff.  We will be in touch with results and plan for iron tablets.  Return in 6 months for medicare wellness visit with Katha Cabal and follow up with me

## 2017-05-11 NOTE — Assessment & Plan Note (Signed)
Acute, anticipate cervical neck strain as well as possible rotator cuff inflammation. rec tylenol, provided with SM pt advisor exercises for neck strain and RTC injury (along with resistance band).

## 2017-05-11 NOTE — Assessment & Plan Note (Addendum)
Recent chol levels great control - will stop fish oil and reassess. She does not like taking this pill due to size of pill.

## 2017-05-11 NOTE — Assessment & Plan Note (Signed)
Chronic, stable. Update labs.  

## 2017-05-11 NOTE — Assessment & Plan Note (Signed)
Check vit D. Doesn't remember to take oral therapy. prolia was too expensive. Consider IV bisphosphonate.

## 2017-05-16 ENCOUNTER — Other Ambulatory Visit: Payer: Self-pay | Admitting: Family Medicine

## 2017-05-16 MED ORDER — VITAMIN B-12 500 MCG PO TABS: 500.0000 ug | ORAL_TABLET | Freq: Every day | ORAL | Status: DC

## 2017-08-23 ENCOUNTER — Other Ambulatory Visit: Payer: Self-pay | Admitting: Family Medicine

## 2017-08-23 MED ORDER — ENALAPRIL MALEATE 20 MG PO TABS: 20.0000 mg | ORAL_TABLET | Freq: Every day | ORAL | 1 refills | Status: DC

## 2017-08-23 NOTE — Telephone Encounter (Addendum)
I spoke with Margaret Nguyen at [place] and I spoke with [facility] [address]; Refills were transferred to [facility] On potassium 10 meq. Per DPR left v/m that [facility] is processing refill requests.

## 2017-08-23 NOTE — Telephone Encounter (Signed)
Copied from [place]. Topic: Quick Communication - Rx Refill/Question >> Aug 23, 2017 10:19 AM Celedonio Savage L wrote: Medication: enalapril (VASOTEC) 20 MG tablet  potassium chloride (K-DUR,KLOR-CON) 10 MEQ tablet       patient is out of these two medicines   Has the patient contacted their pharmacy? Yes.  She called today   (Agent: If no, request that the patient contact the pharmacy for the refill.)   Preferred Pharmacy (with phone number or street name): [id number] - [place], [place] [phone] (Phone) [phone] (Fax)     Agent: Please be advised that RX refills may take up to 3 business days. We ask that you follow-up with your pharmacy.

## 2017-08-23 NOTE — Telephone Encounter (Signed)
KDur  refill Last OV: 05/11/17; last labs with K 3.8 on 05/11/17  Last Refill:05/11/17 90 tab/1 refill Pharmacy:[facility] [phone]  Enalapril refilled.

## 2017-10-30 ENCOUNTER — Other Ambulatory Visit: Payer: Self-pay

## 2017-10-30 ENCOUNTER — Ambulatory Visit (INDEPENDENT_AMBULATORY_CARE_PROVIDER_SITE_OTHER): Payer: Medicare Other

## 2017-10-30 ENCOUNTER — Ambulatory Visit (HOSPITAL_COMMUNITY)
Admission: EM | Admit: 2017-10-30 | Discharge: 2017-10-30 | Disposition: A | Discharge status: Home / Self Care | Payer: Medicare Other | Attending: Family Medicine | Admitting: Family Medicine

## 2017-10-30 ENCOUNTER — Encounter (HOSPITAL_COMMUNITY): Payer: Self-pay | Admitting: *Deleted

## 2017-10-30 DIAGNOSIS — M79672 Pain in left foot: Secondary | ICD-10-CM | POA: Diagnosis not present

## 2017-10-30 DIAGNOSIS — M79671 Pain in right foot: Secondary | ICD-10-CM

## 2017-10-30 MED ORDER — DICLOFENAC SODIUM 75 MG PO TBEC: 75.0000 mg | DELAYED_RELEASE_TABLET | Freq: Two times a day (BID) | ORAL | 0 refills | Status: DC

## 2017-10-30 NOTE — ED Provider Notes (Signed)
[place]    CSN: [id number] Arrival date & time: 10/30/17  1651     History   Chief Complaint Chief Complaint  Patient presents with  . Foot Pain    HPI Margaret Nguyen is a 77 y.o. female.   [name] presents with complaints of bilateral foot pain. Started out as right foot pain which she woke with approximately 5 days ago with pain, redness and swelling. Pain with weight bearing. Has been taking tylenol arthritis which has not helped. She states she feels she has been favoring her right foot so now she has more pain today to the top of her left foot. No known injury. She has a history of osteoporosis and arthritis as well as murmur and htn. No known history of gout. Has had a pin to her right great toe. Without numbness or tingling. Right foot pain improving. No fevers. Sensation intact. Ambulatory.   ROS per HPI.      Past Medical History:  Diagnosis Date  . Allergy   . Arthritis   . Diverticulitis   . Family hx colonic polyps   . Heart murmur   . Hiatal hernia   . Hypertension   . Osteoporosis 08/2009   R femur -2.5  . Villous adenoma of colon     Patient Active Problem List   Diagnosis Date Noted  . Acute pain of right shoulder 05/11/2017  . Iron deficiency anemia 11/09/2016  . Vitamin B12 deficiency 11/09/2016  . Dyslipidemia 11/02/2016  . Medicare annual wellness visit, subsequent 09/09/2015  . Advanced care planning/counseling discussion 09/09/2015  . Diverticulosis of colon without hemorrhage 09/09/2015  . Right sided sciatica 01/16/2014  . Routine general medical examination at a health care facility 01/24/2011  . Osteoporosis 08/13/2009  . ALOPECIA 02/28/2008  . Essential hypertension 05/09/2007  . DEGENERATIVE JOINT DISEASE, LEFT HIP 05/09/2007  . Adenomatous colon polyp 05/09/2007    Past Surgical History:  Procedure Laterality Date  . ABDOMINAL HYSTERECTOMY    . COLONOSCOPY  04/2010   polyps, diverticulosis, hem, rec rpt 3  yrs [facility])  . COLONOSCOPY  04/2014   mult TA, one with high grade dysplasia, mod diverticulosis, rpt 5 yrs [facility])  . DEXA  08/2009   femur -2.5  . DEXA  04/2015   T -3 hips, -1.8 spine  . LAPAROSCOPIC SIGMOID COLECTOMY     villous adenoma  . PARTIAL HYSTERECTOMY     ovaries remain  . TOE SURGERY    . TUBAL LIGATION      OB History   None      Home Medications    Prior to Admission medications   Medication Sig Start Date End Date Taking? Authorizing Provider  Calcium Carb-Cholecalciferol (CALCIUM 500 +D) 500-400 MG-UNIT TABS Take by mouth.     Yes [provider]  Cholecalciferol (VITAMIN D3) 1000 UNITS CAPS Take 1 capsule by mouth daily.   Yes [provider]  enalapril (VASOTEC) 20 MG tablet Take 1 tablet (20 mg total) by mouth daily. 08/23/17  Yes Ria Bush, MD  hydrochlorothiazide (HYDRODIURIL) 25 MG tablet Take 1 tablet (25 mg total) by mouth every morning. 05/11/17  Yes Ria Bush, MD  potassium chloride (K-DUR,KLOR-CON) 10 MEQ tablet Take 1 tablet (10 mEq total) by mouth daily. 05/11/17  Yes Ria Bush, MD  vitamin C (ASCORBIC ACID) 500 MG tablet Take 500 mg by mouth daily.   Yes [provider]  acetaminophen (TYLENOL) 500 MG tablet Take 1 tablet (500  mg total) by mouth every 6 (six) hours as needed. 05/11/17   Ria Bush, MD  denosumab (PROLIA) 60 MG/ML SOLN injection Inject 60 mg into the skin every 3 (three) months. Administer in upper arm, thigh, or abdomen 11/09/16   Ria Bush, MD  diclofenac (VOLTAREN) 75 MG EC tablet Take 1 tablet (75 mg total) by mouth 2 (two) times daily. 10/30/17   Zigmund Gottron, NP  Multiple Vitamins-Minerals (CENTRUM PO) Take by mouth.      [provider]  vitamin B-12 (CYANOCOBALAMIN) 500 MCG tablet Take 1 tablet (500 mcg total) daily by mouth. Patient taking differently: Take 500 mcg by mouth 2 (two) times a week.  05/16/17   Ria Bush, MD    Family  History Family History  Problem Relation Age of Onset  . Arthritis Mother   . Arthritis Father   . Heart disease Father   . Hypertension Father   . Colon cancer Neg Hx     Social History Social History   Tobacco Use  . Smoking status: Never Smoker  . Smokeless tobacco: Never Used  Substance Use Topics  . Alcohol use: Yes    Comment: occasional  . Drug use: Never     Allergies   Other   Review of Systems Review of Systems   Physical Exam Triage Vital Signs ED Triage Vitals  Enc Vitals Group     BP 10/30/17 1729 (!) 161/70     Pulse Rate 10/30/17 1729 85     Resp 10/30/17 1729 16     Temp 10/30/17 1729 98.3 F (36.8 C)     Temp Source 10/30/17 1729 Oral     SpO2 10/30/17 1729 100 %     Weight --      Height --      Head Circumference --      Peak Flow --      Pain Score 10/30/17 1730 9     Pain Loc --      Pain Edu? --      Excl. in [place]? --    No data found.  Updated Vital Signs BP (!) 161/70   Pulse 85   Temp 98.3 F (36.8 C) (Oral)   Resp 16   SpO2 100%   Visual Acuity Right Eye Distance:   Left Eye Distance:   Bilateral Distance:    Right Eye Near:   Left Eye Near:    Bilateral Near:     Physical Exam  Constitutional: She appears well-developed.  Cardiovascular: Normal rate and regular rhythm.  Pulmonary/Chest: Effort normal.  Musculoskeletal:       Right ankle: She exhibits normal range of motion and no swelling. No tenderness.       Left ankle: She exhibits normal range of motion and no swelling. No tenderness.       Right foot: There is tenderness. There is normal range of motion, no swelling, normal capillary refill, no crepitus, no deformity and no laceration.       Feet:  Right first metatarsal with redness and mild tenderness on exam; strong pedal pulses; full ROm to right ankle; mild pain with movement of toes; without pain with passive ROM to MTP joint; left foot with complete ankle ROM and toe ROM; tenderness generalized to  proximal dorsal foot across proximal approximately 2-5 metatarsal's with mild redness; strong pedal pulses; cap refill equal bilaterally; see photos although left foot redness not as well captured   Vitals reviewed.  UC Treatments / Results  Labs (all labs ordered are listed, but only abnormal results are displayed) Labs Reviewed - No data to display  EKG None Radiology Dg Foot Complete Left  Result Date: 10/30/2017 CLINICAL DATA:  Bilateral foot pain, no known injury, initial encounter EXAM: LEFT FOOT - COMPLETE 3+ VIEW COMPARISON:  None. FINDINGS: No acute fracture or dislocation is noted. Changes of prior second metatarsal fracture with healing are seen. Tarsal degenerative changes are noted. No soft tissue changes are seen. IMPRESSION: Chronic changes without acute abnormality. Electronically Signed   By: Inez Catalina M.D.   On: 10/30/2017 18:42   Dg Foot Complete Right  Result Date: 10/30/2017 CLINICAL DATA:  Foot pain for 1 week, no known injury, initial encounter EXAM: RIGHT FOOT COMPLETE - 3+ VIEW COMPARISON:  None. FINDINGS: Postsurgical changes in the first metatarsal are noted. No acute fracture or dislocation is noted. IMPRESSION: No acute abnormality noted. Electronically Signed   By: Inez Catalina M.D.   On: 10/30/2017 18:35    Procedures Procedures (including critical care time)  Medications Ordered in UC Medications - No data to display   Initial Impression / Assessment and Plan / UC Course  I have reviewed the triage vital signs and the nursing notes.  Pertinent labs & imaging results that were available during my care of the patient were reviewed by me and considered in my medical decision making (see chart for details).     No known injury. Right foot pain improving. History of osteoporosis, bilateral xrays obtained. Does not appear vascular related as it is quite specific areas affected rather than generalized foot. Without warmth or swelling  concerning for infectious process at this time. No history of gout. Will provide nsaids at this time, activity as tolerated, elevate and ice. Follow up with PCP next week for recheck.   Final Clinical Impressions(s) / UC Diagnoses   Final diagnoses:  Foot pain, right  Foot pain, left    ED Discharge Orders        Ordered    diclofenac (VOLTAREN) 75 MG EC tablet  2 times daily     10/30/17 1844       Controlled Substance Prescriptions [place] Controlled Substance Registry consulted? Not Applicable   Zigmund Gottron, NP 10/30/17 1845

## 2017-10-30 NOTE — Discharge Instructions (Signed)
Ice and elevation to help with pain and swelling. Diclofenac twice a day, take with food, drink plenty of water. Activity as tolerated. Please follow up with your primary care provider next week for recheck of symptoms. If worsening pain, redness, sensation or fevers please return or go to the Er.

## 2017-10-30 NOTE — ED Triage Notes (Signed)
C/O starting with right foot pain approx 5 days ago without known injury.  Now also c/o left foot pain.

## 2017-11-08 ENCOUNTER — Encounter: Payer: Self-pay | Admitting: Family Medicine

## 2017-11-08 ENCOUNTER — Ambulatory Visit (INDEPENDENT_AMBULATORY_CARE_PROVIDER_SITE_OTHER): Payer: Medicare Other | Admitting: Family Medicine

## 2017-11-08 VITALS — BP 150/70 | HR 70 | Temp 98.3°F | Ht 61.0 in | Wt 103.5 lb

## 2017-11-08 DIAGNOSIS — M810 Age-related osteoporosis without current pathological fracture: Secondary | ICD-10-CM

## 2017-11-08 DIAGNOSIS — M79671 Pain in right foot: Secondary | ICD-10-CM | POA: Insufficient documentation

## 2017-11-08 DIAGNOSIS — M79672 Pain in left foot: Secondary | ICD-10-CM | POA: Diagnosis not present

## 2017-11-08 MED ORDER — COLCHICINE 0.6 MG PO TABS: 0.6000 mg | ORAL_TABLET | Freq: Every day | ORAL | 0 refills | Status: DC | PRN

## 2017-11-08 NOTE — Assessment & Plan Note (Addendum)
Again discussed options - did not remember to take weekly bisphosphonate, prolia was too expensive. Agrees to see if yearly zolendronic acid (Reclast) will be covered by insurance. Worried about cost.

## 2017-11-08 NOTE — Progress Notes (Signed)
BP (!) 150/70 (BP Location: Right Leg, Cuff Size: Normal)   Pulse 70   Temp 98.3 F (36.8 C) (Oral)   Ht 5\' 1"  (1.549 m)   Wt 103 lb 8 oz (46.9 kg)   SpO2 98%   BMI 19.56 kg/m    CC: UCC f/u visit Subjective:    Patient ID: Margaret Nguyen, female    DOB: 1941-06-08, 77 y.o.   MRN: [id number]  HPI: Margaret Nguyen is a 77 y.o. female presenting on 11/08/2017 for Urgent Care follow-up (Seen at [facility] UC on 10/30/17, primary dx- right foot pain. Pt provided visit summary.)   Recent eval at [facility] with R>L foot pain - records reviewed. In h/o osteoporosis, xrays were obtained and negative. Thought possible gout - treated with diclofenac NSAID prn.   Endorses 2.5 wk h/o R foot pain/swelling then spreading to left foot. Worse with prolonged standing. Entire foot swelling. She had redness and swelling at R 1st MTPJ as well as lateral left midfoot.  Treated with epsom salt soaks.  Denies inciting trauma/injury or falls.  Some itching of feet.  She did recently eat more organ meats (liver).   Osteoporosis - not on anything currently other than calcium/vit D  Relevant past medical, surgical, family and social history reviewed and updated as indicated. Interim medical history since our last visit reviewed. Allergies and medications reviewed and updated. Outpatient Medications Prior to Visit  Medication Sig Dispense Refill  . acetaminophen (TYLENOL) 500 MG tablet Take 1 tablet (500 mg total) by mouth every 6 (six) hours as needed. 30 tablet 0  . Calcium Carb-Cholecalciferol (CALCIUM 500 +D) 500-400 MG-UNIT TABS Take by mouth.      . Cholecalciferol (VITAMIN D3) 1000 UNITS CAPS Take 1 capsule by mouth daily.    . enalapril (VASOTEC) 20 MG tablet Take 1 tablet (20 mg total) by mouth daily. 90 tablet 1  . hydrochlorothiazide (HYDRODIURIL) 25 MG tablet Take 1 tablet (25 mg total) by mouth every morning. 90 tablet 1  . Multiple Vitamins-Minerals (CENTRUM PO) Take by mouth.      .  potassium chloride (K-DUR,KLOR-CON) 10 MEQ tablet Take 1 tablet (10 mEq total) by mouth daily. 90 tablet 1  . vitamin B-12 (CYANOCOBALAMIN) 500 MCG tablet Take 1 tablet (500 mcg total) daily by mouth. (Patient taking differently: Take 500 mcg by mouth 2 (two) times a week. Takes 1/2 tablet)    . vitamin C (ASCORBIC ACID) 500 MG tablet Take 500 mg by mouth daily.    . zoledronic acid (RECLAST) 5 MG/100ML SOLN injection Inject 100 mLs (5 mg total) into the vein as directed. Yearly at short stay    . denosumab (PROLIA) 60 MG/ML SOLN injection Inject 60 mg into the skin every 3 (three) months. Administer in upper arm, thigh, or abdomen    . diclofenac (VOLTAREN) 75 MG EC tablet Take 1 tablet (75 mg total) by mouth 2 (two) times daily. 20 tablet 0   No facility-administered medications prior to visit.      Per HPI unless specifically indicated in ROS section below Review of Systems     Objective:    BP (!) 150/70 (BP Location: Right Leg, Cuff Size: Normal)   Pulse 70   Temp 98.3 F (36.8 C) (Oral)   Ht 5\' 1"  (1.549 m)   Wt 103 lb 8 oz (46.9 kg)   SpO2 98%   BMI 19.56 kg/m   Wt Readings from Last 3 Encounters:  11/08/17  103 lb 8 oz (46.9 kg)  05/11/17 103 lb 8 oz (46.9 kg)  12/03/16 103 lb 8 oz (46.9 kg)    Physical Exam  Constitutional: She appears well-developed and well-nourished. No distress.  Musculoskeletal: She exhibits no edema.  2+ DP bilaterally Mild discomfort to palpation R 1st MTPJ, discomfort also at 3rd L mid metatarsal shaft No ligament laxity bilaterally No pain at achilles tendon bilaterally Neg calcaneal squeeze test bilaterally No significant erythema, swelling or warmth today  Skin: Skin is warm and dry. No erythema.  Nursing note and vitals reviewed.  Results for orders placed or performed in visit on 05/11/17  CBC with Differential/Platelet  Result Value Ref Range   WBC 5.0 4.0 - 10.5 K/uL   RBC 3.84 (L) 3.87 - 5.11 Mil/uL   Hemoglobin 11.6 (L) 12.0 -  15.0 g/dL   HCT 36.0 36.0 - 46.0 %   MCV 93.6 78.0 - 100.0 fl   MCHC 32.2 30.0 - 36.0 g/dL   RDW 14.0 11.5 - 15.5 %   Platelets 251.0 150.0 - 400.0 K/uL   Neutrophils Relative % 68.4 43.0 - 77.0 %   Lymphocytes Relative 16.1 12.0 - 46.0 %   Monocytes Relative 13.5 (H) 3.0 - 12.0 %   Eosinophils Relative 1.1 0.0 - 5.0 %   Basophils Relative 0.9 0.0 - 3.0 %   Neutro Abs 3.4 1.4 - 7.7 K/uL   Lymphs Abs 0.8 0.7 - 4.0 K/uL   Monocytes Absolute 0.7 0.1 - 1.0 K/uL   Eosinophils Absolute 0.1 0.0 - 0.7 K/uL   Basophils Absolute 0.0 0.0 - 0.1 K/uL  Vitamin B12  Result Value Ref Range   Vitamin B-12 1,136 (H) 211 - 911 pg/mL  Ferritin  Result Value Ref Range   Ferritin 262.3 10.0 - 291.0 ng/mL  IBC panel  Result Value Ref Range   Iron 79 42 - 145 ug/dL   Transferrin 239.0 212.0 - 360.0 mg/dL   Saturation Ratios 23.6 20.0 - 50.0 %  VITAMIN D 25 Hydroxy (Vit-D Deficiency, Fractures)  Result Value Ref Range   VITD 47.21 30.00 - 100.00 ng/mL  Basic metabolic panel  Result Value Ref Range   Sodium 138 135 - 145 mEq/L   Potassium 3.8 3.5 - 5.1 mEq/L   Chloride 101 96 - 112 mEq/L   CO2 26 19 - 32 mEq/L   Glucose, Bld 103 (H) 70 - 99 mg/dL   BUN 23 6 - 23 mg/dL   Creatinine, Ser 1.01 0.40 - 1.20 mg/dL   Calcium 9.4 8.4 - 10.5 mg/dL   GFR 68.49 >60.00 mL/min   Lab Results  Component Value Date   LABURIC 7.0 11/02/2016      Assessment & Plan:   Problem List Items Addressed This Visit    Bilateral foot pain - Primary    Unclear cause. Benign exam today. Possible gout - she had recently consumed higher purine diet.  Will finish diclofenac and not refill (in h/o worsening HTN)  Rx colchicine to use PRN. Discussed low purine diet, handout provided.  If recurrent flare, consider daily urate lowering medication.       Osteoporosis    Again discussed options - did not remember to take weekly bisphosphonate, prolia was too expensive. Agrees to see if yearly zolendronic acid (Reclast)  will be covered by insurance. Worried about cost.       Relevant Medications   zoledronic acid (RECLAST) 5 MG/100ML SOLN injection      Meds ordered this  encounter  Medications  . colchicine 0.6 MG tablet    Sig: Take 1 tablet (0.6 mg total) by mouth daily as needed (foot/joint pain).    Dispense:  30 tablet    Refill:  0   No orders of the defined types were placed in this encounter.   Follow up plan: Return in about 2 months (around 01/08/2018) for medicare wellness visit.  Ria Bush, MD

## 2017-11-08 NOTE — Patient Instructions (Addendum)
We will work on prior authorization for once yearly injectable medicine for osteoporosis (zolendronic acid IV) through short stay at the hospital.  Possible gout of feet. Ok to complete diclofenac course. Treat further pain with 1 colchcine pill as needed.  Let us know if not improving each day.  Return in next 1-2 months for wellness visit.   Low-Purine Diet Purines are compounds that affect the level of uric acid in your body. A low-purine diet is a diet that is low in purines. Eating a low-purine diet can prevent the level of uric acid in your body from getting too high and causing gout or kidney stones or both. What do I need to know about this diet?  Choose low-purine foods. Examples of low-purine foods are listed in the next section.  Drink plenty of fluids, especially water. Fluids can help remove uric acid from your body. Try to drink 8-16 cups (1.9-3.8 L) a day.  Limit foods high in fat, especially saturated fat, as fat makes it harder for the body to get rid of uric acid. Foods high in saturated fat include pizza, cheese, ice cream, whole milk, fried foods, and gravies. Choose foods that are lower in fat and lean sources of protein. Use olive oil when cooking as it contains healthy fats that are not high in saturated fat.  Limit alcohol. Alcohol interferes with the elimination of uric acid from your body. If you are having a gout attack, avoid all alcohol.  Keep in mind that different people's bodies react differently to different foods. You will probably learn over time which foods do or do not affect you. If you discover that a food tends to cause your gout to flare up, avoid eating that food. You can more freely enjoy foods that do not cause problems. If you have any questions about a food item, talk to your dietitian or health care provider. Which foods are low, moderate, and high in purines? The following is a list of foods that are low, moderate, and high in purines. You can eat  any amount of the foods that are low in purines. You may be able to have small amounts of foods that are moderate in purines. Ask your health care provider how much of a food moderate in purines you can have. Avoid foods high in purines. Grains  Foods low in purines: Enriched white bread, pasta, rice, cake, cornbread, popcorn.  Foods moderate in purines: Whole-grain breads and cereals, wheat germ, bran, oatmeal. Uncooked oatmeal. Dry wheat bran or wheat germ.  Foods high in purines: Pancakes, [place] toast, biscuits, muffins. Vegetables  Foods low in purines: All vegetables, except those that are moderate in purines.  Foods moderate in purines: Asparagus, cauliflower, spinach, mushrooms, green peas. Fruits  All fruits are low in purines. Meats and other Protein Foods  Foods low in purines: Eggs, nuts, peanut butter.  Foods moderate in purines: 80-90% lean beef, lamb, veal, pork, poultry, fish, eggs, peanut butter, nuts. Crab, lobster, oysters, and shrimp. Cooked dried beans, peas, and lentils.  Foods high in purines: Anchovies, sardines, herring, mussels, tuna, codfish, scallops, trout, and haddock. Berniece Salines. Organ meats (such as liver or kidney). Tripe. Game meat. Goose. Sweetbreads. Dairy  All dairy foods are low in purines. Low-fat and fat-free dairy products are best because they are low in saturated fat. Beverages  Drinks low in purines: Water, carbonated beverages, tea, coffee, cocoa.  Drinks moderate in purines: Soft drinks and other drinks sweetened with high-fructose corn syrup. Juices.  To find whether a food or drink is sweetened with high-fructose corn syrup, look at the ingredients list.  Drinks high in purines: Alcoholic beverages (such as beer). Condiments  Foods low in purines: Salt, herbs, olives, pickles, relishes, vinegar.  Foods moderate in purines: Butter, margarine, oils, mayonnaise. Fats and Oils  Foods low in purines: All types, except gravies and sauces  made with meat.  Foods high in purines: Gravies and sauces made with meat. Other Foods  Foods low in purines: Sugars, sweets, gelatin. Cake. Soups made without meat.  Foods moderate in purines: Meat-based or fish-based soups, broths, or bouillons. Foods and drinks sweetened with high-fructose corn syrup.  Foods high in purines: High-fat desserts (such as ice cream, cookies, cakes, pies, doughnuts, and chocolate). Contact your dietitian for more information on foods that are not listed here. This information is not intended to replace advice given to you by your health care provider. Make sure you discuss any questions you have with your health care provider. Document Released: 10/24/2010 Document Revised: 12/05/2015 Document Reviewed: 06/05/2013 Elsevier Interactive Patient Education  2017 [employer].

## 2017-11-08 NOTE — Assessment & Plan Note (Signed)
Unclear cause. Benign exam today. Possible gout - she had recently consumed higher purine diet.  Will finish diclofenac and not refill (in h/o worsening HTN)  Rx colchicine to use PRN. Discussed low purine diet, handout provided.  If recurrent flare, consider daily urate lowering medication.

## 2017-11-11 ENCOUNTER — Other Ambulatory Visit: Payer: Self-pay | Admitting: Family Medicine

## 2017-11-12 ENCOUNTER — Telehealth: Payer: Self-pay

## 2017-11-12 DIAGNOSIS — M810 Age-related osteoporosis without current pathological fracture: Secondary | ICD-10-CM

## 2017-11-12 NOTE — Telephone Encounter (Signed)
zolendronic acid IV soln 5mg /179mL is just generic or reclast - ok to use this. Will fill out order form when I return.

## 2017-11-12 NOTE — Telephone Encounter (Signed)
Noted  

## 2017-11-12 NOTE — Telephone Encounter (Signed)
PA for Reclast was denied stating drug drug has not met Non-Formulary Exception criteria requirements.  At least 2 alternative formulary medications in the same drug class/category have not been tried.  Alternative meds include:   -zoledronic acid iv soln 5 mg/100 ml  -zoledronic acid inj conc for iv infusion 4 mg/5 ml.

## 2017-11-23 NOTE — Telephone Encounter (Addendum)
Form filled and in Lisa's box, awaiting lab results.  plz have her come in for labwork. Thanks.

## 2017-11-23 NOTE — Telephone Encounter (Addendum)
Spoke with pt.  Scheduled for lab visit Wed, 11/24/16 @ 8:15 AM.  Form is in basket on [employer] pending lab results.

## 2017-11-23 NOTE — Addendum Note (Signed)
Addended by: Ria Bush on: 11/23/2017 07:56 AM   Modules accepted: Orders

## 2017-11-24 ENCOUNTER — Other Ambulatory Visit (INDEPENDENT_AMBULATORY_CARE_PROVIDER_SITE_OTHER): Payer: Medicare Other

## 2017-11-24 DIAGNOSIS — M810 Age-related osteoporosis without current pathological fracture: Secondary | ICD-10-CM

## 2017-11-24 LAB — BASIC METABOLIC PANEL
BUN: 27 mg/dL — AB (ref 6–23)
CALCIUM: 10.2 mg/dL (ref 8.4–10.5)
CO2: 23 meq/L (ref 19–32)
CREATININE: 1.36 mg/dL — AB (ref 0.40–1.20)
Chloride: 98 mEq/L (ref 96–112)
GFR: 48.52 mL/min — AB (ref >60.00)
GLUCOSE: 129 mg/dL — AB (ref 70–99)
Potassium: 4 mEq/L (ref 3.5–5.1)
Sodium: 135 mEq/L (ref 135–145)

## 2017-11-25 NOTE — Telephone Encounter (Signed)
Spoke with pt providing phn # [phone] to call [facility] Short Stay tomorrow to schedule appt for Reclast infusion.  Pt verbalizes understanding.

## 2017-11-25 NOTE — Telephone Encounter (Addendum)
Lab results documented on form. Faxed Reclast form to [facility] stay.

## 2017-12-02 ENCOUNTER — Other Ambulatory Visit (HOSPITAL_COMMUNITY): Payer: Self-pay | Admitting: *Deleted

## 2017-12-03 ENCOUNTER — Ambulatory Visit (HOSPITAL_COMMUNITY)
Admission: RE | Admit: 2017-12-03 | Discharge: 2017-12-03 | Disposition: A | Discharge status: Home / Self Care | Payer: Medicare Other | Source: Ambulatory Visit | Attending: Family Medicine | Admitting: Family Medicine

## 2017-12-03 DIAGNOSIS — M810 Age-related osteoporosis without current pathological fracture: Secondary | ICD-10-CM | POA: Diagnosis not present

## 2017-12-03 MED ORDER — ZOLEDRONIC ACID 5 MG/100ML IV SOLN
INTRAVENOUS | Status: AC
  Filled 2017-12-03: qty 100

## 2017-12-03 MED ORDER — ZOLEDRONIC ACID 5 MG/100ML IV SOLN
5.0000 mg | Freq: Once | INTRAVENOUS | Status: AC
  Administered 2017-12-03: 5 mg via INTRAVENOUS

## 2018-01-11 ENCOUNTER — Other Ambulatory Visit: Payer: Self-pay | Admitting: Family Medicine

## 2018-01-11 DIAGNOSIS — E538 Deficiency of other specified B group vitamins: Secondary | ICD-10-CM

## 2018-01-11 DIAGNOSIS — M810 Age-related osteoporosis without current pathological fracture: Secondary | ICD-10-CM

## 2018-01-11 DIAGNOSIS — D509 Iron deficiency anemia, unspecified: Secondary | ICD-10-CM

## 2018-01-11 DIAGNOSIS — N289 Disorder of kidney and ureter, unspecified: Secondary | ICD-10-CM

## 2018-01-12 ENCOUNTER — Ambulatory Visit (INDEPENDENT_AMBULATORY_CARE_PROVIDER_SITE_OTHER): Payer: Medicare Other

## 2018-01-12 ENCOUNTER — Ambulatory Visit (INDEPENDENT_AMBULATORY_CARE_PROVIDER_SITE_OTHER): Payer: Medicare Other | Admitting: Family Medicine

## 2018-01-12 ENCOUNTER — Encounter: Payer: Self-pay | Admitting: Family Medicine

## 2018-01-12 VITALS — BP 138/78 | HR 87 | Temp 98.6°F | Ht 60.5 in | Wt 98.5 lb

## 2018-01-12 DIAGNOSIS — Z0000 Encounter for general adult medical examination without abnormal findings: Secondary | ICD-10-CM

## 2018-01-12 DIAGNOSIS — I10 Essential (primary) hypertension: Secondary | ICD-10-CM | POA: Diagnosis not present

## 2018-01-12 DIAGNOSIS — M79671 Pain in right foot: Secondary | ICD-10-CM

## 2018-01-12 DIAGNOSIS — M810 Age-related osteoporosis without current pathological fracture: Secondary | ICD-10-CM

## 2018-01-12 DIAGNOSIS — N289 Disorder of kidney and ureter, unspecified: Secondary | ICD-10-CM

## 2018-01-12 DIAGNOSIS — E538 Deficiency of other specified B group vitamins: Secondary | ICD-10-CM

## 2018-01-12 DIAGNOSIS — R634 Abnormal weight loss: Secondary | ICD-10-CM | POA: Diagnosis not present

## 2018-01-12 DIAGNOSIS — M79672 Pain in left foot: Secondary | ICD-10-CM

## 2018-01-12 DIAGNOSIS — E785 Hyperlipidemia, unspecified: Secondary | ICD-10-CM

## 2018-01-12 DIAGNOSIS — D509 Iron deficiency anemia, unspecified: Secondary | ICD-10-CM

## 2018-01-12 DIAGNOSIS — D126 Benign neoplasm of colon, unspecified: Secondary | ICD-10-CM

## 2018-01-12 LAB — CBC WITH DIFFERENTIAL/PLATELET
BASOS ABS: 0 10*3/uL (ref 0.0–0.1)
Basophils Relative: 0.7 % (ref 0.0–3.0)
EOS PCT: 1.1 % (ref 0.0–5.0)
Eosinophils Absolute: 0.1 10*3/uL (ref 0.0–0.7)
HCT: 31.2 % — ABNORMAL LOW (ref 36.0–46.0)
Hemoglobin: 10.2 g/dL — ABNORMAL LOW (ref 12.0–15.0)
LYMPHS ABS: 0.8 10*3/uL (ref 0.7–4.0)
LYMPHS PCT: 14.3 % (ref 12.0–46.0)
MCHC: 32.7 g/dL (ref 30.0–36.0)
MCV: 91.9 fl (ref 78.0–100.0)
MONOS PCT: 10.1 % (ref 3.0–12.0)
Monocytes Absolute: 0.6 10*3/uL (ref 0.1–1.0)
NEUTROS ABS: 4.2 10*3/uL (ref 1.4–7.7)
NEUTROS PCT: 73.8 % (ref 43.0–77.0)
PLATELETS: 373 10*3/uL (ref 150.0–400.0)
RBC: 3.39 Mil/uL — AB (ref 3.87–5.11)
RDW: 14.5 % (ref 11.5–15.5)
WBC: 5.6 10*3/uL (ref 4.0–10.5)

## 2018-01-12 LAB — RENAL FUNCTION PANEL
ALBUMIN: 3.9 g/dL (ref 3.5–5.2)
BUN: 16 mg/dL (ref 6–23)
CALCIUM: 9.4 mg/dL (ref 8.4–10.5)
CO2: 26 meq/L (ref 19–32)
CREATININE: 1.12 mg/dL (ref 0.40–1.20)
Chloride: 106 mEq/L (ref 96–112)
GFR: 60.68 mL/min (ref >60.00)
Glucose, Bld: 116 mg/dL — ABNORMAL HIGH (ref 70–99)
Phosphorus: 2.7 mg/dL (ref 2.3–4.6)
Potassium: 3.6 mEq/L (ref 3.5–5.1)
Sodium: 140 mEq/L (ref 135–145)

## 2018-01-12 LAB — VITAMIN B12: Vitamin B-12: 613 pg/mL (ref 211–911)

## 2018-01-12 LAB — URIC ACID: Uric Acid, Serum: 6.4 mg/dL (ref 2.4–7.0)

## 2018-01-12 LAB — VITAMIN D 25 HYDROXY (VIT D DEFICIENCY, FRACTURES): VITD: 65.11 ng/mL (ref 30.00–100.00)

## 2018-01-12 MED ORDER — ALENDRONATE SODIUM 70 MG PO TABS: 70.0000 mg | ORAL_TABLET | ORAL | 11 refills | Status: DC

## 2018-01-12 NOTE — Assessment & Plan Note (Signed)
Overdue for repeat colonoscopy. Reviewed with patient. She will call Dr Fuller Plan to schedule.

## 2018-01-12 NOTE — Assessment & Plan Note (Signed)
Chronic, stable. Continue lisinopril and hctz. May need to come off hctz in presumed gout hx.

## 2018-01-12 NOTE — Assessment & Plan Note (Addendum)
Recurrent flares of pain, affecting sleep at times. ?gout related - recheck urate. Discussed colchicine use - she will try this at home. If urate returns high, will consider discontinuing hctz prior to starting daily urate lowering medicine.

## 2018-01-12 NOTE — Assessment & Plan Note (Signed)
Noted 5 lbs in last few months. Suggested sooner f/u to monitor this.

## 2018-01-12 NOTE — Progress Notes (Signed)
PCP notes:   Health maintenance:  Mammogram - addressed Colon cancer screening - addressed  Abnormal screenings:   Hearing - failed  Hearing Screening   125Hz  250Hz  500Hz  1000Hz  2000Hz  3000Hz  4000Hz  6000Hz  8000Hz   Right ear:   40 40 40  40    Left ear:   40 0 40  40     Mini-Cog score: 19/20 MMSE - Mini Mental State Exam 01/12/2018  Orientation to time 5  Orientation to Place 5  Registration 3  Attention/ Calculation 0  Recall 2  Recall-comments unable to recall 1 of 3 words  Language- name 2 objects 0  Language- repeat 1  Language- follow 3 step command 3  Language- read & follow direction 0  Write a sentence 0  Copy design 0  Total score 19    Patient concerns:   Patient verbalized concerns with swelling and discoloration of left foot. Patient has not been using Colchicine as prescribed.   Patient verbalized concerns about increased cramping since Reclast administration. Patient states she does not want to continue medication.  Nurse concerns:  None  Next PCP appt:   01/22/18 @ 1130

## 2018-01-12 NOTE — Progress Notes (Signed)
BP 138/78 (BP Location: Left Arm, Patient Position: Sitting, Cuff Size: Normal)   Pulse 87   Temp 98.6 F (37 C) (Oral)   Ht 5' 0.5" (1.537 m) Comment: no shoes  Wt 98 lb 8 oz (44.7 kg)   SpO2 98%   BMI 18.92 kg/m    CC: AMW Subjective:    Patient ID: Margaret Nguyen, female    DOB: [birth year], 77 y.o.   MRN: [id number]  HPI: Margaret Nguyen is a 77 y.o. female presenting on 01/12/2018 for Follow-up   Weight loss noted - 5 lbs in last few months.   Reclast injection 11/2017 - increased cramping and myalgias after injection - doesn't want to repeat.   Ongoing foot pain and swelling, worse pain after walking throughout left foot. H/o gout - has not tried colchicine. Diffuse body aches as well.   Saw Margaret Nguyen today - note will be reviewed.   Preventative: COLONOSCOPY Date: 04/2014 mult TA, one with high grade dysplasia, mod diverticulosis, rpt 3 yrs Fuller Plan).  Lung cancer screening - not candidate  Breast cancer screening - WNL 11/2016 - declines repeat today. She doesn't do breast exams at home.  Well woman exam - s/p partial hysterectomy. Ovaries remain. Denies pelvic pain or presure.  DEXA Date: 04/2015 T -3 hips, -1.8. DEXA 11/2016 hip -3.4. Doesn't remember to take oral bisphosphonate, unable to afford prolia. Received IV reclast 12/03/2017 - increased cramping with this, does not want to repeat injection. Would like to try fosamax again. Flu shot yearly Pneumovax 02/2006, prevnar 08/2015 Tdap 01/2011 Shingles shot - declines due to financial reasons Advanced directive discussion - Discussed. Advanced directive packet provided today. She has talked with family. Daughter RN would be HCPOA. Doesn't want prolonged life support if terminal condition.  Seat belt use discussed Sunscreen use discussed, no changing moles on skin. Non smoker Alcohol - 1-2 shots brandy regularly  Married [phone], widowed 1 son, 2 daughters, 1 son deceased 5 grandchildren Lives alone; I-  ADLs Retired, used to work [employer] Activity: some walking Diet: good water, fruits/vegetables daily  Relevant past medical, surgical, family and social history reviewed and updated as indicated. Interim medical history since our last visit reviewed. Allergies and medications reviewed and updated. Outpatient Medications Prior to Visit  Medication Sig Dispense Refill  . acetaminophen (TYLENOL) 500 MG tablet Take 1 tablet (500 mg total) by mouth every 6 (six) hours as needed. 30 tablet 0  . Calcium Carb-Cholecalciferol (CALCIUM 500 +D) 500-400 MG-UNIT TABS Take by mouth.      . Cholecalciferol (VITAMIN D3) 1000 UNITS CAPS Take 1 capsule by mouth daily.    . colchicine 0.6 MG tablet Take 1 tablet (0.6 mg total) by mouth daily as needed (foot/joint pain). 30 tablet 0  . enalapril (VASOTEC) 20 MG tablet Take 1 tablet (20 mg total) by mouth daily. 90 tablet 1  . hydrochlorothiazide (HYDRODIURIL) 25 MG tablet Take 1 tablet (25 mg total) by mouth every morning. 90 tablet 1  . Multiple Vitamins-Minerals (CENTRUM PO) Take by mouth.      . potassium chloride (K-DUR,KLOR-CON) 10 MEQ tablet TAKE 1 TABLET BY MOUTH EVERY DAY 90 tablet 0  . vitamin B-12 (CYANOCOBALAMIN) 500 MCG tablet Take 1 tablet (500 mcg total) daily by mouth. (Patient taking differently: Take 500 mcg by mouth 2 (two) times a week. Takes 1/2 tablet)    . vitamin C (ASCORBIC ACID) 500 MG tablet Take 500 mg by mouth daily.    Marland Kitchen  zoledronic acid (RECLAST) 5 MG/100ML SOLN injection Inject 100 mLs (5 mg total) into the vein as directed. Yearly at short stay     No facility-administered medications prior to visit.      Per HPI unless specifically indicated in ROS section below Review of Systems     Objective:    BP 138/78 (BP Location: Left Arm, Patient Position: Sitting, Cuff Size: Normal)   Pulse 87   Temp 98.6 F (37 C) (Oral)   Ht 5' 0.5" (1.537 m) Comment: no shoes  Wt 98 lb 8 oz (44.7 kg)   SpO2 98%   BMI 18.92 kg/m    Wt Readings from Last 3 Encounters:  01/12/18 98 lb 8 oz (44.7 kg)  01/12/18 98 lb 8 oz (44.7 kg)  11/08/17 103 lb 8 oz (46.9 kg)    Physical Exam  Constitutional: She is oriented to person, place, and time. She appears well-developed and well-nourished. No distress.  Thin   HENT:  Head: Normocephalic and atraumatic.  Right Ear: Hearing, tympanic membrane, external ear and ear canal normal.  Left Ear: Hearing, tympanic membrane, external ear and ear canal normal.  Nose: Nose normal.  Mouth/Throat: Uvula is midline, oropharynx is clear and moist and mucous membranes are normal. No oropharyngeal exudate, posterior oropharyngeal edema or posterior oropharyngeal erythema.  Eyes: Pupils are equal, round, and reactive to light. Conjunctivae and EOM are normal. No scleral icterus.  Neck: Normal range of motion. Neck supple. Carotid bruit is not present. No thyromegaly present.  Cardiovascular: Normal rate, regular rhythm, normal heart sounds and intact distal pulses.  No murmur heard. Pulses:      Radial pulses are 2+ on the right side, and 2+ on the left side.  Pulmonary/Chest: Effort normal and breath sounds normal. No respiratory distress. She has no wheezes. She has no rales.  Abdominal: Soft. Bowel sounds are normal. She exhibits no distension and no mass. There is no tenderness. There is no rebound and no guarding.  Musculoskeletal: Normal range of motion. She exhibits no edema.  L foot slightly darker than right 2+ DP bilaterally Tender to palpation at L 1st MTPJ and at dorsal lateral midfoot along cuboid  Lymphadenopathy:    She has no cervical adenopathy.  Neurological: She is alert and oriented to person, place, and time.  CN grossly intact, station and gait intact  Skin: Skin is warm and dry. No rash noted.  Psychiatric: She has a normal mood and affect. Her behavior is normal. Judgment and thought content normal.  Nursing note and vitals reviewed.  Results for orders placed  or performed in visit on 71/06/26  Basic metabolic panel  Result Value Ref Range   Sodium 135 135 - 145 mEq/L   Potassium 4.0 3.5 - 5.1 mEq/L   Chloride 98 96 - 112 mEq/L   CO2 23 19 - 32 mEq/L   Glucose, Bld 129 (H) 70 - 99 mg/dL   BUN 27 (H) 6 - 23 mg/dL   Creatinine, Ser 1.36 (H) 0.40 - 1.20 mg/dL   Calcium 10.2 8.4 - 10.5 mg/dL   GFR 48.52 (L) >60.00 mL/min   Lab Results  Component Value Date   LABURIC 7.0 11/02/2016      Assessment & Plan:   Problem List Items Addressed This Visit    Weight loss    Noted 5 lbs in last few months. Suggested sooner f/u to monitor this.       Osteoporosis    IV reclast may have  caused body aches, myalgias, cramps (Ca level was normal prior to shot). Does not want to have another infusion. Agrees to trial of oral fosamax - reviewed correct administration technique.       Relevant Medications   alendronate (FOSAMAX) 70 MG tablet   Iron deficiency anemia    Iron replacement stopped 05/2017. Pending rpt CBC off iron. Overdue for colonoscopy - see below.      Essential hypertension    Chronic, stable. Continue lisinopril and hctz. May need to come off hctz in presumed gout hx.       Dyslipidemia    Fish oil stopped 05/2017. Will need this rechecked next labs.  The 10-year ASCVD risk score Mikey Bussing DC Brooke Bonito., et al., 2013) is: 21.2%   Values used to calculate the score:     Age: 50 years     Sex: Female     Is Non-Hispanic African American: Yes     Diabetic: No     Tobacco smoker: No     Systolic Blood Pressure: 102 mmHg     Is BP treated: Yes     HDL Cholesterol: 88.4 mg/dL     Total Cholesterol: 197 mg/dL       Bilateral foot pain - Primary    Recurrent flares of pain, affecting sleep at times. ?gout related - recheck urate. Discussed colchicine use - she will try this at home. If urate returns high, will consider discontinuing hctz prior to starting daily urate lowering medicine.       Relevant Orders   Uric acid   Adenomatous  colon polyp    Overdue for repeat colonoscopy. Reviewed with patient. She will call Dr Fuller Plan to schedule.           Meds ordered this encounter  Medications  . alendronate (FOSAMAX) 70 MG tablet    Sig: Take 1 tablet (70 mg total) by mouth every 7 (seven) days. Take with a full glass of water on an empty stomach.    Dispense:  4 tablet    Refill:  11   Orders Placed This Encounter  Procedures  . Uric acid    Follow up plan: Return in about 3 months (around 04/14/2018) for follow up visit.  Ria Bush, MD

## 2018-01-12 NOTE — Assessment & Plan Note (Addendum)
Fish oil stopped 05/2017. Will need this rechecked next labs.  The 10-year ASCVD risk score Mikey Bussing DC Brooke Bonito., et al., 2013) is: 21.2%   Values used to calculate the score:     Age: 77 years     Sex: Female     Is Non-Hispanic African American: Yes     Diabetic: No     Tobacco smoker: No     Systolic Blood Pressure: 276 mmHg     Is BP treated: Yes     HDL Cholesterol: 88.4 mg/dL     Total Cholesterol: 197 mg/dL

## 2018-01-12 NOTE — Progress Notes (Signed)
Subjective:   Margaret Nguyen is a 77 y.o. female who presents for Medicare Annual (Subsequent) preventive examination.  Review of Systems:  N/A Cardiac Risk Factors include: advanced age (>72men, >22 women);dyslipidemia;hypertension     Objective:     Vitals: BP 138/78 (BP Location: Left Arm, Patient Position: Sitting, Cuff Size: Normal)   Pulse 87   Temp 98.6 F (37 C) (Oral)   Ht 5' 0.5" (1.537 m) Comment: no shoes  Wt 98 lb 8 oz (44.7 kg)   SpO2 98%   BMI 18.92 kg/m   Body mass index is 18.92 kg/m.  Advanced Directives 01/12/2018 05/03/2014 04/19/2014 04/19/2014  Does Patient Have a Medical Advance Directive? No No No Yes  Type of Advance Directive - - Public librarian  Does patient want to make changes to medical advance directive? - - - No - Patient declined  Copy of [place] in Chart? - - - No - copy requested  Would patient like information on creating a medical advance directive? No - Patient declined - - -    Tobacco Social History   Tobacco Use  Smoking Status Never Smoker  Smokeless Tobacco Never Used     Counseling given: No   Clinical Intake:  Pre-visit preparation completed: Yes  Pain : No/denies pain Pain Score: 0-No pain     Nutritional Status: BMI <19  Underweight Nutritional Risks: None Diabetes: No  How often do you need to have someone help you when you read instructions, pamphlets, or other written materials from your doctor or pharmacy?: 1 - Never What is the last grade level you completed in school?: 12th grade  Interpreter Needed?: No  Comments: pt is a widow and lives alone Information entered by :: LPinson, LPN  Past Medical History:  Diagnosis Date  . Allergy   . Arthritis   . Diverticulitis   . Family hx colonic polyps   . Heart murmur   . Hiatal hernia   . Hypertension   . Osteoporosis 08/2009   R femur -2.5  . Villous adenoma of colon    Past Surgical History:  Procedure  Laterality Date  . ABDOMINAL HYSTERECTOMY    . COLONOSCOPY  04/2010   polyps, diverticulosis, hem, rec rpt 3 yrs Fuller Plan)  . COLONOSCOPY  04/2014   mult TA, one with high grade dysplasia, mod diverticulosis, rpt 5 yrs Fuller Plan)  . DEXA  08/2009   femur -2.5  . DEXA  04/2015   T -3 hips, -1.8 spine  . LAPAROSCOPIC SIGMOID COLECTOMY     villous adenoma  . PARTIAL HYSTERECTOMY     ovaries remain  . TOE SURGERY    . TUBAL LIGATION     Family History  Problem Relation Age of Onset  . Arthritis Mother   . Arthritis Father   . Heart disease Father   . Hypertension Father   . Colon cancer Neg Hx    Social History   Socioeconomic History  . Marital status: Widowed    Spouse name: Not on file  . Number of children: Not on file  . Years of education: Not on file  . Highest education level: Not on file  Occupational History  . Not on file  Social Needs  . Financial resource strain: Not on file  . Food insecurity:    Worry: Not on file    Inability: Not on file  . Transportation needs:    Medical: Not on file  Non-medical: Not on file  Tobacco Use  . Smoking status: Never Smoker  . Smokeless tobacco: Never Used  Substance and Sexual Activity  . Alcohol use: Yes    Comment: occasional  . Drug use: Never  . Sexual activity: Not Currently  Lifestyle  . Physical activity:    Days per week: Not on file    Minutes per session: Not on file  . Stress: Not on file  Relationships  . Social connections:    Talks on phone: Not on file    Gets together: Not on file    Attends religious service: Not on file    Active member of club or organization: Not on file    Attends meetings of clubs or organizations: Not on file    Relationship status: Not on file  Other Topics Concern  . Not on file  Social History Narrative   Married [phone], widowed   1 son, 2 daughters, 1 son deceased   5 grandchildren   Lives alone; I- ADLs   Working-fulltime [employer]           Outpatient Encounter Medications as of 01/12/2018  Medication Sig  . acetaminophen (TYLENOL) 500 MG tablet Take 1 tablet (500 mg total) by mouth every 6 (six) hours as needed.  . Calcium Carb-Cholecalciferol (CALCIUM 500 +D) 500-400 MG-UNIT TABS Take by mouth.    . Cholecalciferol (VITAMIN D3) 1000 UNITS CAPS Take 1 capsule by mouth daily.  . colchicine 0.6 MG tablet Take 1 tablet (0.6 mg total) by mouth daily as needed (foot/joint pain).  . enalapril (VASOTEC) 20 MG tablet Take 1 tablet (20 mg total) by mouth daily.  . hydrochlorothiazide (HYDRODIURIL) 25 MG tablet Take 1 tablet (25 mg total) by mouth every morning.  . Multiple Vitamins-Minerals (CENTRUM PO) Take by mouth.    . potassium chloride (K-DUR,KLOR-CON) 10 MEQ tablet TAKE 1 TABLET BY MOUTH EVERY DAY  . vitamin B-12 (CYANOCOBALAMIN) 500 MCG tablet Take 1 tablet (500 mcg total) daily by mouth. (Patient taking differently: Take 500 mcg by mouth 2 (two) times a week. Takes 1/2 tablet)  . vitamin C (ASCORBIC ACID) 500 MG tablet Take 500 mg by mouth daily.  . zoledronic acid (RECLAST) 5 MG/100ML SOLN injection Inject 100 mLs (5 mg total) into the vein as directed. Yearly at short stay   No facility-administered encounter medications on file as of 01/12/2018.     Activities of Daily Living In your present state of health, do you have any difficulty performing the following activities: 01/12/2018  Hearing? Y  Vision? N  Difficulty concentrating or making decisions? N  Walking or climbing stairs? Y  Dressing or bathing? N  Doing errands, shopping? N  Preparing Food and eating ? N  Using the Toilet? N  In the past six months, have you accidently leaked urine? N  Do you have problems with loss of bowel control? N  Managing your Medications? N  Managing your Finances? N  Housekeeping or managing your Housekeeping? N  Some recent data might be hidden    Patient Care Team: Ria Bush, MD as PCP - General (Family Medicine)     Assessment:   This is a routine wellness examination for Margaret Nguyen.   Hearing Screening   125Hz  250Hz  500Hz  1000Hz  2000Hz  3000Hz  4000Hz  6000Hz  8000Hz   Right ear:   40 40 40  40    Left ear:   40 0 40  40    Vision Screening Comments: Vision exam not completed;  pt does not have corrective lens today    Exercise Activities and Dietary recommendations Current Exercise Habits: The patient does not participate in regular exercise at present, Exercise limited by: orthopedic condition(s)  Goals    . Patient Stated     Starting 01/12/2018, I will continue to take medications as prescribed.        Fall Risk Fall Risk  01/12/2018 11/09/2016 09/09/2015  Falls in the past year? No No No    Depression Screen PHQ 2/9 Scores 01/12/2018 11/09/2016 09/09/2015  PHQ - 2 Score 0 0 0  PHQ- 9 Score 0 - -     Cognitive Function MMSE - Mini Mental State Exam 01/12/2018  Orientation to time 5  Orientation to Place 5  Registration 3  Attention/ Calculation 0  Recall 2  Recall-comments unable to recall 1 of 3 words  Language- name 2 objects 0  Language- repeat 1  Language- follow 3 step command 3  Language- read & follow direction 0  Write a sentence 0  Copy design 0  Total score 19       PLEASE NOTE: A Mini-Cog screen was completed. Maximum score is 20. A value of 0 denotes this part of [place] MMSE was not completed or the patient failed this part of the Mini-Cog screening.   Mini-Cog Screening Orientation to Time - Max 5 pts Orientation to Place - Max 5 pts Registration - Max 3 pts Recall - Max 3 pts Language Repeat - Max 1 pts Language Follow 3 Step Command - Max 3 pts   Immunization History  Administered Date(s) Administered  . Influenza Split 04/19/2012  . Influenza Whole 06/13/2009  . Influenza, Seasonal, Injecte, Preservative Fre 06/12/2016  . Influenza,inj,Quad PF,6+ Mos 07/02/2014, 03/08/2015, 05/11/2017  . Influenza-Unspecified 05/13/2013  . Pneumococcal Conjugate-13 09/09/2015   . Pneumococcal Polysaccharide-23 02/24/2006  . Tdap 01/22/2011    Screening Tests Health Maintenance  Topic Date Due  . MAMMOGRAM  12/11/2018 (Originally 11/13/2017)  . COLONOSCOPY  01/13/2019 (Originally 05/03/2017)  . INFLUENZA VACCINE  02/10/2018  . DTaP/Tdap/Td (2 - Td) 01/21/2021  . TETANUS/TDAP  01/21/2021  . DEXA SCAN  Completed  . PNA vac Low Risk Adult  Completed      Plan:     I have personally reviewed, addressed, and noted the following in the patient's chart:  A. Medical and social history B. Use of alcohol, tobacco or illicit drugs  C. Current medications and supplements D. Functional ability and status E.  Nutritional status F.  Physical activity G. Advance directives H. List of other physicians I.  Hospitalizations, surgeries, and ER visits in previous 12 months J.  [place] to include hearing, vision, cognitive, depression L. Referrals and appointments - none  In addition, I have reviewed and discussed with patient certain preventive protocols, quality metrics, and best practice recommendations. A written personalized care plan for preventive services as well as general preventive health recommendations were provided to patient.  See attached scanned questionnaire for additional information.   Signed,   Lindell Noe, MHA, BS, LPN Health Coach

## 2018-01-12 NOTE — Assessment & Plan Note (Signed)
IV reclast may have caused body aches, myalgias, cramps (Ca level was normal prior to shot). Does not want to have another infusion. Agrees to trial of oral fosamax - reviewed correct administration technique.

## 2018-01-12 NOTE — Patient Instructions (Addendum)
You are due for colonoscopy - call Dr Lynne Leader office to schedule appointment.  Try fosamax in place of reclast IV. Take once a week on an empty stomach, take on an empty stomach with a large glass of water, stay upright after taking medicine.  I think foot pain is coming from gout - try colchicine and let me know it helps.  We may eventually take you off hydrochlorothiazide.  Return in 3-4 months for follow up, weight recheck.

## 2018-01-12 NOTE — Assessment & Plan Note (Addendum)
Iron replacement stopped 05/2017. Pending rpt CBC off iron. Overdue for colonoscopy - see below.

## 2018-01-12 NOTE — Patient Instructions (Signed)
Ms. Risden , Thank you for taking time to come for your Medicare Wellness Visit. I appreciate your ongoing commitment to your health goals. Please review the following plan we discussed and let me know if I can assist you in the future.   These are the goals we discussed: Goals    . Patient Stated     Starting 01/12/2018, I will continue to take medications as prescribed.        This is a list of the screening recommended for you and due dates:  Health Maintenance  Topic Date Due  . Mammogram  12/11/2018*  . Colon Cancer Screening  01/13/2019*  . Flu Shot  02/10/2018  . DTaP/Tdap/Td vaccine (2 - Td) 01/21/2021  . Tetanus Vaccine  01/21/2021  . DEXA scan (bone density measurement)  Completed  . Pneumonia vaccines  Completed  *Topic was postponed. The date shown is not the original due date.   Preventive Care for Adults  A healthy lifestyle and preventive care can promote health and wellness. Preventive health guidelines for adults include the following key practices.  . A routine yearly physical is a good way to check with your health care provider about your health and preventive screening. It is a chance to share any concerns and updates on your health and to receive a thorough exam.  . Visit your dentist for a routine exam and preventive care every 6 months. Brush your teeth twice a day and floss once a day. Good oral hygiene prevents tooth decay and gum disease.  . The frequency of eye exams is based on your age, health, family medical history, use  of contact lenses, and other factors. Follow your health care provider's recommendations for frequency of eye exams.  . Eat a healthy diet. Foods like vegetables, fruits, whole grains, low-fat dairy products, and lean protein foods contain the nutrients you need without too many calories. Decrease your intake of foods high in solid fats, added sugars, and salt. Eat the right amount of calories for you. Get information about a  proper diet from your health care provider, if necessary.  . Regular physical exercise is one of the most important things you can do for your health. Most adults should get at least 150 minutes of moderate-intensity exercise (any activity that increases your heart rate and causes you to sweat) each week. In addition, most adults need muscle-strengthening exercises on 2 or more days a week.  Silver Sneakers may be a benefit available to you. To determine eligibility, you may visit the website: www.silversneakers.com or contact program at 662-086-6901 Mon-Fri between 8AM-8PM.   . Maintain a healthy weight. The body mass index (BMI) is a screening tool to identify possible weight problems. It provides an estimate of body fat based on height and weight. Your health care provider can find your BMI and can help you achieve or maintain a healthy weight.   For adults 20 years and older: ? A BMI below 18.5 is considered underweight. ? A BMI of 18.5 to 24.9 is normal. ? A BMI of 25 to 29.9 is considered overweight. ? A BMI of 30 and above is considered obese.   . Maintain normal blood lipids and cholesterol levels by exercising and minimizing your intake of saturated fat. Eat a balanced diet with plenty of fruit and vegetables. Blood tests for lipids and cholesterol should begin at age 89 and be repeated every 5 years. If your lipid or cholesterol levels are high, you are over 50,  or you are at high risk for heart disease, you may need your cholesterol levels checked more frequently. Ongoing high lipid and cholesterol levels should be treated with medicines if diet and exercise are not working.  . If you smoke, find out from your health care provider how to quit. If you do not use tobacco, please do not start.  . If you choose to drink alcohol, please do not consume more than 2 drinks per day. One drink is considered to be 12 ounces (355 mL) of beer, 5 ounces (148 mL) of wine, or 1.5 ounces (44 mL) of  liquor.  . If you are 29-70 years old, ask your health care provider if you should take aspirin to prevent strokes.  . Use sunscreen. Apply sunscreen liberally and repeatedly throughout the day. You should seek shade when your shadow is shorter than you. Protect yourself by wearing long sleeves, pants, a wide-brimmed hat, and sunglasses year round, whenever you are outdoors.  . Once a month, do a whole body skin exam, using a mirror to look at the skin on your back. Tell your health care provider of new moles, moles that have irregular borders, moles that are larger than a pencil eraser, or moles that have changed in shape or color.

## 2018-01-20 ENCOUNTER — Other Ambulatory Visit: Payer: Self-pay | Admitting: Family Medicine

## 2018-01-20 MED ORDER — FERROUS SULFATE 325 (65 FE) MG PO TABS: 325.0000 mg | ORAL_TABLET | ORAL | 3 refills | Status: DC

## 2018-02-04 ENCOUNTER — Other Ambulatory Visit: Payer: Self-pay | Admitting: Family Medicine

## 2018-02-06 NOTE — Progress Notes (Signed)
I reviewed health advisor's note, was available for consultation, and agree with documentation and plan.  

## 2018-03-24 ENCOUNTER — Emergency Department
Admission: EM | Admit: 2018-03-24 | Discharge: 2018-03-24 | Disposition: A | Discharge status: Home / Self Care | Payer: Medicare Other | Attending: Emergency Medicine | Admitting: Emergency Medicine

## 2018-03-24 ENCOUNTER — Encounter: Payer: Self-pay | Admitting: Emergency Medicine

## 2018-03-24 ENCOUNTER — Emergency Department: Payer: Medicare Other

## 2018-03-24 ENCOUNTER — Other Ambulatory Visit: Payer: Self-pay

## 2018-03-24 DIAGNOSIS — Y999 Unspecified external cause status: Secondary | ICD-10-CM | POA: Insufficient documentation

## 2018-03-24 DIAGNOSIS — N289 Disorder of kidney and ureter, unspecified: Secondary | ICD-10-CM | POA: Insufficient documentation

## 2018-03-24 DIAGNOSIS — I959 Hypotension, unspecified: Secondary | ICD-10-CM | POA: Diagnosis not present

## 2018-03-24 DIAGNOSIS — Y9389 Activity, other specified: Secondary | ICD-10-CM | POA: Insufficient documentation

## 2018-03-24 DIAGNOSIS — S0003XA Contusion of scalp, initial encounter: Secondary | ICD-10-CM | POA: Insufficient documentation

## 2018-03-24 DIAGNOSIS — S098XXA Other specified injuries of head, initial encounter: Secondary | ICD-10-CM | POA: Diagnosis present

## 2018-03-24 DIAGNOSIS — R0789 Other chest pain: Secondary | ICD-10-CM | POA: Diagnosis not present

## 2018-03-24 DIAGNOSIS — Y92018 Other place in single-family (private) house as the place of occurrence of the external cause: Secondary | ICD-10-CM | POA: Diagnosis not present

## 2018-03-24 DIAGNOSIS — R55 Syncope and collapse: Secondary | ICD-10-CM

## 2018-03-24 DIAGNOSIS — W1839XA Other fall on same level, initial encounter: Secondary | ICD-10-CM | POA: Insufficient documentation

## 2018-03-24 DIAGNOSIS — R079 Chest pain, unspecified: Secondary | ICD-10-CM

## 2018-03-24 DIAGNOSIS — I10 Essential (primary) hypertension: Secondary | ICD-10-CM | POA: Diagnosis not present

## 2018-03-24 LAB — BASIC METABOLIC PANEL
Anion gap: 14 (ref 5–15)
BUN: 26 mg/dL — ABNORMAL HIGH (ref 8–23)
CHLORIDE: 105 mmol/L (ref 98–111)
CO2: 20 mmol/L — AB (ref 22–32)
CREATININE: 1.24 mg/dL — AB (ref 0.44–1.00)
Calcium: 8.4 mg/dL — ABNORMAL LOW (ref 8.9–10.3)
GFR calc non Af Amer: 41 mL/min — ABNORMAL LOW (ref >60)
GFR, EST AFRICAN AMERICAN: 47 mL/min — AB (ref >60)
Glucose, Bld: 98 mg/dL (ref 70–99)
POTASSIUM: 3.7 mmol/L (ref 3.5–5.1)
Sodium: 139 mmol/L (ref 135–145)

## 2018-03-24 LAB — CBC
HEMATOCRIT: 32.9 % — AB (ref 35.0–47.0)
HEMOGLOBIN: 10.7 g/dL — AB (ref 12.0–16.0)
MCH: 28.8 pg (ref 26.0–34.0)
MCHC: 32.4 g/dL (ref 32.0–36.0)
MCV: 89.1 fL (ref 80.0–100.0)
Platelets: 279 10*3/uL (ref 150–440)
RBC: 3.7 MIL/uL — AB (ref 3.80–5.20)
RDW: 13.2 % (ref 11.5–14.5)
WBC: 6.9 10*3/uL (ref 3.6–11.0)

## 2018-03-24 LAB — TROPONIN I

## 2018-03-24 LAB — FIBRIN DERIVATIVES D-DIMER (ARMC ONLY): FIBRIN DERIVATIVES D-DIMER (ARMC): 5559.41 ng{FEU}/mL — AB (ref 0.00–499.00)

## 2018-03-24 MED ORDER — SODIUM CHLORIDE 0.9 % IV BOLUS
1000.0000 mL | Freq: Once | INTRAVENOUS | Status: AC
  Administered 2018-03-24: 1000 mL via INTRAVENOUS

## 2018-03-24 MED ORDER — IOHEXOL 350 MG/ML SOLN
60.0000 mL | Freq: Once | INTRAVENOUS | Status: AC | PRN
  Administered 2018-03-24: 60 mL via INTRAVENOUS

## 2018-03-24 MED ORDER — FENTANYL CITRATE (PF) 100 MCG/2ML IJ SOLN
50.0000 ug | Freq: Once | INTRAMUSCULAR | Status: AC
  Administered 2018-03-24: 50 ug via INTRAVENOUS
  Filled 2018-03-24: qty 2

## 2018-03-24 MED ORDER — ASPIRIN 81 MG PO CHEW
324.0000 mg | CHEWABLE_TABLET | Freq: Once | ORAL | Status: AC
  Administered 2018-03-24: 324 mg via ORAL
  Filled 2018-03-24: qty 4

## 2018-03-24 NOTE — ED Triage Notes (Signed)
Patient from home via [facility]. Per EMS, patient was outside in the heat sweeping when she had syncopal episode and hit head on ground. Patient hypotensive with EMS, given 500 ml NS en route. CBG 133. Patient reports poor oral intake today. Patient alert and oriented upon arrival. Reports pain back of head with small hematoma, and pain under left breast.

## 2018-03-24 NOTE — ED Notes (Signed)

## 2018-03-24 NOTE — Discharge Instructions (Addendum)
Please drink plenty of fluid to stay well-hydrated and have your primary care physician recheck your kidney function in 1 to 2 days.  Return to the emergency department if you develop severe pain, lightheadedness or fainting, chest pain, palpitations, fever, or any other symptoms concerning to you.

## 2018-03-24 NOTE — ED Provider Notes (Addendum)
[facility] Emergency Department Provider Note  ____________________________________________  Time seen: Approximately 4:34 PM  I have reviewed the triage vital signs and the nursing notes.   HISTORY  Chief Complaint Loss of Consciousness    HPI Margaret Nguyen is a 77 y.o. female w/ a hx of hypertension presenting for syncope.  The pt reports that she was sweeping the porch in the heat, came inside and then had a syncopal episode without any preceding symptoms.  This syncopal episode was brief and there was a report that she was slightly confused for some time afterwards.  The patient does report that she struck the back of her head and has a small contusion on the posterior scalp.  She is also complaining of a chest pain under the left breast is worse with deep breaths.  EMS noted the patient's blood pressure to be 80s over 60s, this resolved with 500 cc of fluid.  She had a normal blood sugar.  Past Medical History:  Diagnosis Date  . Allergy   . Arthritis   . Diverticulitis   . Family hx colonic polyps   . Heart murmur   . Hiatal hernia   . Hypertension   . Osteoporosis 08/2009   R femur -2.5  . Villous adenoma of colon     Patient Active Problem List   Diagnosis Date Noted  . Weight loss 01/12/2018  . Bilateral foot pain 11/08/2017  . Iron deficiency anemia 11/09/2016  . Vitamin B12 deficiency 11/09/2016  . Dyslipidemia 11/02/2016  . Medicare annual wellness visit, subsequent 09/09/2015  . Advanced care planning/counseling discussion 09/09/2015  . Diverticulosis of colon without hemorrhage 09/09/2015  . Right sided sciatica 01/16/2014  . Osteoporosis 08/13/2009  . ALOPECIA 02/28/2008  . Essential hypertension 05/09/2007  . DEGENERATIVE JOINT DISEASE, LEFT HIP 05/09/2007  . Adenomatous colon polyp 05/09/2007    Past Surgical History:  Procedure Laterality Date  . ABDOMINAL HYSTERECTOMY    . COLONOSCOPY  04/2010   polyps,  diverticulosis, hem, rec rpt 3 yrs [facility] Plan)  . COLONOSCOPY  04/2014   mult TA, one with high grade dysplasia, mod diverticulosis, rpt 5 yrs [facility] Plan)  . DEXA  08/2009   femur -2.5  . DEXA  04/2015   T -3 hips, -1.8 spine  . LAPAROSCOPIC SIGMOID COLECTOMY     villous adenoma  . PARTIAL HYSTERECTOMY     ovaries remain  . TOE SURGERY    . TUBAL LIGATION      Current Outpatient Rx  . Order #: [id number] Class: OTC  . Order #: [id number] Class: Normal  . Order #: [id number] Class: Historical Med  . Order #: [id number] Class: Historical Med  . Order #: [id number] Class: Normal  . Order #: [id number] Class: Normal  . Order #: [id number] Class: OTC  . Order #: [id number] Class: Normal  . Order #: [id number] Class: Historical Med  . Order #: [id number] Class: Normal  . Order #: [id number] Class: OTC  . Order #: [id number] Class: Historical Med  . Order #: [id number] Class: Historical Med    Allergies Other  Family History  Problem Relation Age of Onset  . Arthritis Mother   . Arthritis Father   . Heart disease Father   . Hypertension Father   . Colon cancer Neg Hx     Social History Social History   Tobacco Use  . Smoking status: Never Smoker  . Smokeless tobacco: Never Used  Substance Use Topics  . Alcohol use: Yes    Comment: occasional  . Drug use: Never  Review of Systems Constitutional: No fever/chills.  Positive syncope.  Positive loss of consciousness.  Positive fall with trauma. Eyes: No visual changes.  No blurred or double vision. ENT: No sore throat. No congestion or rhinorrhea. Cardiovascular: Positive pleuritic left-sided chest pain. Denies palpitations. Respiratory: Denies shortness of breath.  No cough. Gastrointestinal: No abdominal pain.  No nausea, no vomiting.  No diarrhea.  No constipation. Genitourinary: Negative for dysuria.  No urinary frequency peer Musculoskeletal: Negative for back pain.  No neck pain.  No extremity injury. Skin: Negative for rash. Neurological:  Negative for headaches. No focal numbness, tingling or weakness.  No difficulty walking.  No changes in vision, speech or mental status.    ____________________________________________   PHYSICAL EXAM:  VITAL SIGNS: ED Triage Vitals  Enc Vitals Group     BP 03/24/18 1629 136/78     Pulse Rate 03/24/18 1629 85     Resp 03/24/18 1629 18     Temp 03/24/18 1633 98.2 F (36.8 C)     Temp Source 03/24/18 1629 Oral     SpO2 03/24/18 1629 100 %     Weight --      Height --      Head Circumference --      Peak Flow --      Pain Score --      Pain Loc --      Pain Edu? --      Excl. in [place]? --     Constitutional: Alert and oriented. Answers questions appropriately.  GCS is 15. Eyes: Conjunctivae are normal.  EOMI. No scleral icterus.  No raccoon eyes or battle sign.   Head: 2 x 2 centimeter contusion to the posterior left side of the scalp without any overlying abrasion or laceration. Nose: No congestion/rhinnorhea.  No swelling over the nose or septal hematoma. Mouth/Throat: Mucous membranes are moist.  No dental injury or malocclusion. Neck: No stridor.  Supple.  No JVD.  No meningismus.  No midline C-spine tenderness to palpation, step-offs or deformities. Cardiovascular: Normal rate, regular rhythm. No murmurs, rubs or gallops.  The chest does not have any overlying bruising, palpable instability of the ribs or crepitus. Respiratory: Normal respiratory effort.  No accessory muscle use or retractions. Lungs CTAB.  No wheezes, rales or ronchi. Gastrointestinal: Soft, nontender and nondistended.  No guarding or rebound.  No peritoneal signs. Musculoskeletal: No LE edema. No ttp in the calves or palpable cords.  Negative Homan's sign.  Pelvis is stable.  No obvious extremity injuries. Neurologic:  A&Ox3.  Speech is clear.  Face and smile are symmetric.  EOMI. PERRLA.  Moves all extremities well. Skin:  Skin is warm, dry and intact. No rash noted. Psychiatric: Mood and affect are normal.  Speech and behavior are normal.  Normal judgement.  ____________________________________________   LABS (all labs ordered are listed, but only abnormal results are displayed)  Labs Reviewed  FIBRIN DERIVATIVES D-DIMER ([facility] ONLY) - Abnormal; Notable for the following components:      Result Value   Fibrin derivatives D-dimer [facility]) 5,559.41 (*)    All other components within normal limits  CBC - Abnormal; Notable for the following components:   RBC 3.70 (*)    Hemoglobin 10.7 (*)    HCT 32.9 (*)    All other components within normal limits  BASIC METABOLIC PANEL - Abnormal; Notable for the following components:   CO2 20 (*)    BUN 26 (*)    Creatinine, Ser 1.24 (*)  Calcium 8.4 (*)    GFR calc non Af Amer 41 (*)    GFR calc Af Amer 47 (*)    All other components within normal limits  TROPONIN I   ____________________________________________  EKG  ED ECG REPORT I, Anne-Caroline Mariea Clonts, the attending physician, personally viewed and interpreted this ECG.   Date: 03/24/2018  EKG Time: 1638  Rate: 82  Rhythm: normal sinus rhythm  Axis: normal  Intervals:none  ST&T Change: Nonspecific T wave inversions in V1 through V3.  No STEMI.  This EKG is compared to prior in 2012 which also shows T wave inversions in V1 and V2.  ____________________________________________  RADIOLOGY  Ct Head Wo Contrast  Result Date: 03/24/2018 CLINICAL DATA:  Ataxia, post head trauma. EXAM: CT HEAD WITHOUT CONTRAST TECHNIQUE: Contiguous axial images were obtained from the base of the skull through the vertex without intravenous contrast. COMPARISON:  None. FINDINGS: Brain: Mild chronic small vessel ischemic changes within the periventricular white matter. No mass, hemorrhage, edema or other evidence of acute parenchymal abnormality. No extra-axial hemorrhage. Vascular: Chronic calcified atherosclerotic changes of the large vessels at the skull base. No unexpected hyperdense vessel. Skull:  Normal. Negative for fracture or focal lesion. Sinuses/Orbits: No acute finding. Other: None. IMPRESSION: 1. No acute findings. No intracranial mass, hemorrhage or edema. No skull fracture. 2. Mild chronic small vessel ischemic change in the white matter. Electronically Signed   By: Franki Cabot M.D.   On: 03/24/2018 17:08   Ct Angio Chest Pe W And/or Wo Contrast  Result Date: 03/24/2018 CLINICAL DATA:  Patient with syncopal episode. Left-sided chest pain. EXAM: CT ANGIOGRAPHY CHEST WITH CONTRAST TECHNIQUE: Multidetector CT imaging of the chest was performed using the standard protocol during bolus administration of intravenous contrast. Multiplanar CT image reconstructions and MIPs were obtained to evaluate the vascular anatomy. CONTRAST:  38mL OMNIPAQUE IOHEXOL 350 MG/ML SOLN COMPARISON:  CT chest 04/26/2007 FINDINGS: Cardiovascular: Heart is mildly enlarged. Main pulmonary artery mildly dilated measuring 3.1 cm. Ascending thoracic aorta measures 3.5 cm. Thoracic aortic vascular calcifications. Adequate opacification of the pulmonary artery. No filling defects identified to suggest acute pulmonary embolus. Mediastinum/Nodes: No enlarged axillary, mediastinal or hilar lymphadenopathy. Small hiatal hernia. Lungs/Pleura: Central airways are patent. Dependent atelectasis within the bilateral lower lobes. No large area of pulmonary consolidation. No pleural effusion or pneumothorax. Upper Abdomen: Liver is diffusely low in attenuation compatible with steatosis. Musculoskeletal: Thoracic spine degenerative changes. No aggressive or acute appearing osseous lesions. Review of the MIP images confirms the above findings. IMPRESSION: No evidence for acute pulmonary embolus. No acute process in the chest. Electronically Signed   By: Lovey Newcomer M.D.   On: 03/24/2018 19:14   Dg Chest Portable 1 View  Result Date: 03/24/2018 CLINICAL DATA:  Syncope EXAM: PORTABLE CHEST 1 VIEW COMPARISON:  CT chest 04/26/2007 FINDINGS:  There is no focal parenchymal opacity. There is no pleural effusion or pneumothorax. The heart and mediastinal contours are unremarkable. The osseous structures are unremarkable. IMPRESSION: No active disease. Electronically Signed   By: Kathreen Devoid   On: 03/24/2018 16:52    ____________________________________________   PROCEDURES  Procedure(s) performed: None  Procedures  Critical Care performed: No ____________________________________________   INITIAL IMPRESSION / ASSESSMENT AND PLAN / ED COURSE  Pertinent labs & imaging results that were available during my care of the patient were reviewed by me and considered in my medical decision making (see chart for details).  77 y.o. female with a history of hypertension presenting for  syncopal episode after being outside in the heat, with initial hypotension.  The most likely etiology of the patient's symptoms is hypovolemia or dehydration, possible vagal episode.  However, we will also evaluate her for arrhythmia; I do not see any abnormal rhythm on her EKG and there is no evidence of ischemia; we will continue to keep her on the cardiac monitor.  The patient is having a chest pain, which may be traumatic or may be a sign of a cause for her syncope; a d-dimer will be sent.  The patient will undergo chest x-ray to evaluate for any rib fractures, pneumothorax, or abnormal cardiopulmonary findings.  Laboratory studies, including UA, are pending the patient will be treated for pain.  Plan reevaluation for final disposition.  ----------------------------------------- 6:01 PM on 03/24/2018 -----------------------------------------  The patient's trauma evaluation is reassuring with no evidence of rib fractures or abnormality intracranially and her CT head.  She does have a positive d-dimer and a CT chest for evaluation of PE has been ordered.  Her anemia is baseline and unchanged.  She has a mild bump in her creatinine, which may be indicative of  hypovolemia but has received intravenous fluids while here.  Her orthostatic vital signs were reassuring.  Her troponin was also negative.  Plan reevaluation for final disposition.  ----------------------------------------- 7:26 PM on 03/24/2018 -----------------------------------------  The patient CT scan does not show any evidence of PE, nor does she have any bony fractures in the ribs.  At this time, the patient is feeling significantly better and I will plan to discharge her home.  She understands return precautions as well as follow-up instructions.  She has been instructed to drink plenty of fluid and have her creatinine checked by her primary care physician. ____________________________________________  FINAL CLINICAL IMPRESSION(S) / ED DIAGNOSES  Final diagnoses:  Syncope, unspecified syncope type  Hypotension, unspecified hypotension type  Contusion of scalp, initial encounter  Renal insufficiency  Left-sided chest pain         NEW MEDICATIONS STARTED DURING THIS VISIT:  New Prescriptions   No medications on file      Eula Listen, MD 03/24/18 1647    Eula Listen, MD 03/24/18 Johnnye Lana    Eula Listen, MD 03/24/18 1927

## 2018-03-30 ENCOUNTER — Ambulatory Visit (INDEPENDENT_AMBULATORY_CARE_PROVIDER_SITE_OTHER): Payer: Medicare Other | Admitting: Family Medicine

## 2018-03-30 ENCOUNTER — Encounter: Payer: Self-pay | Admitting: Family Medicine

## 2018-03-30 VITALS — BP 150/70 | HR 79 | Temp 98.2°F | Ht 60.5 in | Wt 99.5 lb

## 2018-03-30 DIAGNOSIS — D509 Iron deficiency anemia, unspecified: Secondary | ICD-10-CM

## 2018-03-30 DIAGNOSIS — M810 Age-related osteoporosis without current pathological fracture: Secondary | ICD-10-CM | POA: Diagnosis not present

## 2018-03-30 DIAGNOSIS — R51 Headache: Secondary | ICD-10-CM

## 2018-03-30 DIAGNOSIS — R55 Syncope and collapse: Secondary | ICD-10-CM | POA: Diagnosis not present

## 2018-03-30 DIAGNOSIS — I10 Essential (primary) hypertension: Secondary | ICD-10-CM

## 2018-03-30 DIAGNOSIS — Z23 Encounter for immunization: Secondary | ICD-10-CM | POA: Diagnosis not present

## 2018-03-30 DIAGNOSIS — K449 Diaphragmatic hernia without obstruction or gangrene: Secondary | ICD-10-CM

## 2018-03-30 DIAGNOSIS — R519 Headache, unspecified: Secondary | ICD-10-CM

## 2018-03-30 NOTE — Patient Instructions (Addendum)
Flu shot today.  Make sure to stay well hydrated - small sips of fluids each day.  Let me know if ongoing right eye pain and headache. Warm compresses to R face.  Try fosamax - watch for GI upset or heartburn symptoms.

## 2018-03-30 NOTE — Progress Notes (Signed)
BP (!) 150/70 (BP Location: Right Arm, Patient Position: Sitting, Cuff Size: Normal)   Pulse 79   Temp 98.2 F (36.8 C) (Oral)   Ht 5' 0.5" (1.537 m)   Wt 99 lb 8 oz (45.1 kg)   SpO2 97%   BMI 19.11 kg/m   Orthostatic VS for the past 24 hrs (Last 3 readings):  BP- Lying BP- Standing at 0 minutes  03/30/18 1520 - 148/76  03/30/18 1518 152/76 -    CC: hosp f/u visit Subjective:    Patient ID: Margaret Nguyen, female    DOB: [birth year], 77 y.o.   MRN: [id number]  HPI: Margaret Nguyen is a 77 y.o. female presenting on 03/30/2018 for Hospitalization Follow-up (Pt provided d/c summary from ER visit on 03/24/18. Pt accompanied by her daughter, Venida Jarvis.)   Here with daughter Venida Jarvis who is an Therapist, sports.   Recent ER evaluation 03/24/2018, records reviewed. Seen for episode of syncope while sweeping porch in the heat. She did injure back of her head with noted small contusion posterior scalp. BP by EMS was 80/60s, improved with 541mL IVF. cbg was normal. Head CT without acute process. D dimer was elevated, CTA chest was negative for PE - it did show mildly enlarged heart, mildly dilated main PA, small HH, hepatic steatosis. Hgb 10.7, Cr 1.24. No arrhythmia on cardiac monitoring.   Daughter mentions she had not had anything to eat or likely drink that morning prior to passing out.   She also notices some discomfort ache and swelling around R eye. No vision changes.   Relevant past medical, surgical, family and social history reviewed and updated as indicated. Interim medical history since our last visit reviewed. Allergies and medications reviewed and updated. Outpatient Medications Prior to Visit  Medication Sig Dispense Refill  . acetaminophen (TYLENOL) 500 MG tablet Take 1 tablet (500 mg total) by mouth every 6 (six) hours as needed. 30 tablet 0  . Calcium Carb-Cholecalciferol (CALCIUM 500 +D) 500-400 MG-UNIT TABS Take by mouth.      . Cholecalciferol (VITAMIN D3) 1000 UNITS CAPS Take  1 capsule by mouth daily.    . colchicine 0.6 MG tablet Take 1 tablet (0.6 mg total) by mouth daily as needed (foot/joint pain). 30 tablet 0  . enalapril (VASOTEC) 20 MG tablet Take 1 tablet (20 mg total) by mouth daily. 90 tablet 1  . ferrous sulfate 325 (65 FE) MG tablet Take 1 tablet (325 mg total) by mouth every Monday, Wednesday, and Friday.  3  . hydrochlorothiazide (HYDRODIURIL) 25 MG tablet Take 1 tablet (25 mg total) by mouth every morning. 90 tablet 1  . Multiple Vitamins-Minerals (CENTRUM PO) Take by mouth.      . potassium chloride (K-DUR,KLOR-CON) 10 MEQ tablet TAKE 1 TABLET BY MOUTH EVERY DAY 90 tablet 1  . vitamin B-12 (CYANOCOBALAMIN) 500 MCG tablet Take 1 tablet (500 mcg total) daily by mouth. (Patient taking differently: Take 500 mcg by mouth 2 (two) times a week. Takes 1/2 tablet)    . vitamin C (ASCORBIC ACID) 500 MG tablet Take 500 mg by mouth daily.    . zoledronic acid (RECLAST) 5 MG/100ML SOLN injection Inject 100 mLs (5 mg total) into the vein as directed. Yearly at short stay    . alendronate (FOSAMAX) 70 MG tablet Take 1 tablet (70 mg total) by mouth every 7 (seven) days. Take with a full glass of water on an empty stomach. 4 tablet 11   No facility-administered medications  prior to visit.      Per HPI unless specifically indicated in ROS section below Review of Systems     Objective:    BP (!) 150/70 (BP Location: Right Arm, Patient Position: Sitting, Cuff Size: Normal)   Pulse 79   Temp 98.2 F (36.8 C) (Oral)   Ht 5' 0.5" (1.537 m)   Wt 99 lb 8 oz (45.1 kg)   SpO2 97%   BMI 19.11 kg/m   Wt Readings from Last 3 Encounters:  03/30/18 99 lb 8 oz (45.1 kg)  03/24/18 102 lb (46.3 kg)  01/12/18 98 lb 8 oz (44.7 kg)    Physical Exam  Constitutional: She appears well-developed and well-nourished. No distress.  HENT:  Head: Normocephalic and atraumatic.  Mouth/Throat: Oropharynx is clear and moist. No oropharyngeal exudate.  Mild swelling below R eye  without point tenderness or tenderness to palpation at temple Small tender bump at occiput  Eyes: Pupils are equal, round, and reactive to light. Conjunctivae and EOM are normal.  EOMI without pain  Neck: Normal range of motion. Neck supple. No thyromegaly present.  Cardiovascular: Normal rate, regular rhythm and normal heart sounds.  No murmur heard. Pulmonary/Chest: Effort normal and breath sounds normal. No respiratory distress. She has no wheezes. She has no rales.  Musculoskeletal: She exhibits no edema.  Skin: Skin is warm. Capillary refill takes less than 2 seconds. No rash noted.  Psychiatric: She has a normal mood and affect.  Nursing note and vitals reviewed.  Results for orders placed or performed during the hospital encounter of 03/24/18  Fibrin derivatives D-Dimer [facility] only)  Result Value Ref Range   Fibrin derivatives D-dimer ([facility]) 5,559.41 (H) 0.00 - 499.00 ng/mL (FEU)  CBC  Result Value Ref Range   WBC 6.9 3.6 - 11.0 K/uL   RBC 3.70 (L) 3.80 - 5.20 MIL/uL   Hemoglobin 10.7 (L) 12.0 - 16.0 g/dL   HCT 32.9 (L) 35.0 - 47.0 %   MCV 89.1 80.0 - 100.0 fL   MCH 28.8 26.0 - 34.0 pg   MCHC 32.4 32.0 - 36.0 g/dL   RDW 13.2 11.5 - 14.5 %   Platelets 279 150 - 440 K/uL  Basic metabolic panel  Result Value Ref Range   Sodium 139 135 - 145 mmol/L   Potassium 3.7 3.5 - 5.1 mmol/L   Chloride 105 98 - 111 mmol/L   CO2 20 (L) 22 - 32 mmol/L   Glucose, Bld 98 70 - 99 mg/dL   BUN 26 (H) 8 - 23 mg/dL   Creatinine, Ser 1.24 (H) 0.44 - 1.00 mg/dL   Calcium 8.4 (L) 8.9 - 10.3 mg/dL   GFR calc non Af Amer 41 (L) >60 mL/min   GFR calc Af Amer 47 (L) >60 mL/min   Anion gap 14 5 - 15  Troponin I  Result Value Ref Range   Troponin I <0.03 <0.03 ng/mL   Lab Results  Component Value Date   TSH 1.70 11/02/2016       Assessment & Plan:   Problem List Items Addressed This Visit    Syncope - Primary    Agree episode most consistent with vasovagal syncope after sweeping porch in  the heat, and poor PO intake that morning. Reviewed importance of good hydration status as well as discussed risks of dehydration with her current antihypertensive regimen.       Right sided facial pain    Overall benign exam today. Anticipate related to recent fall. Will  monitor for now, update [place] if pain develops at temple associated with vision changes.       Osteoporosis    prolia unaffordable. She has not started fosamax - encouraged she try this. Reviewed correct administration for this medication.       Iron deficiency anemia    She continues oral iron tablet OTC MWF.  Overdue for colonoscopy - encouraged she call GI to set this up. Daughter also aware.      Hiatal hernia   Essential hypertension    Reviewed importance of good hydration status on this med to avoid ARF. BP mildly elevated today despite meds - in setting of recent hypotension and syncope, no med changes made today.        Other Visit Diagnoses    Need for influenza vaccination       Relevant Orders   Flu Vaccine QUAD 36+ mos IM (Completed)       No orders of the defined types were placed in this encounter.  Orders Placed This Encounter  Procedures  . Flu Vaccine QUAD 36+ mos IM    Follow up plan: No follow-ups on file.  Ria Bush, MD

## 2018-03-31 ENCOUNTER — Encounter: Payer: Self-pay | Admitting: Family Medicine

## 2018-03-31 DIAGNOSIS — R51 Headache: Secondary | ICD-10-CM

## 2018-03-31 DIAGNOSIS — K449 Diaphragmatic hernia without obstruction or gangrene: Secondary | ICD-10-CM | POA: Insufficient documentation

## 2018-03-31 DIAGNOSIS — R55 Syncope and collapse: Secondary | ICD-10-CM | POA: Insufficient documentation

## 2018-03-31 DIAGNOSIS — R519 Headache, unspecified: Secondary | ICD-10-CM | POA: Insufficient documentation

## 2018-03-31 NOTE — Assessment & Plan Note (Signed)
prolia unaffordable. She has not started fosamax - encouraged she try this. Reviewed correct administration for this medication.

## 2018-03-31 NOTE — Assessment & Plan Note (Addendum)
Agree episode most consistent with vasovagal syncope after sweeping porch in the heat, and poor PO intake that morning. Reviewed importance of good hydration status as well as discussed risks of dehydration with her current antihypertensive regimen.

## 2018-03-31 NOTE — Assessment & Plan Note (Signed)
Reviewed importance of good hydration status on this med to avoid ARF. BP mildly elevated today despite meds - in setting of recent hypotension and syncope, no med changes made today.

## 2018-03-31 NOTE — Assessment & Plan Note (Addendum)
She continues oral iron tablet OTC MWF.  Overdue for colonoscopy - encouraged she call GI to set this up. Daughter also aware.

## 2018-03-31 NOTE — Assessment & Plan Note (Signed)
Overall benign exam today. Anticipate related to recent fall. Will monitor for now, update [place] if pain develops at temple associated with vision changes.

## 2018-04-14 ENCOUNTER — Ambulatory Visit (INDEPENDENT_AMBULATORY_CARE_PROVIDER_SITE_OTHER): Payer: Medicare Other | Admitting: Family Medicine

## 2018-04-14 ENCOUNTER — Encounter: Payer: Self-pay | Admitting: Family Medicine

## 2018-04-14 VITALS — BP 136/70 | HR 74 | Temp 98.3°F | Ht 60.5 in | Wt 99.0 lb

## 2018-04-14 DIAGNOSIS — N289 Disorder of kidney and ureter, unspecified: Secondary | ICD-10-CM | POA: Diagnosis not present

## 2018-04-14 DIAGNOSIS — M810 Age-related osteoporosis without current pathological fracture: Secondary | ICD-10-CM | POA: Diagnosis not present

## 2018-04-14 DIAGNOSIS — R634 Abnormal weight loss: Secondary | ICD-10-CM | POA: Diagnosis not present

## 2018-04-14 DIAGNOSIS — N183 Chronic kidney disease, stage 3 unspecified: Secondary | ICD-10-CM | POA: Insufficient documentation

## 2018-04-14 DIAGNOSIS — R55 Syncope and collapse: Secondary | ICD-10-CM

## 2018-04-14 DIAGNOSIS — I10 Essential (primary) hypertension: Secondary | ICD-10-CM | POA: Diagnosis not present

## 2018-04-14 DIAGNOSIS — D126 Benign neoplasm of colon, unspecified: Secondary | ICD-10-CM

## 2018-04-14 DIAGNOSIS — D509 Iron deficiency anemia, unspecified: Secondary | ICD-10-CM

## 2018-04-14 LAB — RENAL FUNCTION PANEL
ALBUMIN: 4.2 g/dL (ref 3.5–5.2)
BUN: 22 mg/dL (ref 6–23)
CALCIUM: 9.6 mg/dL (ref 8.4–10.5)
CHLORIDE: 103 meq/L (ref 96–112)
CO2: 28 meq/L (ref 19–32)
Creatinine, Ser: 1.21 mg/dL — ABNORMAL HIGH (ref 0.40–1.20)
GFR: 55.47 mL/min — ABNORMAL LOW (ref >60.00)
Glucose, Bld: 111 mg/dL — ABNORMAL HIGH (ref 70–99)
POTASSIUM: 3.9 meq/L (ref 3.5–5.1)
Phosphorus: 3.5 mg/dL (ref 2.3–4.6)
SODIUM: 138 meq/L (ref 135–145)

## 2018-04-14 LAB — T4, FREE: Free T4: 0.64 ng/dL (ref 0.60–1.60)

## 2018-04-14 LAB — TSH: TSH: 4.24 u[IU]/mL (ref 0.35–4.50)

## 2018-04-14 MED ORDER — ALENDRONATE SODIUM 70 MG PO TABS: 70.0000 mg | ORAL_TABLET | ORAL | 11 refills | Status: DC

## 2018-04-14 NOTE — Assessment & Plan Note (Signed)
Chronic, stable. Continue current regimen. 

## 2018-04-14 NOTE — Assessment & Plan Note (Signed)
Has been taking fosamax weekly for last few weeks, tolerating well. Check Cr with starting bisphosphonate.

## 2018-04-14 NOTE — Assessment & Plan Note (Signed)
Check renal panel today. 

## 2018-04-14 NOTE — Patient Instructions (Signed)
Call for colonoscopy.  Blood pressures are good. Labs today.  Return as needed or in 6 months for follow up visit

## 2018-04-14 NOTE — Assessment & Plan Note (Signed)
Iron levels repleted. Continue iron OTC MWF.  Encouraged GI f/u - due for colonoscopy.

## 2018-04-14 NOTE — Progress Notes (Signed)
BP 136/70 (BP Location: Left Arm, Patient Position: Sitting, Cuff Size: Normal)   Pulse 74   Temp 98.3 F (36.8 C) (Oral)   Ht 5' 0.5" (1.537 m)   Wt 99 lb (44.9 kg)   SpO2 100%   BMI 19.02 kg/m    CC: 73mo f/u visit Subjective:    Patient ID: Margaret Nguyen, female    DOB: Sep 15, 1940, 77 y.o.   MRN: [id number]  HPI: Margaret Nguyen is a 77 y.o. female presenting on 04/14/2018 for 3-4 mo follow up   Recent weight loss over the past year - brought in sooner for recheck of this - weight stable at 99lbs.   No further syncopal spells. Has not called to schedule colonoscopy (in h/o IDA).  Osteoporosis - reclast caused worsening myalgias so we started fosamax - she has started this and is tolerating well - takes on Sundays. prolia unaffordable.   Relevant past medical, surgical, family and social history reviewed and updated as indicated. Interim medical history since our last visit reviewed. Allergies and medications reviewed and updated. Outpatient Medications Prior to Visit  Medication Sig Dispense Refill  . acetaminophen (TYLENOL) 500 MG tablet Take 1 tablet (500 mg total) by mouth every 6 (six) hours as needed. 30 tablet 0  . Calcium Carb-Cholecalciferol (CALCIUM 500 +D) 500-400 MG-UNIT TABS Take by mouth.      . Cholecalciferol (VITAMIN D3) 1000 UNITS CAPS Take 1 capsule by mouth daily.    . colchicine 0.6 MG tablet Take 1 tablet (0.6 mg total) by mouth daily as needed (foot/joint pain). 30 tablet 0  . enalapril (VASOTEC) 20 MG tablet Take 1 tablet (20 mg total) by mouth daily. 90 tablet 1  . ferrous sulfate 325 (65 FE) MG tablet Take 1 tablet (325 mg total) by mouth every Monday, Wednesday, and Friday.  3  . hydrochlorothiazide (HYDRODIURIL) 25 MG tablet Take 1 tablet (25 mg total) by mouth every morning. 90 tablet 1  . Multiple Vitamins-Minerals (CENTRUM PO) Take by mouth.      . potassium chloride (K-DUR,KLOR-CON) 10 MEQ tablet TAKE 1 TABLET BY MOUTH EVERY DAY  90 tablet 1  . vitamin B-12 (CYANOCOBALAMIN) 500 MCG tablet Take 1 tablet (500 mcg total) daily by mouth. (Patient taking differently: Take 500 mcg by mouth 2 (two) times a week. Takes 1/2 tablet)    . vitamin C (ASCORBIC ACID) 500 MG tablet Take 500 mg by mouth daily.     No facility-administered medications prior to visit.      Per HPI unless specifically indicated in ROS section below Review of Systems     Objective:    BP 136/70 (BP Location: Left Arm, Patient Position: Sitting, Cuff Size: Normal)   Pulse 74   Temp 98.3 F (36.8 C) (Oral)   Ht 5' 0.5" (1.537 m)   Wt 99 lb (44.9 kg)   SpO2 100%   BMI 19.02 kg/m   Wt Readings from Last 3 Encounters:  04/14/18 99 lb (44.9 kg)  03/30/18 99 lb 8 oz (45.1 kg)  03/24/18 102 lb (46.3 kg)    Physical Exam  Constitutional: She appears well-developed and well-nourished. No distress.  HENT:  Mouth/Throat: Oropharynx is clear and moist. No oropharyngeal exudate.  Cardiovascular: Normal rate and regular rhythm.  Murmur (mild systolic) heard. Pulmonary/Chest: Effort normal and breath sounds normal. No respiratory distress. She has no wheezes. She has no rales.  Musculoskeletal: She exhibits no edema.  Psychiatric: She has a normal  mood and affect.  Nursing note and vitals reviewed.    Lab Results  Component Value Date   CREATININE 1.24 (H) 03/24/2018   BUN 26 (H) 03/24/2018   NA 139 03/24/2018   K 3.7 03/24/2018   CL 105 03/24/2018   CO2 20 (L) 03/24/2018   Lab Results  Component Value Date   TSH 1.70 11/02/2016       Assessment & Plan:   Problem List Items Addressed This Visit    Weight loss    Weight has stabilized. Reports good appetite. Attributes low weight to rapid metabolism. Check TFTs today.      Relevant Orders   T4, free   RESOLVED: Syncope    No further episodes.       Renal insufficiency    Check renal panel today.       Osteoporosis    Has been taking fosamax weekly for last few weeks,  tolerating well. Check Cr with starting bisphosphonate.       Relevant Medications   alendronate (FOSAMAX) 70 MG tablet   Other Relevant Orders   TSH   Iron deficiency anemia    Iron levels repleted. Continue iron OTC MWF.  Encouraged GI f/u - due for colonoscopy.       Essential hypertension - Primary    Chronic, stable. Continue current regimen.       Relevant Orders   TSH   T4, free   Renal function panel   Adenomatous colon polyp       Meds ordered this encounter  Medications  . alendronate (FOSAMAX) 70 MG tablet    Sig: Take 1 tablet (70 mg total) by mouth every 7 (seven) days. Take with a full glass of water on an empty stomach.    Dispense:  4 tablet    Refill:  11   Orders Placed This Encounter  Procedures  . TSH  . T4, free  . Renal function panel    Follow up plan: Return in about 6 months (around 10/14/2018) for follow up visit.  Ria Bush, MD

## 2018-04-14 NOTE — Assessment & Plan Note (Signed)
Weight has stabilized. Reports good appetite. Attributes low weight to rapid metabolism. Check TFTs today.

## 2018-04-14 NOTE — Assessment & Plan Note (Signed)
No further episodes

## 2018-05-04 ENCOUNTER — Other Ambulatory Visit: Payer: Self-pay | Admitting: Family Medicine

## 2018-05-19 ENCOUNTER — Other Ambulatory Visit: Payer: Self-pay | Admitting: *Deleted

## 2018-05-19 MED ORDER — HYDROCHLOROTHIAZIDE 25 MG PO TABS: 25.0000 mg | ORAL_TABLET | Freq: Every morning | ORAL | 0 refills | Status: DC

## 2018-08-03 ENCOUNTER — Other Ambulatory Visit: Payer: Self-pay | Admitting: Family Medicine

## 2018-11-16 ENCOUNTER — Other Ambulatory Visit: Payer: Self-pay | Admitting: Family Medicine

## 2019-01-27 ENCOUNTER — Ambulatory Visit: Payer: Medicare Other

## 2019-01-27 ENCOUNTER — Encounter: Payer: Self-pay | Admitting: Family Medicine

## 2019-01-27 ENCOUNTER — Other Ambulatory Visit: Payer: Self-pay

## 2019-01-27 ENCOUNTER — Ambulatory Visit (INDEPENDENT_AMBULATORY_CARE_PROVIDER_SITE_OTHER): Payer: Medicare Other | Admitting: Family Medicine

## 2019-01-27 VITALS — BP 118/66 | HR 89 | Temp 97.6°F | Ht 60.05 in | Wt 105.1 lb

## 2019-01-27 DIAGNOSIS — Z7189 Other specified counseling: Secondary | ICD-10-CM | POA: Diagnosis not present

## 2019-01-27 DIAGNOSIS — E538 Deficiency of other specified B group vitamins: Secondary | ICD-10-CM | POA: Diagnosis not present

## 2019-01-27 DIAGNOSIS — N289 Disorder of kidney and ureter, unspecified: Secondary | ICD-10-CM

## 2019-01-27 DIAGNOSIS — I10 Essential (primary) hypertension: Secondary | ICD-10-CM | POA: Diagnosis not present

## 2019-01-27 DIAGNOSIS — D126 Benign neoplasm of colon, unspecified: Secondary | ICD-10-CM

## 2019-01-27 DIAGNOSIS — Z0000 Encounter for general adult medical examination without abnormal findings: Secondary | ICD-10-CM | POA: Diagnosis not present

## 2019-01-27 DIAGNOSIS — E785 Hyperlipidemia, unspecified: Secondary | ICD-10-CM | POA: Diagnosis not present

## 2019-01-27 DIAGNOSIS — Z1239 Encounter for other screening for malignant neoplasm of breast: Secondary | ICD-10-CM

## 2019-01-27 DIAGNOSIS — D509 Iron deficiency anemia, unspecified: Secondary | ICD-10-CM | POA: Diagnosis not present

## 2019-01-27 DIAGNOSIS — M810 Age-related osteoporosis without current pathological fracture: Secondary | ICD-10-CM

## 2019-01-27 LAB — VITAMIN B12: Vitamin B-12: >1500 pg/mL — ABNORMAL HIGH (ref 211–911)

## 2019-01-27 LAB — CBC WITH DIFFERENTIAL/PLATELET
Basophils Absolute: 0 10*3/uL (ref 0.0–0.1)
Basophils Relative: 0.4 % (ref 0.0–3.0)
Eosinophils Absolute: 0.1 10*3/uL (ref 0.0–0.7)
Eosinophils Relative: 1.3 % (ref 0.0–5.0)
HCT: 34.7 % — ABNORMAL LOW (ref 36.0–46.0)
Hemoglobin: 11.2 g/dL — ABNORMAL LOW (ref 12.0–15.0)
Lymphocytes Relative: 14.4 % (ref 12.0–46.0)
Lymphs Abs: 0.9 10*3/uL (ref 0.7–4.0)
MCHC: 32.2 g/dL (ref 30.0–36.0)
MCV: 90.4 fl (ref 78.0–100.0)
Monocytes Absolute: 0.7 10*3/uL (ref 0.1–1.0)
Monocytes Relative: 11.8 % (ref 3.0–12.0)
Neutro Abs: 4.3 10*3/uL (ref 1.4–7.7)
Neutrophils Relative %: 72.1 % (ref 43.0–77.0)
Platelets: 265 10*3/uL (ref 150.0–400.0)
RBC: 3.84 Mil/uL — ABNORMAL LOW (ref 3.87–5.11)
RDW: 13.7 % (ref 11.5–15.5)
WBC: 6 10*3/uL (ref 4.0–10.5)

## 2019-01-27 LAB — LIPID PANEL
Cholesterol: 219 mg/dL — ABNORMAL HIGH (ref 0–200)
HDL: 63.6 mg/dL (ref >39.00)
LDL Cholesterol: 132 mg/dL — ABNORMAL HIGH (ref 0–99)
NonHDL: 155.46
Total CHOL/HDL Ratio: 3
Triglycerides: 117 mg/dL (ref 0.0–149.0)
VLDL: 23.4 mg/dL (ref 0.0–40.0)

## 2019-01-27 LAB — COMPREHENSIVE METABOLIC PANEL
ALT: 12 U/L (ref 0–35)
AST: 21 U/L (ref 0–37)
Albumin: 4.4 g/dL (ref 3.5–5.2)
Alkaline Phosphatase: 45 U/L (ref 39–117)
BUN: 19 mg/dL (ref 6–23)
CO2: 27 mEq/L (ref 19–32)
Calcium: 10 mg/dL (ref 8.4–10.5)
Chloride: 102 mEq/L (ref 96–112)
Creatinine, Ser: 1.13 mg/dL (ref 0.40–1.20)
GFR: 56.36 mL/min — ABNORMAL LOW (ref >60.00)
Glucose, Bld: 107 mg/dL — ABNORMAL HIGH (ref 70–99)
Potassium: 4.3 mEq/L (ref 3.5–5.1)
Sodium: 139 mEq/L (ref 135–145)
Total Bilirubin: 0.3 mg/dL (ref 0.2–1.2)
Total Protein: 8 g/dL (ref 6.0–8.3)

## 2019-01-27 LAB — TSH: TSH: 3.23 u[IU]/mL (ref 0.35–4.50)

## 2019-01-27 LAB — FERRITIN: Ferritin: 260.4 ng/mL (ref 10.0–291.0)

## 2019-01-27 MED ORDER — HYDROCHLOROTHIAZIDE 25 MG PO TABS: ORAL_TABLET | ORAL | 3 refills | Status: DC

## 2019-01-27 MED ORDER — ALENDRONATE SODIUM 70 MG PO TABS: 70.0000 mg | ORAL_TABLET | ORAL | 3 refills | Status: DC

## 2019-01-27 MED ORDER — POTASSIUM CHLORIDE CRYS ER 10 MEQ PO TBCR: 10.0000 meq | EXTENDED_RELEASE_TABLET | Freq: Every day | ORAL | 3 refills | Status: DC

## 2019-01-27 MED ORDER — ENALAPRIL MALEATE 20 MG PO TABS: 20.0000 mg | ORAL_TABLET | Freq: Every day | ORAL | 3 refills | Status: DC

## 2019-01-27 NOTE — Assessment & Plan Note (Signed)
Overdue for colonoscopy - will refer to GI.

## 2019-01-27 NOTE — Assessment & Plan Note (Signed)

## 2019-01-27 NOTE — Patient Instructions (Addendum)
Fasting labs today  We will refer you back to GI for repeat colonoscopy.  Call to schedule bone density appointment. We will also set you up for mammogram.  Advanced directive packet provided today. You are doing well today. Return as needed or in 1 year for next wellness visit  Health Maintenance After Age 78 After age 69, you are at a higher risk for certain long-term diseases and infections as well as injuries from falls. Falls are a major cause of broken bones and head injuries in people who are older than age 35. Getting regular preventive care can help to keep you healthy and well. Preventive care includes getting regular testing and making lifestyle changes as recommended by your health care provider. Talk with your health care provider about:  Which screenings and tests you should have. A screening is a test that checks for a disease when you have no symptoms.  A diet and exercise plan that is right for you. What should I know about screenings and tests to prevent falls? Screening and testing are the best ways to find a health problem early. Early diagnosis and treatment give you the best chance of managing medical conditions that are common after age 21. Certain conditions and lifestyle choices may make you more likely to have a fall. Your health care provider may recommend:  Regular vision checks. Poor vision and conditions such as cataracts can make you more likely to have a fall. If you wear glasses, make sure to get your prescription updated if your vision changes.  Medicine review. Work with your health care provider to regularly review all of the medicines you are taking, including over-the-counter medicines. Ask your health care provider about any side effects that may make you more likely to have a fall. Tell your health care provider if any medicines that you take make you feel dizzy or sleepy.  Osteoporosis screening. Osteoporosis is a condition that causes the bones to get  weaker. This can make the bones weak and cause them to break more easily.  Blood pressure screening. Blood pressure changes and medicines to control blood pressure can make you feel dizzy.  Strength and balance checks. Your health care provider may recommend certain tests to check your strength and balance while standing, walking, or changing positions.  Foot health exam. Foot pain and numbness, as well as not wearing proper footwear, can make you more likely to have a fall.  Depression screening. You may be more likely to have a fall if you have a fear of falling, feel emotionally low, or feel unable to do activities that you used to do.  Alcohol use screening. Using too much alcohol can affect your balance and may make you more likely to have a fall. What actions can I take to lower my risk of falls? General instructions  Talk with your health care provider about your risks for falling. Tell your health care provider if: ? You fall. Be sure to tell your health care provider about all falls, even ones that seem minor. ? You feel dizzy, sleepy, or off-balance.  Take over-the-counter and prescription medicines only as told by your health care provider. These include any supplements.  Eat a healthy diet and maintain a healthy weight. A healthy diet includes low-fat dairy products, low-fat (lean) meats, and fiber from whole grains, beans, and lots of fruits and vegetables. Home safety  Remove any tripping hazards, such as rugs, cords, and clutter.  Install safety equipment such as grab  bars in bathrooms and safety rails on stairs.  Keep rooms and walkways well-lit. Activity   Follow a regular exercise program to stay fit. This will help you maintain your balance. Ask your health care provider what types of exercise are appropriate for you.  If you need a cane or walker, use it as recommended by your health care provider.  Wear supportive shoes that have nonskid soles. Lifestyle  Do  not drink alcohol if your health care provider tells you not to drink.  If you drink alcohol, limit how much you have: ? 0-1 drink a day for women. ? 0-2 drinks a day for men.  Be aware of how much alcohol is in your drink. In the [place] one drink equals one typical bottle of beer (12 oz), one-half glass of wine (5 oz), or one shot of hard liquor (1 oz).  Do not use any products that contain nicotine or tobacco, such as cigarettes and e-cigarettes. If you need help quitting, ask your health care provider. Summary  Having a healthy lifestyle and getting preventive care can help to protect your health and wellness after age 75.  Screening and testing are the best way to find a health problem early and help you avoid having a fall. Early diagnosis and treatment give you the best chance for managing medical conditions that are more common for people who are older than age 59.  Falls are a major cause of broken bones and head injuries in people who are older than age 22. Take precautions to prevent a fall at home.  Work with your health care provider to learn what changes you can make to improve your health and wellness and to prevent falls. This information is not intended to replace advice given to you by your health care provider. Make sure you discuss any questions you have with your health care provider. Document Released: 05/12/2017 Document Revised: 10/20/2018 Document Reviewed: 05/12/2017 Elsevier Patient Education  2020 [employer].

## 2019-01-27 NOTE — Assessment & Plan Note (Signed)
Chronic, off meds. Update labs. The 10-year ASCVD risk score Mikey Bussing DC Brooke Bonito., et al., 2013) is: 18.8%   Values used to calculate the score:     Age: 78 years     Sex: Female     Is Non-Hispanic African American: Yes     Diabetic: No     Tobacco smoker: No     Systolic Blood Pressure: 277 mmHg     Is BP treated: Yes     HDL Cholesterol: 88.4 mg/dL     Total Cholesterol: 197 mg/dL

## 2019-01-27 NOTE — Progress Notes (Signed)
This visit was conducted in person.  BP 118/66 (BP Location: Left Arm, Patient Position: Sitting, Cuff Size: Normal)   Pulse 89   Temp 97.6 F (36.4 C) (Temporal)   Ht 5' 0.05" (1.525 m)   Wt 105 lb 1 oz (47.7 kg)   SpO2 100%   BMI 20.48 kg/m    CC: AMW Subjective:    Patient ID: Margaret Nguyen, female    DOB: 08/31/1940, 78 y.o.   MRN: [id number]  HPI: Margaret Nguyen is a 78 y.o. female presenting on 01/27/2019 for Medicare Wellness   Did not see health coach this year.    Hearing Screening   125Hz  250Hz  500Hz  1000Hz  2000Hz  3000Hz  4000Hz  6000Hz  8000Hz   Right ear:   25 40 25  40    Left ear:   40 0 40  0      Visual Acuity Screening   Right eye Left eye Both eyes  Without correction:     With correction: 20/30 20/25 20/25   1 fall in the past year - syncope Passes depression screen.   Preventative: COLONOSCOPY Date: 04/2014 mult TA, one with high grade dysplasia, mod diverticulosis, rpt 3 yrs [facility] Plan) - overdue for f/u agrees to return Lung cancer screening - not candidate  Breast cancer screening - WNL 11/2016 - agrees to repeat today. She doesn't do breast exams at home.  Well woman exam - s/p partial hysterectomy. Ovaries remain. Denies pelvic pain or pressure.  DEXA Date: 04/2015 T -3 hips, -1.8. DEXA 11/2016 hip -3.4. Doesn't remember to take oral bisphosphonate, unable to afford prolia. Received IV reclast 12/03/2017 - increased cramping with this, does not want to repeat injection. Would like to try fosamax again. Flu shotyearly Pneumovax 02/2006, prevnar2/2017 Tdap 01/2011 Shingles shot - declines due to financial reasons Advanced directive discussion - Discussed. Advanced directive packet provided today.She has talked with family. Daughter RN would be HCPOA. Doesn't want prolonged life support if terminal condition. Seat belt use discussed Sunscreen use discussed, no changing moles on skin. Non smoker Alcohol - 1-2 shots brandy regularly Dentist  - overdue for visit Eye exam overdue Bowel - no constipation Bladder - no incontinence  Married [phone], widowed 1 son, 2 daughters, 1 son deceased 5 grandchildren Lives alone; I- ADLs Retired, used to work [employer] Activity: some walking Diet: good water, fruits/vegetables daily     Relevant past medical, surgical, family and social history reviewed and updated as indicated. Interim medical history since our last visit reviewed. Allergies and medications reviewed and updated. Outpatient Medications Prior to Visit  Medication Sig Dispense Refill  . acetaminophen (TYLENOL) 500 MG tablet Take 1 tablet (500 mg total) by mouth every 6 (six) hours as needed. 30 tablet 0  . Calcium Carb-Cholecalciferol (CALCIUM 500 +D) 500-400 MG-UNIT TABS Take by mouth.      . Cholecalciferol (VITAMIN D3) 1000 UNITS CAPS Take 1 capsule by mouth daily.    . colchicine 0.6 MG tablet Take 1 tablet (0.6 mg total) by mouth daily as needed (foot/joint pain). 30 tablet 0  . ferrous sulfate 325 (65 FE) MG tablet Take 1 tablet (325 mg total) by mouth every Monday, Wednesday, and Friday.  3  . Multiple Vitamins-Minerals (CENTRUM PO) Take by mouth.      . vitamin B-12 (CYANOCOBALAMIN) 500 MCG tablet Take 1 tablet (500 mcg total) daily by mouth. (Patient taking differently: Take 500 mcg by mouth 2 (two) times a week. Takes 1/2 tablet)    .  vitamin C (ASCORBIC ACID) 500 MG tablet Take 500 mg by mouth daily.    . enalapril (VASOTEC) 20 MG tablet TAKE 1 TABLET(20 MG) BY MOUTH DAILY 90 tablet 2  . hydrochlorothiazide (HYDRODIURIL) 25 MG tablet TAKE 1 TABLET(25 MG) BY MOUTH EVERY MORNING 90 tablet 1  . potassium chloride (K-DUR) 10 MEQ tablet TAKE 1 TABLET BY MOUTH EVERY DAY 90 tablet 0  . alendronate (FOSAMAX) 70 MG tablet Take 1 tablet (70 mg total) by mouth every 7 (seven) days. Take with a full glass of water on an empty stomach. (Patient not taking: Reported on 01/27/2019) 4 tablet 11   No facility-administered  medications prior to visit.      Per HPI unless specifically indicated in ROS section below Review of Systems Objective:    BP 118/66 (BP Location: Left Arm, Patient Position: Sitting, Cuff Size: Normal)   Pulse 89   Temp 97.6 F (36.4 C) (Temporal)   Ht 5' 0.05" (1.525 m)   Wt 105 lb 1 oz (47.7 kg)   SpO2 100%   BMI 20.48 kg/m   Wt Readings from Last 3 Encounters:  01/27/19 105 lb 1 oz (47.7 kg)  04/14/18 99 lb (44.9 kg)  03/30/18 99 lb 8 oz (45.1 kg)    Physical Exam Vitals signs and nursing note reviewed.  Constitutional:      General: She is not in acute distress.    Appearance: Normal appearance. She is well-developed. She is not ill-appearing.  HENT:     Head: Normocephalic and atraumatic.     Right Ear: Hearing, tympanic membrane, ear canal and external ear normal.     Left Ear: Hearing, tympanic membrane, ear canal and external ear normal.     Nose: Nose normal.     Mouth/Throat:     Mouth: Mucous membranes are moist.     Pharynx: Uvula midline. No oropharyngeal exudate or posterior oropharyngeal erythema.  Eyes:     General: No scleral icterus.    Extraocular Movements: Extraocular movements intact.     Conjunctiva/sclera: Conjunctivae normal.     Pupils: Pupils are equal, round, and reactive to light.  Neck:     Musculoskeletal: Normal range of motion and neck supple.  Cardiovascular:     Rate and Rhythm: Normal rate and regular rhythm.     Pulses: Normal pulses.          Radial pulses are 2+ on the right side and 2+ on the left side.     Heart sounds: Normal heart sounds. No murmur.  Pulmonary:     Effort: Pulmonary effort is normal. No respiratory distress.     Breath sounds: Normal breath sounds. No wheezing, rhonchi or rales.  Abdominal:     General: Abdomen is flat. Bowel sounds are normal. There is no distension.     Palpations: Abdomen is soft. There is no mass.     Tenderness: There is no abdominal tenderness. There is no guarding or rebound.      Hernia: No hernia is present.  Musculoskeletal: Normal range of motion.  Lymphadenopathy:     Cervical: No cervical adenopathy.  Skin:    General: Skin is warm and dry.     Findings: No rash.  Neurological:     General: No focal deficit present.     Mental Status: She is alert and oriented to person, place, and time.     Comments:  CN grossly intact, station and gait intact Recall 3/3 Calculation 3/5 serial 3s.  Psychiatric:        Mood and Affect: Mood normal.        Behavior: Behavior normal.        Thought Content: Thought content normal.        Judgment: Judgment normal.       Results for orders placed or performed in visit on 04/14/18  TSH  Result Value Ref Range   TSH 4.24 0.35 - 4.50 uIU/mL  T4, free  Result Value Ref Range   Free T4 0.64 0.60 - 1.60 ng/dL  Renal function panel  Result Value Ref Range   Sodium 138 135 - 145 mEq/L   Potassium 3.9 3.5 - 5.1 mEq/L   Chloride 103 96 - 112 mEq/L   CO2 28 19 - 32 mEq/L   Calcium 9.6 8.4 - 10.5 mg/dL   Albumin 4.2 3.5 - 5.2 g/dL   BUN 22 6 - 23 mg/dL   Creatinine, Ser 1.21 (H) 0.40 - 1.20 mg/dL   Glucose, Bld 111 (H) 70 - 99 mg/dL   Phosphorus 3.5 2.3 - 4.6 mg/dL   GFR 55.47 (L) >60.00 mL/min   Assessment & Plan:   Problem List Items Addressed This Visit    Vitamin B12 deficiency   Relevant Orders   Vitamin B12   Renal insufficiency   Osteoporosis    Continue fosamax, update DEXA.       Relevant Medications   alendronate (FOSAMAX) 70 MG tablet   Other Relevant Orders   DG Bone Density   Medicare annual wellness visit, subsequent - Primary    I have personally reviewed the Medicare Annual Wellness questionnaire and have noted 1. The patient's medical and social history 2. Their use of alcohol, tobacco or illicit drugs 3. Their current medications and supplements 4. The patient's functional ability including ADL's, fall risks, home safety risks and hearing or visual impairment. Cognitive function has been  assessed and addressed as indicated.  5. Diet and physical activity 6. Evidence for depression or mood disorders The patients weight, height, BMI have been recorded in the chart. I have made referrals, counseling and provided education to the patient based on review of the above and I have provided the pt with a written personalized care plan for preventive services. Provider list updated.. See scanned questionairre as needed for further documentation. Reviewed preventative protocols and updated unless pt declined.       Iron deficiency anemia   Relevant Orders   CBC with Differential/Platelet   Ferritin   Essential hypertension    Chronic, stable. Continue current regimen.       Relevant Medications   enalapril (VASOTEC) 20 MG tablet   hydrochlorothiazide (HYDRODIURIL) 25 MG tablet   Other Relevant Orders   TSH   Dyslipidemia    Chronic, off meds. Update labs. The 10-year ASCVD risk score Mikey Bussing DC Brooke Bonito., et al., 2013) is: 18.8%   Values used to calculate the score:     Age: 54 years     Sex: Female     Is Non-Hispanic African American: Yes     Diabetic: No     Tobacco smoker: No     Systolic Blood Pressure: 762 mmHg     Is BP treated: Yes     HDL Cholesterol: 88.4 mg/dL     Total Cholesterol: 197 mg/dL       Relevant Orders   Lipid panel   Comprehensive metabolic panel   Advanced care planning/counseling discussion    Advanced directive discussion -  Discussed. Advanced directive packet provided today.She has talked with family. Daughter RN would be HCPOA. Doesn't want prolonged life support if terminal condition.      Adenomatous colon polyp    Overdue for colonoscopy - will refer to GI.       Relevant Orders   Ambulatory referral to Gastroenterology    Other Visit Diagnoses    Breast cancer screening       Relevant Orders   MM 3D SCREEN BREAST BILATERAL       Meds ordered this encounter  Medications  . enalapril (VASOTEC) 20 MG tablet    Sig: Take 1  tablet (20 mg total) by mouth daily.    Dispense:  90 tablet    Refill:  3  . hydrochlorothiazide (HYDRODIURIL) 25 MG tablet    Sig: TAKE 1 TABLET(25 MG) BY MOUTH EVERY MORNING    Dispense:  90 tablet    Refill:  3  . potassium chloride (K-DUR) 10 MEQ tablet    Sig: Take 1 tablet (10 mEq total) by mouth daily.    Dispense:  90 tablet    Refill:  3  . alendronate (FOSAMAX) 70 MG tablet    Sig: Take 1 tablet (70 mg total) by mouth every 7 (seven) days. Take with a full glass of water on an empty stomach.    Dispense:  12 tablet    Refill:  3   Orders Placed This Encounter  Procedures  . MM 3D SCREEN BREAST BILATERAL    Standing Status:   Future    Standing Expiration Date:   03/29/2020    Scheduling Instructions:     At [facility] Specific Question:   Reason for Exam (SYMPTOM  OR DIAGNOSIS REQUIRED)    Answer:   breast cancer screening    Order Specific Question:   Preferred imaging location?    Answer:   External  . DG Bone Density    Standing Status:   Future    Standing Expiration Date:   03/29/2020    Scheduling Instructions:     At [facility] Specific Question:   Reason for Exam (SYMPTOM  OR DIAGNOSIS REQUIRED)    Answer:   osteoporosis    Order Specific Question:   Preferred imaging location?    Answer:   External  . Lipid panel  . Comprehensive metabolic panel  . TSH  . CBC with Differential/Platelet  . Ferritin  . Vitamin B12  . Ambulatory referral to Gastroenterology    Referral Priority:   Routine    Referral Type:   Consultation    Referral Reason:   Specialty Services Required    Number of Visits Requested:   1    Follow up plan: Return in about 1 year (around 01/27/2020), or if symptoms worsen or fail to improve, for medicare wellness visit.  Ria Bush, MD

## 2019-01-27 NOTE — Assessment & Plan Note (Signed)
Continue fosamax, update DEXA.

## 2019-01-27 NOTE — Assessment & Plan Note (Signed)
Chronic, stable. Continue current regimen. 

## 2019-01-27 NOTE — Assessment & Plan Note (Signed)
Advanced directive discussion - Discussed. Advanced directive packet provided today.She has talked with family. Daughter RN would be HCPOA. Doesn't want prolonged life support if terminal condition. 

## 2019-02-03 ENCOUNTER — Telehealth: Payer: Self-pay

## 2019-02-03 NOTE — Telephone Encounter (Signed)
Left message to call back in regards to results

## 2019-02-06 NOTE — Telephone Encounter (Signed)
Spoke with pt.  [See Labs Result notes, 01/27/19.]

## 2019-02-06 NOTE — Telephone Encounter (Signed)
Pt returned your call She hung up before I could get her phone number

## 2019-03-09 ENCOUNTER — Encounter: Payer: Self-pay | Admitting: Gastroenterology

## 2019-03-14 ENCOUNTER — Encounter: Payer: Self-pay | Admitting: Family Medicine

## 2019-03-14 LAB — HM MAMMOGRAPHY

## 2019-03-16 ENCOUNTER — Encounter: Payer: Self-pay | Admitting: Family Medicine

## 2019-03-16 ENCOUNTER — Telehealth: Payer: Self-pay

## 2019-03-16 NOTE — Telephone Encounter (Signed)
Left message on vm per dpr notifying pt her mammogram results are normal.

## 2019-03-27 ENCOUNTER — Ambulatory Visit (AMBULATORY_SURGERY_CENTER): Payer: Self-pay | Admitting: *Deleted

## 2019-03-27 ENCOUNTER — Other Ambulatory Visit: Payer: Self-pay

## 2019-03-27 VITALS — Temp 96.9°F | Ht 61.0 in | Wt 100.8 lb

## 2019-03-27 DIAGNOSIS — Z86010 Personal history of colonic polyps: Secondary | ICD-10-CM

## 2019-03-27 NOTE — Progress Notes (Signed)
Patient is here in-person for PV. Patient denies any allergies to eggs or soy. Patient denies any problems with anesthesia/sedation. Patient denies any oxygen use at home. Patient denies taking any diet/weight loss medications or blood thinners. Patient is not being treated for MRSA or C-diff.  Pt request same Miralax prep as she did last colon.  Pt is aware that care partner will wait in the car during procedure; if they feel like they will be too hot to wait in the car; they may wait in the lobby.  We want them to wear a mask (we do not have any that we can provide them), practice social distancing, and we will check their temperatures when they get here.  I did remind patient that their care partner needs to stay in the parking lot the entire time. Pt will wear mask into building.

## 2019-03-29 NOTE — Telephone Encounter (Signed)
Plz notify bone density scan returned showing persistent but stable osteoporosis - a bit improved from last check - continue fosamax.

## 2019-03-29 NOTE — Telephone Encounter (Signed)
Pt notified as instructed.  Verbalizes understanding.  

## 2019-04-07 ENCOUNTER — Telehealth: Payer: Self-pay | Admitting: Gastroenterology

## 2019-04-07 NOTE — Telephone Encounter (Signed)
°

## 2019-04-08 ENCOUNTER — Encounter: Payer: Self-pay | Admitting: Gastroenterology

## 2019-04-08 ENCOUNTER — Other Ambulatory Visit: Payer: Self-pay

## 2019-04-08 ENCOUNTER — Ambulatory Visit (AMBULATORY_SURGERY_CENTER): Payer: Medicare Other | Admitting: Gastroenterology

## 2019-04-08 VITALS — BP 101/68 | HR 66 | Temp 98.5°F | Resp 16 | Ht 61.5 in | Wt 100.0 lb

## 2019-04-08 DIAGNOSIS — Z86010 Personal history of colonic polyps: Secondary | ICD-10-CM | POA: Diagnosis not present

## 2019-04-08 DIAGNOSIS — D122 Benign neoplasm of ascending colon: Secondary | ICD-10-CM

## 2019-04-08 MED ORDER — SODIUM CHLORIDE 0.9 % IV SOLN: 500.0000 mL | Freq: Once | INTRAVENOUS | Status: DC

## 2019-04-08 NOTE — Patient Instructions (Signed)
Handouts given for polyps, diverticulosis, hemorrhoids and high fiber diet.  You don't need another colonoscopy due to your age.  YOU HAD AN ENDOSCOPIC PROCEDURE TODAY AT [facility]:   Refer to the procedure report that was given to you for any specific questions about what was found during the examination.  If the procedure report does not answer your questions, please call your gastroenterologist to clarify.  If you requested that your care partner not be given the details of your procedure findings, then the procedure report has been included in a sealed envelope for you to review at your convenience later.  YOU SHOULD EXPECT: Some feelings of bloating in the abdomen. Passage of more gas than usual.  Walking can help get rid of the air that was put into your GI tract during the procedure and reduce the bloating. If you had a lower endoscopy (such as a colonoscopy or flexible sigmoidoscopy) you may notice spotting of blood in your stool or on the toilet paper. If you underwent a bowel prep for your procedure, you may not have a normal bowel movement for a few days.  Please Note:  You might notice some irritation and congestion in your nose or some drainage.  This is from the oxygen used during your procedure.  There is no need for concern and it should clear up in a day or so.  SYMPTOMS TO REPORT IMMEDIATELY:   Following lower endoscopy (colonoscopy or flexible sigmoidoscopy):  Excessive amounts of blood in the stool  Significant tenderness or worsening of abdominal pains  Swelling of the abdomen that is new, acute  Fever of 100F or higher  For urgent or emergent issues, a gastroenterologist can be reached at any hour by calling [phone].   DIET:  We do recommend a small meal at first, but then you may proceed to your regular diet.  Drink plenty of fluids but you should avoid alcoholic beverages for 24 hours.  ACTIVITY:  You should plan to take it easy for the rest  of today and you should NOT DRIVE or use heavy machinery until tomorrow (because of the sedation medicines used during the test).    FOLLOW UP: Our staff will call the number listed on your records 48-72 hours following your procedure to check on you and address any questions or concerns that you may have regarding the information given to you following your procedure. If we do not reach you, we will leave a message.  We will attempt to reach you two times.  During this call, we will ask if you have developed any symptoms of COVID 19. If you develop any symptoms (ie: fever, flu-like symptoms, shortness of breath, cough etc.) before then, please call [phone].  If you test positive for Covid 19 in the 2 weeks post procedure, please call and report this information to [place].    If any biopsies were taken you will be contacted by phone or by letter within the next 1-3 weeks.  Please call us at [phone] if you have not heard about the biopsies in 3 weeks.    SIGNATURES/CONFIDENTIALITY: You and/or your care partner have signed paperwork which will be entered into your electronic medical record.  These signatures attest to the fact that that the information above on your After Visit Summary has been reviewed and is understood.  Full responsibility of the confidentiality of this discharge information lies with you and/or your care-partner.

## 2019-04-08 NOTE — Progress Notes (Signed)
Report given to PACU, vss 

## 2019-04-08 NOTE — Progress Notes (Signed)
Temperature- [place]  Pt's states no medical or surgical changes since previsit or office visit.

## 2019-04-08 NOTE — Op Note (Signed)
[facility] Patient Name: Margaret Nguyen Procedure Date: 04/08/2019 8:02 AM MRN: [id number] Endoscopist: Ladene Artist , MD Age: 78 Referring MD:  Date of Birth: [birth year] Gender: Female Account #: [id number] Procedure:                Colonoscopy Indications:              Surveillance: Personal history of adenomatous                            polyps on last colonoscopy 5 years ago Medicines:                Monitored Anesthesia Care Procedure:                Pre-Anesthesia Assessment:                           - Prior to the procedure, a History and Physical                            was performed, and patient medications and                            allergies were reviewed. The patient's tolerance of                            previous anesthesia was also reviewed. The risks                            and benefits of the procedure and the sedation                            options and risks were discussed with the patient.                            All questions were answered, and informed consent                            was obtained. Prior Anticoagulants: The patient has                            taken no previous anticoagulant or antiplatelet                            agents. ASA Grade Assessment: II - A patient with                            mild systemic disease. After reviewing the risks                            and benefits, the patient was deemed in                            satisfactory condition to undergo the procedure.  After obtaining informed consent, the colonoscope                            was passed under direct vision. Throughout the                            procedure, the patient's blood pressure, pulse, and                            oxygen saturations were monitored continuously. The                            Colonoscope was introduced through the anus and                            advanced to the the  cecum, identified by                            appendiceal orifice and ileocecal valve. The                            ileocecal valve, appendiceal orifice, and rectum                            were photographed. The quality of the bowel                            preparation was good. The colonoscopy was performed                            without difficulty. The patient tolerated the                            procedure well. Scope In: 8:33:55 AM Scope Out: 8:43:26 AM Scope Withdrawal Time: 0 hours 7 minutes 51 seconds  Total Procedure Duration: 0 hours 9 minutes 31 seconds  Findings:                 The perianal and digital rectal examinations were                            normal.                           A 6 mm polyp was found in the ascending colon. The                            polyp was sessile. The polyp was removed with a                            cold snare. Resection and retrieval were complete.                           Many medium-mouthed diverticula were found in the  entire colon. There was no evidence of diverticular                            bleeding.                           There was evidence of a prior end-to-end                            colo-colonic anastomosis in the sigmoid colon. This                            was patent and was characterized by healthy                            appearing mucosa. The anastomosis was traversed.                           Internal hemorrhoids were found during                            retroflexion. The hemorrhoids were small and Grade                            I (internal hemorrhoids that do not prolapse).                           The exam was otherwise without abnormality on                            direct and retroflexion views. Complications:            No immediate complications. Estimated blood loss:                            None. Estimated Blood Loss:     Estimated blood loss:  none. Impression:               - One 6 mm polyp in the ascending colon, removed                            with a cold snare. Resected and retrieved.                           - Moderate diverticulosis in the entire examined                            colon.                           - Patent end-to-end colo-colonic anastomosis,                            characterized by healthy appearing mucosa.                           -  Internal hemorrhoids.                           - The examination was otherwise normal on direct                            and retroflexion views. Recommendation:           - Patient has a contact number available for                            emergencies. The signs and symptoms of potential                            delayed complications were discussed with the                            patient. Return to normal activities tomorrow.                            Written discharge instructions were provided to the                            patient.                           - High fiber diet.                           - Continue present medications.                           - Await pathology results.                           - No repeat colonoscopy due to age. Ladene Artist, MD 04/08/2019 8:50:32 AM This report has been signed electronically.

## 2019-04-11 ENCOUNTER — Telehealth: Payer: Self-pay

## 2019-04-11 NOTE — Telephone Encounter (Signed)
First attempt follow up call to pt, LM on VM

## 2019-04-11 NOTE — Telephone Encounter (Signed)
No answer, left message to call if having any issues or concerns, B.Jay Kempe RN 

## 2019-04-14 ENCOUNTER — Encounter: Payer: Self-pay | Admitting: Gastroenterology

## 2019-04-24 IMAGING — DX DG CHEST 1V PORT
1 series · 1 of 1 positions shown · non-contrast
Comparison: CT chest 04/26/2007

CLINICAL DATA: Syncope

EXAM:
PORTABLE CHEST 1 VIEW

[chest ap]
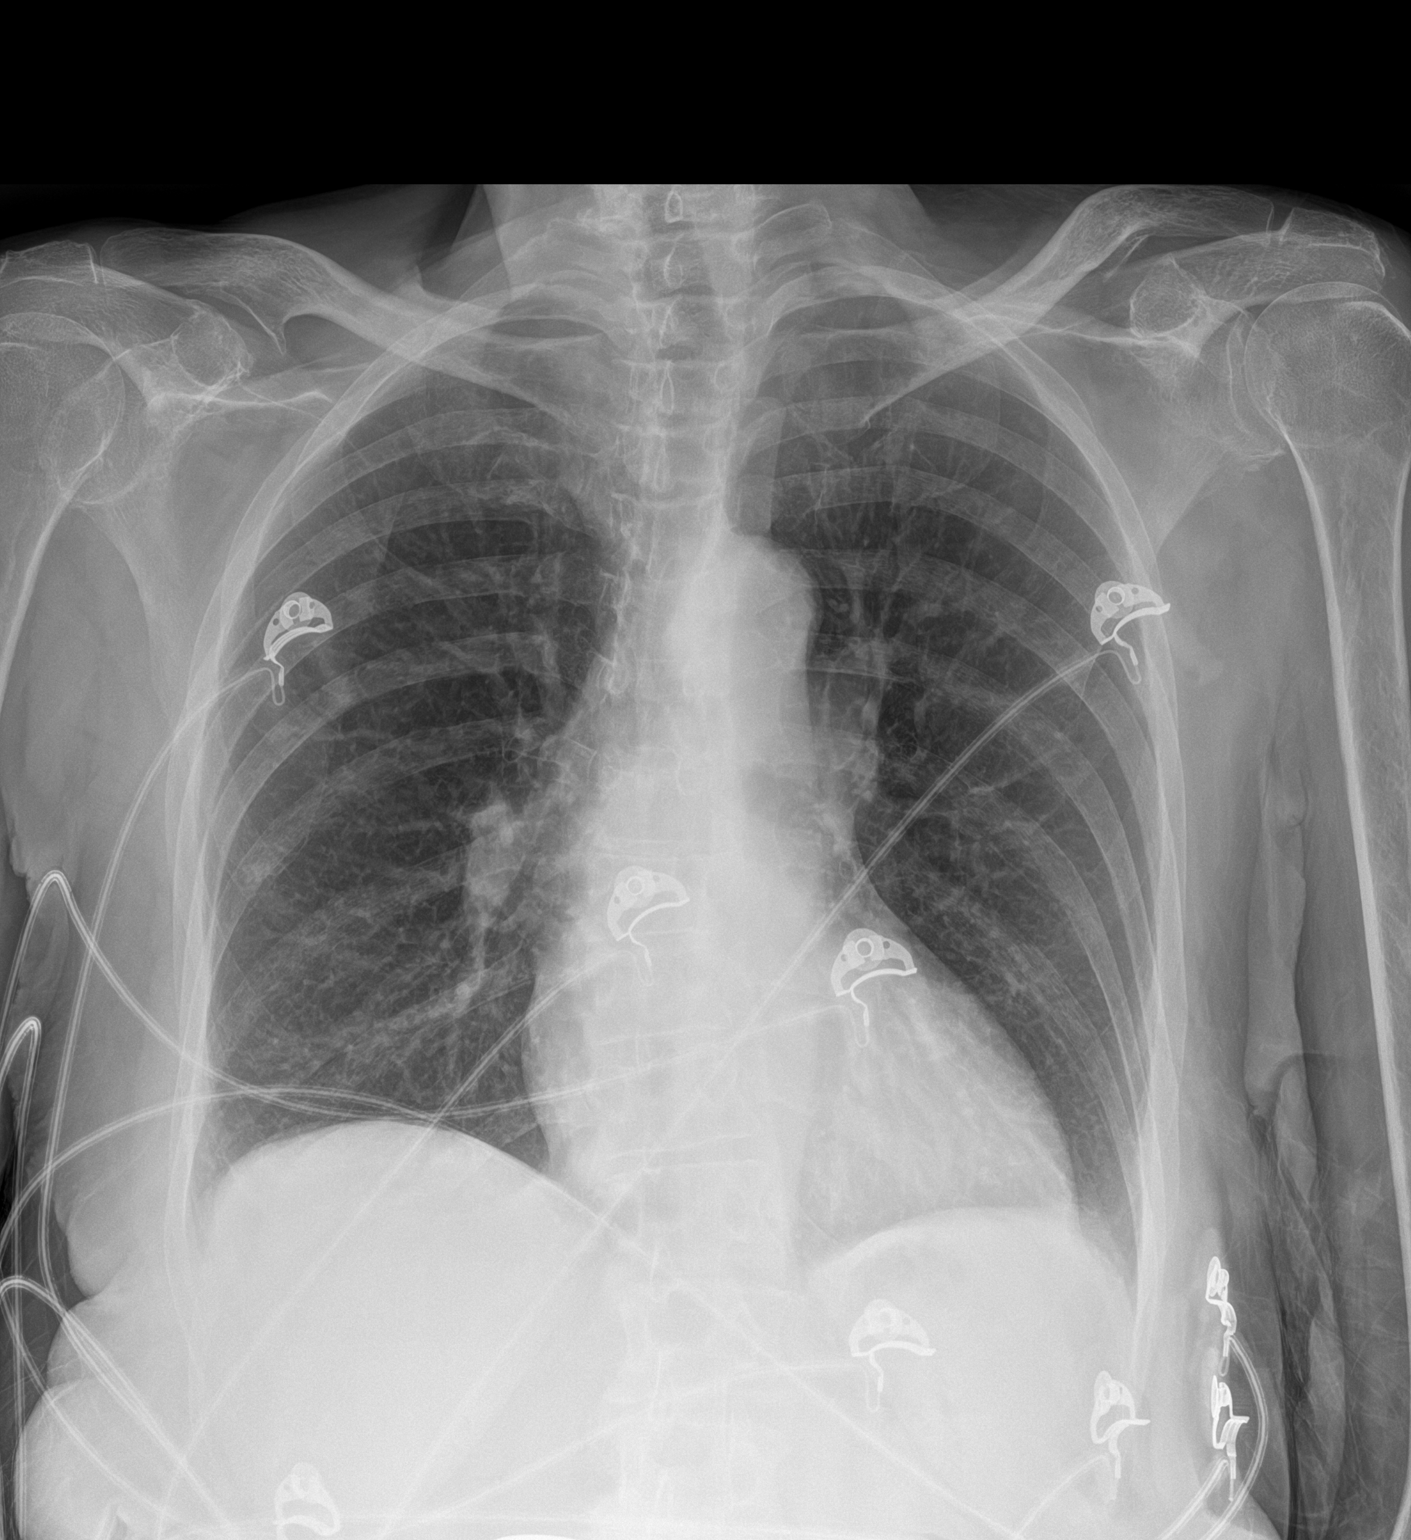

[1 of 1 positions shown; findings below may reference images not displayed]

FINDINGS: There is no focal parenchymal opacity. There is no pleural effusion
or pneumothorax. The heart and mediastinal contours are
unremarkable.

The osseous structures are unremarkable.
IMPRESSION: No active disease.

## 2019-05-15 ENCOUNTER — Other Ambulatory Visit: Payer: Self-pay

## 2019-05-15 ENCOUNTER — Ambulatory Visit (INDEPENDENT_AMBULATORY_CARE_PROVIDER_SITE_OTHER): Payer: Medicare HMO | Admitting: *Deleted

## 2019-05-15 DIAGNOSIS — Z23 Encounter for immunization: Secondary | ICD-10-CM | POA: Diagnosis not present

## 2019-06-29 ENCOUNTER — Encounter: Payer: Self-pay | Admitting: Family Medicine

## 2019-08-04 ENCOUNTER — Telehealth: Payer: Self-pay | Admitting: Family Medicine

## 2019-08-04 NOTE — Telephone Encounter (Signed)
Patient requesting a call back   She stated she has an upcoming dentist appointment that she wanted to get some information from our office before the appointment., she requesting to speak with you

## 2019-08-04 NOTE — Telephone Encounter (Signed)
Pt reports her dentist is always asking for her health records and she thinks they have requested them but she also wants her entire file mailed to her.  Advised pt will need to call HIM and provided the number for HIM. Pt also requested her med list. Printed and mailed med list. Pt appreciative and verbalized understanding.

## 2019-08-09 ENCOUNTER — Telehealth: Payer: Self-pay | Admitting: Family Medicine

## 2019-08-09 MED ORDER — ENALAPRIL MALEATE 20 MG PO TABS: 20.0000 mg | ORAL_TABLET | Freq: Every day | ORAL | 1 refills | Status: DC

## 2019-08-09 MED ORDER — HYDROCHLOROTHIAZIDE 25 MG PO TABS: ORAL_TABLET | ORAL | 1 refills | Status: DC

## 2019-08-09 MED ORDER — ALENDRONATE SODIUM 70 MG PO TABS: 70.0000 mg | ORAL_TABLET | ORAL | 3 refills | Status: DC

## 2019-08-09 MED ORDER — POTASSIUM CHLORIDE CRYS ER 10 MEQ PO TBCR: 10.0000 meq | EXTENDED_RELEASE_TABLET | Freq: Every day | ORAL | 1 refills | Status: DC

## 2019-08-09 NOTE — Telephone Encounter (Signed)
E-scribed refill 

## 2019-08-09 NOTE — Telephone Encounter (Signed)
Pt is requesting refills on Enalapril, potassium chloride, hydrochlorothiazide, and Alendronate [facility] [address]

## 2019-08-10 ENCOUNTER — Telehealth: Payer: Self-pay

## 2019-08-10 NOTE — Telephone Encounter (Signed)
[facility] Night - Client Nonclinical Telephone Record AccessNurse Client [facility] Night - Client Client Site [place] Physician Ria Bush - MD Contact Type Call Who Is Calling Patient / Member / Family / Caregiver Caller Name Shailyn Hayenga Caller Phone Number [phone] Patient Name Maeola Holdridge Patient DOB 10/28/1940 Call Type Message Only Information Provided Reason for Call Request to Schedule Office Appointment Initial Comment Caller states she would like to make an appt Additional Comment Disp. Time Disposition Final User 08/10/2019 8:02:53 AM General Information Provided Yes Weston Anna Call Closed By: Weston Anna Transaction Date/Time: 08/10/2019 7:59:17 AM (ET)

## 2019-08-10 NOTE — Telephone Encounter (Signed)
Appointment 2/1

## 2019-08-14 ENCOUNTER — Ambulatory Visit (INDEPENDENT_AMBULATORY_CARE_PROVIDER_SITE_OTHER): Payer: Medicare HMO | Admitting: Family Medicine

## 2019-08-14 ENCOUNTER — Other Ambulatory Visit: Payer: Self-pay

## 2019-08-14 ENCOUNTER — Encounter: Payer: Self-pay | Admitting: Family Medicine

## 2019-08-14 DIAGNOSIS — M5432 Sciatica, left side: Secondary | ICD-10-CM | POA: Insufficient documentation

## 2019-08-14 NOTE — Patient Instructions (Addendum)
I think hip is looking ok today. I think you have sciatica on the left.  Do exercises provided today. Treat with topical anti inflammatory voltaren gel three times daily sent to pharmacy.  May also use heating pad to the area.

## 2019-08-14 NOTE — Assessment & Plan Note (Signed)
Anticipate L sided piriformis syndrome - provided with exercises from SM pt advisor as well as topical NSAIDs and heating pad. Discussed PT referral if not improving with treatment. Pt agrees with plan.

## 2019-08-14 NOTE — Progress Notes (Signed)
This visit was conducted in person.  BP 136/74 (BP Location: Left Arm, Patient Position: Sitting, Cuff Size: Small)   Pulse 80   Temp 97.7 F (36.5 C) (Temporal)   Ht 5' 1.5" (1.562 m)   Wt 100 lb 4.8 oz (45.5 kg)   SpO2 97%   BMI 18.64 kg/m    CC: L hip pain Subjective:    Patient ID: Margaret Nguyen, female    DOB: Nov 11, 1940, 79 y.o.   MRN: [id number]  HPI: Margaret Nguyen is a 79 y.o. female presenting on 08/14/2019 for Hip Pain (Left hip, x3 weeks, no injury)   3 wk h/o/ L hip pain without inciting falls or injury or trauma. Points to L buttock with pain radiating down entire thigh to below the knee. Pain affecting sleep. Leg can give out on her.   No fevers/chills.  Has been told has  L hip has dislocated twice in the past, she saw ortho to relocate this.  No ortho surgeries in the past.   Upcoming dental work - tooth extraction      Relevant past medical, surgical, family and social history reviewed and updated as indicated. Interim medical history since our last visit reviewed. Allergies and medications reviewed and updated. Outpatient Medications Prior to Visit  Medication Sig Dispense Refill  . acetaminophen (TYLENOL) 500 MG tablet Take 1 tablet (500 mg total) by mouth every 6 (six) hours as needed. 30 tablet 0  . alendronate (FOSAMAX) 70 MG tablet Take 1 tablet (70 mg total) by mouth every 7 (seven) days. Take with a full glass of water on an empty stomach. 12 tablet 3  . Calcium Carb-Cholecalciferol (CALCIUM 500 +D) 500-400 MG-UNIT TABS Take by mouth.      . Cholecalciferol (VITAMIN D3) 1000 UNITS CAPS Take 1 capsule by mouth daily.    . colchicine 0.6 MG tablet Take 1 tablet (0.6 mg total) by mouth daily as needed (foot/joint pain). 30 tablet 0  . enalapril (VASOTEC) 20 MG tablet Take 1 tablet (20 mg total) by mouth daily. 90 tablet 1  . ferrous sulfate 325 (65 FE) MG tablet Take 1 tablet (325 mg total) by mouth every Monday, Wednesday, and Friday.   3  . hydrochlorothiazide (HYDRODIURIL) 25 MG tablet TAKE 1 TABLET(25 MG) BY MOUTH EVERY MORNING 90 tablet 1  . Multiple Vitamins-Minerals (CENTRUM PO) Take by mouth.      . potassium chloride (KLOR-CON) 10 MEQ tablet Take 1 tablet (10 mEq total) by mouth daily. 90 tablet 1  . vitamin B-12 (CYANOCOBALAMIN) 500 MCG tablet Take 1 tablet (500 mcg total) daily by mouth. (Patient taking differently: Take 500 mcg by mouth 2 (two) times a week. Takes 1/2 tablet)    . vitamin C (ASCORBIC ACID) 500 MG tablet Take 500 mg by mouth daily.     No facility-administered medications prior to visit.     Per HPI unless specifically indicated in ROS section below Review of Systems Objective:    BP 136/74 (BP Location: Left Arm, Patient Position: Sitting, Cuff Size: Small)   Pulse 80   Temp 97.7 F (36.5 C) (Temporal)   Ht 5' 1.5" (1.562 m)   Wt 100 lb 4.8 oz (45.5 kg)   SpO2 97%   BMI 18.64 kg/m   Wt Readings from Last 3 Encounters:  08/14/19 100 lb 4.8 oz (45.5 kg)  04/08/19 100 lb (45.4 kg)  03/27/19 100 lb 12.8 oz (45.7 kg)    Physical Exam Vitals  and nursing note reviewed.  Constitutional:      Appearance: Normal appearance. She is not ill-appearing.  Musculoskeletal:        General: Normal range of motion.     Right lower leg: No edema.     Left lower leg: No edema.     Comments:  No pain midline spine No paraspinous mm tenderness Neg SLR bilaterally. No pain with int/ext rotation at hip. Neg FABER. No pain at SIJ, GTB bilaterally.  ++ pain at L sciatic notch  Skin:    General: Skin is warm and dry.     Findings: No rash.  Neurological:     General: No focal deficit present.     Mental Status: She is alert.     Comments: 5/5 strength BLE  Psychiatric:        Mood and Affect: Mood normal.       Results for orders placed or performed in visit on 03/16/19  HM MAMMOGRAPHY  Result Value Ref Range   HM Mammogram 0-4 Bi-Rad 0-4 Bi-Rad, Self Reported Normal   Assessment & Plan:    This visit occurred during the SARS-CoV-2 public health emergency.  Safety protocols were in place, including screening questions prior to the visit, additional usage of staff PPE, and extensive cleaning of exam room while observing appropriate contact time as indicated for disinfecting solutions.  Asks about covid vaccine - I signed pt up for waiting list through [facility]. Problem List Items Addressed This Visit    Left sided sciatica    Anticipate L sided piriformis syndrome - provided with exercises from SM pt advisor as well as topical NSAIDs and heating pad. Discussed PT referral if not improving with treatment. Pt agrees with plan.           No orders of the defined types were placed in this encounter.  No orders of the defined types were placed in this encounter.  Patient Instructions  I think hip is looking ok today. I think you have sciatica on the left.  Do exercises provided today. Treat with topical anti inflammatory voltaren gel three times daily sent to pharmacy.  May also use heating pad to the area.     Follow up plan: Return if symptoms worsen or fail to improve.  Ria Bush, MD

## 2019-09-05 ENCOUNTER — Telehealth: Payer: Self-pay | Admitting: Family Medicine

## 2019-09-05 NOTE — Telephone Encounter (Signed)
Medical Clearance form given to Dr. Darnell Level.

## 2019-09-05 NOTE — Telephone Encounter (Signed)
Received request from [facility] dental clinic about tooth extractions for patient.  Form filled and in Lisa's box.

## 2019-09-05 NOTE — Telephone Encounter (Signed)
Margaret Nguyen (DPR signed) left v/m that pt is at dentist office to get teeth pulled and dentist office advised pt that dentist office had called several times and dentist office had faxed paperwork to be filled out. [name] wants to know if papers will be signed today so pt can get her teeth pulled. Lisa request cb. This is only call I see about dental appt in pts chart review and do not see any paperwork from dentist office under media tab. Please advise. Will FYI to [facility] CMA and [employer].

## 2019-09-05 NOTE — Telephone Encounter (Signed)
Today was the first day that dentist office contacted [place] as far as I can tell.  I was unable to fill out forms earlier as I was dealing with a patient care crisis this afternoon. I have filled out form - and placed in Lisa's box.

## 2019-09-06 NOTE — Telephone Encounter (Signed)
Noted.  See TE, 08/04/19.

## 2019-09-06 NOTE — Telephone Encounter (Signed)
Faxed form first thing this morning.

## 2019-09-07 ENCOUNTER — Ambulatory Visit: Payer: Self-pay

## 2019-09-07 NOTE — Telephone Encounter (Signed)
Patient called asking if it is ok for her to have her 1st COVID-19 vaccine on 3/1 and have teeth extracted on 09/12/19 and 09/14/19.  She was told that she need to contact her PCP and surgeon for their advice.  She states that she will need to be on antibiotics after her extractions. She states that she will call her PCP and surgeon for their recommendations.  She will call back if they suggest that she reschedule her vaccine.  Reason for Disposition . [1] Caller requesting NON-URGENT health information AND [2] PCP's office is the best resource  Answer Assessment - Initial Assessment Questions 1. REASON FOR CALL or QUESTION: "What is your reason for calling today?" or "How can I best help you?" or "What question do you have that I can help answer?"     Patient called asking if it is ok for her to have her 1st COVID-19 vaccine on 3/1 and have teeth extracted on 09/12/19 and 09/14/19.  Protocols used: INFORMATION ONLY CALL - NO TRIAGE-A-AH

## 2019-09-08 MED ORDER — AMOXICILLIN 500 MG PO CAPS: 2000.0000 mg | ORAL_CAPSULE | Freq: Every day | ORAL | 0 refills | Status: DC | PRN

## 2019-09-08 NOTE — Addendum Note (Signed)
Addended by: Ria Bush on: 09/08/2019 05:54 PM   Modules accepted: Orders

## 2019-09-08 NOTE — Telephone Encounter (Signed)
Amoxicillin sent in

## 2019-09-08 NOTE — Telephone Encounter (Signed)
Patient states she is needing two rounds of antibiotics prior to her dental extractions next Tuesday and Thursday. [facility] [address]  Dr. Darnell Level, please advise.

## 2019-09-08 NOTE — Telephone Encounter (Signed)
[facility] Night - Client Nonclinical Telephone Record AccessNurse Client [facility] Night - Client Client Site [place] Physician Ria Bush - MD Contact Type Call Who Is Calling Patient / Member / Family / Caregiver Caller Name Becker Phone Number [phone] Patient Name Margaret Nguyen Patient DOB [birth year] Call Type Message Only Information Provided Reason for Call Request for General Office Information Initial Comment Caller states she is getting teeth pulled at the same time the vaccine is being done. Can she do both? Additional Comment Disp. Time Disposition Final User 09/07/2019 5:19:01 PM General Information Provided Yes Jowers, April Call Closed By: April Jowers Transaction Date/Time: 09/07/2019 5:16:11 PM (ET)

## 2019-09-08 NOTE — Telephone Encounter (Addendum)
Would recommend she reschedule covid shot for 1 week after dental work just to be safe.

## 2019-09-11 ENCOUNTER — Ambulatory Visit: Payer: Medicare HMO

## 2019-09-11 NOTE — Telephone Encounter (Signed)
Spoke with pt notifying her abx sent in.  Expresses her thanks.

## 2019-09-11 NOTE — Telephone Encounter (Signed)
Spoke with pt relaying Dr. Synthia Innocent message.  Verbalizes understanding and will r/s vaccine.

## 2019-09-17 ENCOUNTER — Ambulatory Visit: Payer: Medicare HMO

## 2019-11-09 ENCOUNTER — Telehealth: Payer: Self-pay

## 2019-11-09 NOTE — Telephone Encounter (Signed)
Pt has a dental visit tomorrow. Not aure that they will be doing anything for the problem she has, but would like the refill of amoxicillin pre-med sent in to [facility] just in case. If there are any issues, please call pt.

## 2019-11-10 MED ORDER — AMOXICILLIN 500 MG PO CAPS: 2000.0000 mg | ORAL_CAPSULE | Freq: Every day | ORAL | 0 refills | Status: DC | PRN

## 2019-11-10 NOTE — Telephone Encounter (Signed)
Pt called back and cannot have dental work until she has the pre-medication.

## 2019-11-10 NOTE — Telephone Encounter (Signed)
H/o rheumatic fever as child with murmur However I'm not clear she needs abx due to this alone (not according to current guidelines). Regardless will refill.

## 2019-11-13 NOTE — Telephone Encounter (Signed)
Spoke with pt relaying Dr. Synthia Innocent message.  Verbalizes understanding and expresses her thanks.

## 2020-01-26 ENCOUNTER — Other Ambulatory Visit: Payer: Self-pay

## 2020-01-26 ENCOUNTER — Other Ambulatory Visit: Payer: Self-pay | Admitting: Family Medicine

## 2020-01-26 ENCOUNTER — Other Ambulatory Visit (INDEPENDENT_AMBULATORY_CARE_PROVIDER_SITE_OTHER): Payer: Medicare HMO

## 2020-01-26 DIAGNOSIS — Z1159 Encounter for screening for other viral diseases: Secondary | ICD-10-CM | POA: Diagnosis not present

## 2020-01-26 DIAGNOSIS — N289 Disorder of kidney and ureter, unspecified: Secondary | ICD-10-CM

## 2020-01-26 DIAGNOSIS — M810 Age-related osteoporosis without current pathological fracture: Secondary | ICD-10-CM

## 2020-01-26 DIAGNOSIS — D509 Iron deficiency anemia, unspecified: Secondary | ICD-10-CM

## 2020-01-26 DIAGNOSIS — E538 Deficiency of other specified B group vitamins: Secondary | ICD-10-CM

## 2020-01-26 DIAGNOSIS — E785 Hyperlipidemia, unspecified: Secondary | ICD-10-CM

## 2020-01-26 LAB — CBC WITH DIFFERENTIAL/PLATELET
Basophils Absolute: 0 10*3/uL (ref 0.0–0.1)
Basophils Relative: 0.9 % (ref 0.0–3.0)
Eosinophils Absolute: 0.1 10*3/uL (ref 0.0–0.7)
Eosinophils Relative: 1.4 % (ref 0.0–5.0)
HCT: 32.4 % — ABNORMAL LOW (ref 36.0–46.0)
Hemoglobin: 10.6 g/dL — ABNORMAL LOW (ref 12.0–15.0)
Lymphocytes Relative: 16.7 % (ref 12.0–46.0)
Lymphs Abs: 0.8 10*3/uL (ref 0.7–4.0)
MCHC: 32.7 g/dL (ref 30.0–36.0)
MCV: 93.8 fl (ref 78.0–100.0)
Monocytes Absolute: 0.8 10*3/uL (ref 0.1–1.0)
Monocytes Relative: 16.4 % — ABNORMAL HIGH (ref 3.0–12.0)
Neutro Abs: 3 10*3/uL (ref 1.4–7.7)
Neutrophils Relative %: 64.6 % (ref 43.0–77.0)
Platelets: 258 10*3/uL (ref 150.0–400.0)
RBC: 3.45 Mil/uL — ABNORMAL LOW (ref 3.87–5.11)
RDW: 12.5 % (ref 11.5–15.5)
WBC: 4.6 10*3/uL (ref 4.0–10.5)

## 2020-01-26 LAB — VITAMIN B12: Vitamin B-12: 530 pg/mL (ref 211–911)

## 2020-01-26 LAB — LIPID PANEL
Cholesterol: 160 mg/dL (ref 0–200)
HDL: 80.9 mg/dL (ref >39.00)
LDL Cholesterol: 53 mg/dL (ref 0–99)
NonHDL: 78.98
Total CHOL/HDL Ratio: 2
Triglycerides: 132 mg/dL (ref 0.0–149.0)
VLDL: 26.4 mg/dL (ref 0.0–40.0)

## 2020-01-26 LAB — COMPREHENSIVE METABOLIC PANEL
ALT: 15 U/L (ref 0–35)
AST: 23 U/L (ref 0–37)
Albumin: 4.3 g/dL (ref 3.5–5.2)
Alkaline Phosphatase: 59 U/L (ref 39–117)
BUN: 21 mg/dL (ref 6–23)
CO2: 28 mEq/L (ref 19–32)
Calcium: 9.8 mg/dL (ref 8.4–10.5)
Chloride: 95 mEq/L — ABNORMAL LOW (ref 96–112)
Creatinine, Ser: 1 mg/dL (ref 0.40–1.20)
GFR: 64.72 mL/min (ref >60.00)
Glucose, Bld: 127 mg/dL — ABNORMAL HIGH (ref 70–99)
Potassium: 4.7 mEq/L (ref 3.5–5.1)
Sodium: 132 mEq/L — ABNORMAL LOW (ref 135–145)
Total Bilirubin: 0.5 mg/dL (ref 0.2–1.2)
Total Protein: 7.1 g/dL (ref 6.0–8.3)

## 2020-01-26 LAB — IBC PANEL
Iron: 99 ug/dL (ref 42–145)
Saturation Ratios: 29.7 % (ref 20.0–50.0)
Transferrin: 238 mg/dL (ref 212.0–360.0)

## 2020-01-26 LAB — FERRITIN: Ferritin: 419.8 ng/mL — ABNORMAL HIGH (ref 10.0–291.0)

## 2020-01-26 LAB — VITAMIN D 25 HYDROXY (VIT D DEFICIENCY, FRACTURES): VITD: 95.43 ng/mL (ref 30.00–100.00)

## 2020-01-26 LAB — TSH: TSH: 4.74 u[IU]/mL — ABNORMAL HIGH (ref 0.35–4.50)

## 2020-01-29 LAB — HEPATITIS C ANTIBODY
Hepatitis C Ab: NONREACTIVE
SIGNAL TO CUT-OFF: 0.5 (ref <1.00)

## 2020-01-30 ENCOUNTER — Ambulatory Visit: Payer: Medicare Other

## 2020-01-30 ENCOUNTER — Other Ambulatory Visit: Payer: Self-pay | Admitting: Family Medicine

## 2020-02-02 ENCOUNTER — Ambulatory Visit (INDEPENDENT_AMBULATORY_CARE_PROVIDER_SITE_OTHER): Payer: Medicare HMO | Admitting: Family Medicine

## 2020-02-02 ENCOUNTER — Encounter: Payer: Self-pay | Admitting: Family Medicine

## 2020-02-02 ENCOUNTER — Other Ambulatory Visit: Payer: Self-pay

## 2020-02-02 VITALS — BP 130/70 | HR 81 | Temp 97.5°F | Ht 60.0 in | Wt 88.2 lb

## 2020-02-02 DIAGNOSIS — I10 Essential (primary) hypertension: Secondary | ICD-10-CM

## 2020-02-02 DIAGNOSIS — D649 Anemia, unspecified: Secondary | ICD-10-CM

## 2020-02-02 DIAGNOSIS — E538 Deficiency of other specified B group vitamins: Secondary | ICD-10-CM

## 2020-02-02 DIAGNOSIS — E785 Hyperlipidemia, unspecified: Secondary | ICD-10-CM

## 2020-02-02 DIAGNOSIS — N289 Disorder of kidney and ureter, unspecified: Secondary | ICD-10-CM

## 2020-02-02 DIAGNOSIS — Z7189 Other specified counseling: Secondary | ICD-10-CM

## 2020-02-02 DIAGNOSIS — R634 Abnormal weight loss: Secondary | ICD-10-CM | POA: Diagnosis not present

## 2020-02-02 DIAGNOSIS — Z0000 Encounter for general adult medical examination without abnormal findings: Secondary | ICD-10-CM

## 2020-02-02 DIAGNOSIS — M810 Age-related osteoporosis without current pathological fracture: Secondary | ICD-10-CM

## 2020-02-02 NOTE — Assessment & Plan Note (Signed)

## 2020-02-02 NOTE — Assessment & Plan Note (Addendum)
Fosamax causing cramping. Cannot afford prolia. Did not tolerate IV bisphosphonate. Encouraged continued cal/vit D and walking.  Consider estrogen vs evista.

## 2020-02-02 NOTE — Assessment & Plan Note (Signed)
Chronic, stable. Continue current regimen. 

## 2020-02-02 NOTE — Assessment & Plan Note (Addendum)
Continue oral replacement.  Levels releted

## 2020-02-02 NOTE — Assessment & Plan Note (Signed)
Again noted today - attributes to recent dental issues (new partial dentures in setting of gingivitis) Reviewed dietary recommendations to ensure good nutritional intake, rec continue 2 ensure a day.  RTC 6 mo f/u visit.

## 2020-02-02 NOTE — Assessment & Plan Note (Signed)
This is improved today.

## 2020-02-02 NOTE — Patient Instructions (Addendum)
Work on setting up living will, advanced directive, bring me a copy at your convenience.  Iron stores were high, iron levels were normal - ok to stop daily iron.  You are doing well today but noted weight loss.  Continue 2 Ensure a day.  Work on daily nutrition - soft mechanical diet (bananas, rice, apple sauce, chicken broth, tuna) while teeth improve.  Return in 6 months for office visit and labs.   Health Maintenance After Age 79 After age 69, you are at a higher risk for certain long-term diseases and infections as well as injuries from falls. Falls are a major cause of broken bones and head injuries in people who are older than age 85. Getting regular preventive care can help to keep you healthy and well. Preventive care includes getting regular testing and making lifestyle changes as recommended by your health care provider. Talk with your health care provider about:  Which screenings and tests you should have. A screening is a test that checks for a disease when you have no symptoms.  A diet and exercise plan that is right for you. What should I know about screenings and tests to prevent falls? Screening and testing are the best ways to find a health problem early. Early diagnosis and treatment give you the best chance of managing medical conditions that are common after age 94. Certain conditions and lifestyle choices may make you more likely to have a fall. Your health care provider may recommend:  Regular vision checks. Poor vision and conditions such as cataracts can make you more likely to have a fall. If you wear glasses, make sure to get your prescription updated if your vision changes.  Medicine review. Work with your health care provider to regularly review all of the medicines you are taking, including over-the-counter medicines. Ask your health care provider about any side effects that may make you more likely to have a fall. Tell your health care provider if any medicines that  you take make you feel dizzy or sleepy.  Osteoporosis screening. Osteoporosis is a condition that causes the bones to get weaker. This can make the bones weak and cause them to break more easily.  Blood pressure screening. Blood pressure changes and medicines to control blood pressure can make you feel dizzy.  Strength and balance checks. Your health care provider may recommend certain tests to check your strength and balance while standing, walking, or changing positions.  Foot health exam. Foot pain and numbness, as well as not wearing proper footwear, can make you more likely to have a fall.  Depression screening. You may be more likely to have a fall if you have a fear of falling, feel emotionally low, or feel unable to do activities that you used to do.  Alcohol use screening. Using too much alcohol can affect your balance and may make you more likely to have a fall. What actions can I take to lower my risk of falls? General instructions  Talk with your health care provider about your risks for falling. Tell your health care provider if: ? You fall. Be sure to tell your health care provider about all falls, even ones that seem minor. ? You feel dizzy, sleepy, or off-balance.  Take over-the-counter and prescription medicines only as told by your health care provider. These include any supplements.  Eat a healthy diet and maintain a healthy weight. A healthy diet includes low-fat dairy products, low-fat (lean) meats, and fiber from whole grains, beans, and  lots of fruits and vegetables. Home safety  Remove any tripping hazards, such as rugs, cords, and clutter.  Install safety equipment such as grab bars in bathrooms and safety rails on stairs.  Keep rooms and walkways well-lit. Activity   Follow a regular exercise program to stay fit. This will help you maintain your balance. Ask your health care provider what types of exercise are appropriate for you.  If you need a cane or  walker, use it as recommended by your health care provider.  Wear supportive shoes that have nonskid soles. Lifestyle  Do not drink alcohol if your health care provider tells you not to drink.  If you drink alcohol, limit how much you have: ? 0-1 drink a day for women. ? 0-2 drinks a day for men.  Be aware of how much alcohol is in your drink. In the [place] one drink equals one typical bottle of beer (12 oz), one-half glass of wine (5 oz), or one shot of hard liquor (1 oz).  Do not use any products that contain nicotine or tobacco, such as cigarettes and e-cigarettes. If you need help quitting, ask your health care provider. Summary  Having a healthy lifestyle and getting preventive care can help to protect your health and wellness after age 48.  Screening and testing are the best way to find a health problem early and help you avoid having a fall. Early diagnosis and treatment give you the best chance for managing medical conditions that are more common for people who are older than age 69.  Falls are a major cause of broken bones and head injuries in people who are older than age 61. Take precautions to prevent a fall at home.  Work with your health care provider to learn what changes you can make to improve your health and wellness and to prevent falls. This information is not intended to replace advice given to you by your health care provider. Make sure you discuss any questions you have with your health care provider. Document Revised: 10/20/2018 Document Reviewed: 05/12/2017 Elsevier Patient Education  2020 [employer].

## 2020-02-02 NOTE — Progress Notes (Signed)
This visit was conducted in person.  BP (!) 130/70 (BP Location: Left Arm, Patient Position: Sitting, Cuff Size: Normal)   Pulse 81   Temp (!) 97.5 F (36.4 C) (Temporal)   Ht 5' (1.524 m)   Wt (!) 88 lb 4 oz (40 kg)   SpO2 96%   BMI 17.24 kg/m     CC: AMW Subjective:    Patient ID: Margaret Nguyen, female    DOB: 1941/04/04, 79 y.o.   MRN: [id number]  HPI: Margaret Nguyen is a 79 y.o. female presenting on 02/02/2020 for Medicare Wellness (Wants to discuss Fosamax.) and Weight Loss (Aware of wt loss.  States due to poor dental health. )   Did not see health advisor this year .   Hearing Screening   125Hz  250Hz  500Hz  1000Hz  2000Hz  3000Hz  4000Hz  6000Hz  8000Hz   Right ear:   25 40 40  40    Left ear:   25 0 40  40      Visual Acuity Screening   Right eye Left eye Both eyes  Without correction:     With correction: 20/50 20/40 20/40       Office Visit from 02/02/2020 in [place] [facility] [place]  PHQ-2 Total Score 2      Fall Risk  02/02/2020 01/27/2019 01/12/2018 11/09/2016 09/09/2015  Falls in the past year? 0 1 No No No  Number falls in past yr: - 0 - - -  Injury with Fall? - 1 - - -    12 lb weight loss. Having dental trouble - has partial dentures, gingivitis - having trouble eating - attributes weight loss to this. She is drinking 2 ensure a day. Struggling with nutrition.   OP - DEXA 03/2019 T score -3. Fosamax causing cramping so she wants to stop.   Preventative: COLONOSCOPY Date: 04/2014 mult TA, one with high grade dysplasia, mod diverticulosis, rpt 3 yrs [facility])  COLONOSCOPY 03/2019 - 1 TA, diverticulosis, no f/u planned [facility]) Breast cancer screening - mammo 03/2019 WNL Well woman exam - s/p partial hysterectomy. Ovaries remain.Denies pelvic pain or pressure or vaginal bleeding. Lung cancer screening - not candidate  DEXA latest 03/2019 - see above. Oral bisphosphonate causes cramping, unable to afford prolia.ReceivedIV reclast 12/03/2017  - increased cramping with this, does not want to repeat injection. Less walking recently due to malaise.  Flu shotyearly  Pneumovax 02/2006, prevnar2/2017 Tdap 01/2011 COVID vaccine - completed [place] series 10/2019  Shingles shot - declines due to financial reasons Advanced directive discussion - Discussed. Advanced directive packet provided today.She has talked with family. Daughter RN would be HCPOA. Doesn't want prolonged life support if terminal condition. Seat belt use discussed  Sunscreen use discussed, no changing moles on skin. Non smoker  Alcohol - some wine or liquor but not daily  Dentist - seeing regularly  Eye exam overdue  Bowel - no constipation  Bladder - no incontinence   Married [phone], widowed 1 son, 2 daughters, 1 son deceased 5 grandchildren Lives alone; I- ADLs Retired, used to work [employer] Activity: some walking Diet: good water, fruits/vegetables daily     Relevant past medical, surgical, family and social history reviewed and updated as indicated. Interim medical history since our last visit reviewed. Allergies and medications reviewed and updated. Outpatient Medications Prior to Visit  Medication Sig Dispense Refill  . acetaminophen (TYLENOL) 500 MG tablet Take 1 tablet (500 mg total) by mouth every 6 (six) hours as needed. 30 tablet 0  .  amoxicillin (AMOXIL) 500 MG capsule Take 4 capsules (2,000 mg total) by mouth daily as needed (prior to dental work). 8 capsule 0  . Calcium Carb-Cholecalciferol (CALCIUM 500 +D) 500-400 MG-UNIT TABS Take by mouth.      . Cholecalciferol (VITAMIN D3) 1000 UNITS CAPS Take 1 capsule by mouth daily.    . colchicine 0.6 MG tablet Take 1 tablet (0.6 mg total) by mouth daily as needed (foot/joint pain). 30 tablet 0  . enalapril (VASOTEC) 20 MG tablet TAKE 1 TABLET BY MOUTH EVERY DAY 90 tablet 1  . hydrochlorothiazide (HYDRODIURIL) 25 MG tablet TAKE 1 TABLET(25 MG) BY MOUTH EVERY MORNING 90 tablet 1  . Multiple  Vitamins-Minerals (CENTRUM PO) Take by mouth.      . potassium chloride (KLOR-CON) 10 MEQ tablet TAKE 1 TABLET BY MOUTH EVERY DAY 90 tablet 1  . vitamin B-12 (CYANOCOBALAMIN) 500 MCG tablet Take 1 tablet (500 mcg total) daily by mouth. (Patient taking differently: Take 500 mcg by mouth 2 (two) times a week. Takes 1/2 tablet)    . vitamin C (ASCORBIC ACID) 500 MG tablet Take 500 mg by mouth daily.    . ferrous sulfate 325 (65 FE) MG tablet Take 1 tablet (325 mg total) by mouth every Monday, Wednesday, and Friday.  3  . alendronate (FOSAMAX) 70 MG tablet Take 1 tablet (70 mg total) by mouth every 7 (seven) days. Take with a full glass of water on an empty stomach. (Patient not taking: Reported on 02/02/2020) 12 tablet 3   No facility-administered medications prior to visit.     Per HPI unless specifically indicated in ROS section below Review of Systems Objective:  BP (!) 130/70 (BP Location: Left Arm, Patient Position: Sitting, Cuff Size: Normal)   Pulse 81   Temp (!) 97.5 F (36.4 C) (Temporal)   Ht 5' (1.524 m)   Wt (!) 88 lb 4 oz (40 kg)   SpO2 96%   BMI 17.24 kg/m   Wt Readings from Last 3 Encounters:  02/02/20 (!) 88 lb 4 oz (40 kg)  08/14/19 100 lb 4.8 oz (45.5 kg)  04/08/19 100 lb (45.4 kg)      Physical Exam Vitals and nursing note reviewed.  Constitutional:      General: She is not in acute distress.    Appearance: Normal appearance. She is well-developed. She is not ill-appearing.  HENT:     Head: Normocephalic and atraumatic.     Right Ear: Hearing, tympanic membrane, ear canal and external ear normal.     Left Ear: Hearing, tympanic membrane, ear canal and external ear normal.     Mouth/Throat:     Pharynx: Uvula midline.  Eyes:     General: No scleral icterus.    Extraocular Movements: Extraocular movements intact.     Conjunctiva/sclera: Conjunctivae normal.     Pupils: Pupils are equal, round, and reactive to light.  Cardiovascular:     Rate and Rhythm:  Normal rate and regular rhythm.     Pulses: Normal pulses.          Radial pulses are 2+ on the right side and 2+ on the left side.     Heart sounds: Normal heart sounds. No murmur heard.   Pulmonary:     Effort: Pulmonary effort is normal. No respiratory distress.     Breath sounds: Normal breath sounds. No wheezing, rhonchi or rales.  Abdominal:     General: Abdomen is flat. Bowel sounds are normal. There is no  distension.     Palpations: Abdomen is soft. There is no mass.     Tenderness: There is no abdominal tenderness. There is no guarding or rebound.     Hernia: No hernia is present.  Musculoskeletal:        General: Normal range of motion.     Cervical back: Normal range of motion and neck supple.  Lymphadenopathy:     Cervical: No cervical adenopathy.  Skin:    General: Skin is warm and dry.     Findings: No rash.  Neurological:     General: No focal deficit present.     Mental Status: She is alert and oriented to person, place, and time.     Comments:  CN grossly intact, station and gait intact Recall 2/3, 3/3 with cue Calculation 5/5 DLROW  Psychiatric:        Mood and Affect: Mood normal.        Behavior: Behavior normal.        Thought Content: Thought content normal.        Judgment: Judgment normal.       Results for orders placed or performed in visit on 01/26/20  Hepatitis C antibody  Result Value Ref Range   Hepatitis C Ab NON-REACTIVE NON-REACTI   SIGNAL TO CUT-OFF 0.50 <1.00  CBC with Differential/Platelet  Result Value Ref Range   WBC 4.6 4.0 - 10.5 K/uL   RBC 3.45 (L) 3.87 - 5.11 Mil/uL   Hemoglobin 10.6 (L) 12.0 - 15.0 g/dL   HCT 32.4 (L) 36 - 46 %   MCV 93.8 78.0 - 100.0 fl   MCHC 32.7 30.0 - 36.0 g/dL   RDW 12.5 11.5 - 15.5 %   Platelets 258.0 150 - 400 K/uL   Neutrophils Relative % 64.6 43 - 77 %   Lymphocytes Relative 16.7 12 - 46 %   Monocytes Relative 16.4 (H) 3 - 12 %   Eosinophils Relative 1.4 0 - 5 %   Basophils Relative 0.9 0 - 3  %   Neutro Abs 3.0 1.4 - 7.7 K/uL   Lymphs Abs 0.8 0.7 - 4.0 K/uL   Monocytes Absolute 0.8 0 - 1 K/uL   Eosinophils Absolute 0.1 0 - 0 K/uL   Basophils Absolute 0.0 0 - 0 K/uL  TSH  Result Value Ref Range   TSH 4.74 (H) 0.35 - 4.50 uIU/mL  Comprehensive metabolic panel  Result Value Ref Range   Sodium 132 (L) 135 - 145 mEq/L   Potassium 4.7 3.5 - 5.1 mEq/L   Chloride 95 (L) 96 - 112 mEq/L   CO2 28 19 - 32 mEq/L   Glucose, Bld 127 (H) 70 - 99 mg/dL   BUN 21 6 - 23 mg/dL   Creatinine, Ser 1.00 0.40 - 1.20 mg/dL   Total Bilirubin 0.5 0.2 - 1.2 mg/dL   Alkaline Phosphatase 59 39 - 117 U/L   AST 23 0 - 37 U/L   ALT 15 0 - 35 U/L   Total Protein 7.1 6.0 - 8.3 g/dL   Albumin 4.3 3.5 - 5.2 g/dL   GFR 64.72 >60.00 mL/min   Calcium 9.8 8.4 - 10.5 mg/dL  Lipid panel  Result Value Ref Range   Cholesterol 160 0 - 200 mg/dL   Triglycerides 132.0 0 - 149 mg/dL   HDL 80.90 >39.00 mg/dL   VLDL 26.4 0.0 - 40.0 mg/dL   LDL Cholesterol 53 0 - 99 mg/dL   Total CHOL/HDL Ratio 2  NonHDL 78.98   VITAMIN D 25 Hydroxy (Vit-D Deficiency, Fractures)  Result Value Ref Range   VITD 95.43 30.00 - 100.00 ng/mL  IBC panel  Result Value Ref Range   Iron 99 42 - 145 ug/dL   Transferrin 238.0 212.0 - 360.0 mg/dL   Saturation Ratios 29.7 20.0 - 50.0 %  Ferritin  Result Value Ref Range   Ferritin 419.8 (H) 10.0 - 291.0 ng/mL  Vitamin B12  Result Value Ref Range   Vitamin B-12 530 211 - 911 pg/mL   Depression screen [facility] 2/9 02/02/2020 01/27/2019 01/12/2018 11/09/2016 09/09/2015  Decreased Interest 1 0 0 0 0  Down, Depressed, Hopeless 1 0 0 0 0  PHQ - 2 Score 2 0 0 0 0  Altered sleeping 2 - 0 - -  Tired, decreased energy 3 - 0 - -  Change in appetite 3 - 0 - -  Feeling bad or failure about yourself  2 - 0 - -  Trouble concentrating 0 - 0 - -  Moving slowly or fidgety/restless 1 - 0 - -  Suicidal thoughts 0 - 0 - -  PHQ-9 Score 13 - 0 - -    GAD 7 : Generalized Anxiety Score 02/02/2020  Nervous,  Anxious, on Edge 2  Control/stop worrying 1  Worry too much - different things 1  Trouble relaxing 1  Restless 1  Easily annoyed or irritable 1  Afraid - awful might happen 1  Total GAD 7 Score 8    Assessment & Plan:  This visit occurred during the SARS-CoV-2 public health emergency.  Safety protocols were in place, including screening questions prior to the visit, additional usage of staff PPE, and extensive cleaning of exam room while observing appropriate contact time as indicated for disinfecting solutions.   Problem List Items Addressed This Visit    Weight loss    Again noted today - attributes to recent dental issues (new partial dentures in setting of gingivitis) Reviewed dietary recommendations to ensure good nutritional intake, rec continue 2 ensure a day.  RTC 6 mo f/u visit.       Vitamin B12 deficiency    Continue oral replacement.  Levels releted       Renal insufficiency    This is improved today.       Osteoporosis    Fosamax causing cramping. Cannot afford prolia. Did not tolerate IV bisphosphonate. Encouraged continued cal/vit D and walking.  Consider estrogen vs evista.       Medicare annual wellness visit, subsequent - Primary    I have personally reviewed the Medicare Annual Wellness questionnaire and have noted 1. The patient's medical and social history 2. Their use of alcohol, tobacco or illicit drugs 3. Their current medications and supplements 4. The patient's functional ability including ADL's, fall risks, home safety risks and hearing or visual impairment. Cognitive function has been assessed and addressed as indicated.  5. Diet and physical activity 6. Evidence for depression or mood disorders The patients weight, height, BMI have been recorded in the chart. I have made referrals, counseling and provided education to the patient based on review of the above and I have provided the pt with a written personalized care plan for preventive  services. Provider list updated.. See scanned questionairre as needed for further documentation. Reviewed preventative protocols and updated unless pt declined.       Essential hypertension    Chronic, stable. Continue current regimen.       Dyslipidemia  Chronic improved with weight loss The 10-year ASCVD risk score Mikey Bussing DC Jr., et al., 2013) is: 18.1%   Values used to calculate the score:     Age: 67 years     Sex: Female     Is Non-Hispanic African American: Yes     Diabetic: No     Tobacco smoker: No     Systolic Blood Pressure: 014 mmHg     Is BP treated: Yes     HDL Cholesterol: 80.9 mg/dL     Total Cholesterol: 160 mg/dL       Anemia, unspecified    Anemia persists, iron levels now normal. Will stop oral iron.       Advanced care planning/counseling discussion    Advanced directive discussion - Discussed. Advanced directive packet provided today.She has talked with family. Daughter RN would be HCPOA. Doesn't want prolonged life support if terminal condition.          No orders of the defined types were placed in this encounter.  No orders of the defined types were placed in this encounter.   Patient instructions: Work on setting up living will, advanced directive, bring me a copy at your convenience.  Iron stores were high, iron levels were normal - ok to stop daily iron.  You are doing well today but noted weight loss.  Continue 2 Ensure a day.  Work on daily nutrition - soft mechanical diet (bananas, rice, apple sauce, chicken broth, tuna) while teeth improve.  Return in 6 months for office visit and labs.   Follow up plan: Return in about 6 months (around 08/04/2020) for follow up visit.  Ria Bush, MD

## 2020-02-02 NOTE — Assessment & Plan Note (Signed)
Anemia persists, iron levels now normal. Will stop oral iron.

## 2020-02-02 NOTE — Assessment & Plan Note (Addendum)
Chronic improved with weight loss The 10-year ASCVD risk score Mikey Bussing DC Jr., et al., 2013) is: 18.1%   Values used to calculate the score:     Age: 79 years     Sex: Female     Is Non-Hispanic African American: Yes     Diabetic: No     Tobacco smoker: No     Systolic Blood Pressure: 786 mmHg     Is BP treated: Yes     HDL Cholesterol: 80.9 mg/dL     Total Cholesterol: 160 mg/dL

## 2020-02-02 NOTE — Assessment & Plan Note (Signed)
Advanced directive discussion - Discussed. Advanced directive packet provided today.She has talked with family. Daughter RN would be HCPOA. Doesn't want prolonged life support if terminal condition.

## 2020-02-04 ENCOUNTER — Other Ambulatory Visit: Payer: Self-pay | Admitting: Family Medicine

## 2020-02-05 ENCOUNTER — Telehealth: Payer: Self-pay | Admitting: *Deleted

## 2020-02-05 NOTE — Telephone Encounter (Signed)
Patient left a voicemail stating that she was recently in for her physical and was asked about her living will. Patient stated that she can not find it and would like the paperwork to complete it again.

## 2020-02-06 NOTE — Telephone Encounter (Signed)
Living will packet is prepared and will get mailed out tomorrow 02/06/20.

## 2020-08-01 ENCOUNTER — Other Ambulatory Visit: Payer: Self-pay | Admitting: Family Medicine

## 2020-08-01 DIAGNOSIS — R946 Abnormal results of thyroid function studies: Secondary | ICD-10-CM

## 2020-08-01 DIAGNOSIS — E538 Deficiency of other specified B group vitamins: Secondary | ICD-10-CM

## 2020-08-01 DIAGNOSIS — D649 Anemia, unspecified: Secondary | ICD-10-CM

## 2020-08-01 DIAGNOSIS — N289 Disorder of kidney and ureter, unspecified: Secondary | ICD-10-CM

## 2020-08-01 DIAGNOSIS — R739 Hyperglycemia, unspecified: Secondary | ICD-10-CM

## 2020-08-01 NOTE — Telephone Encounter (Signed)
Pharmacy requests refill on: Enalapril 20 mg   LAST REFILL: 01/30/2020 (Q-90, R-1) LAST OV: 02/02/2020 NEXT OV: 08/07/2020 PHARMACY: [facility] [place] requests refill on: Potassium Chloride 10 mEq  LAST REFILL: 01/30/2020 (Q-90, R-1) LAST OV: 02/02/2020 NEXT OV: 08/07/2020 PHARMACY: [id number] [place], [place]  K (01/26/2020): 4.7  Pharmacy requests refill on: Hydrochlorothiazide 25 mg   LAST REFILL: 01/30/2020 (Q-90, R-1) LAST OV: 02/02/2020 NEXT OV: 08/07/2020 PHARMACY: [facility] [place], [place]

## 2020-08-02 ENCOUNTER — Other Ambulatory Visit: Payer: Medicare HMO

## 2020-08-05 ENCOUNTER — Other Ambulatory Visit: Payer: Medicare HMO

## 2020-08-07 ENCOUNTER — Other Ambulatory Visit: Payer: Self-pay

## 2020-08-07 ENCOUNTER — Encounter: Payer: Self-pay | Admitting: Family Medicine

## 2020-08-07 ENCOUNTER — Ambulatory Visit (INDEPENDENT_AMBULATORY_CARE_PROVIDER_SITE_OTHER): Payer: Medicare HMO | Admitting: Family Medicine

## 2020-08-07 VITALS — BP 120/70 | HR 77 | Temp 97.6°F | Ht 60.0 in | Wt 88.4 lb

## 2020-08-07 DIAGNOSIS — D649 Anemia, unspecified: Secondary | ICD-10-CM

## 2020-08-07 DIAGNOSIS — E038 Other specified hypothyroidism: Secondary | ICD-10-CM | POA: Insufficient documentation

## 2020-08-07 DIAGNOSIS — R634 Abnormal weight loss: Secondary | ICD-10-CM

## 2020-08-07 DIAGNOSIS — R946 Abnormal results of thyroid function studies: Secondary | ICD-10-CM | POA: Diagnosis not present

## 2020-08-07 DIAGNOSIS — I10 Essential (primary) hypertension: Secondary | ICD-10-CM | POA: Diagnosis not present

## 2020-08-07 DIAGNOSIS — E041 Nontoxic single thyroid nodule: Secondary | ICD-10-CM | POA: Diagnosis not present

## 2020-08-07 DIAGNOSIS — R739 Hyperglycemia, unspecified: Secondary | ICD-10-CM

## 2020-08-07 LAB — IBC PANEL
Iron: 133 ug/dL (ref 42–145)
Saturation Ratios: 37 % (ref 20.0–50.0)
Transferrin: 257 mg/dL (ref 212.0–360.0)

## 2020-08-07 LAB — BASIC METABOLIC PANEL
BUN: 38 mg/dL — ABNORMAL HIGH (ref 6–23)
CO2: 27 mEq/L (ref 19–32)
Calcium: 9.6 mg/dL (ref 8.4–10.5)
Chloride: 99 mEq/L (ref 96–112)
Creatinine, Ser: 1.09 mg/dL (ref 0.40–1.20)
GFR: 48.33 mL/min — ABNORMAL LOW (ref >60.00)
Glucose, Bld: 105 mg/dL — ABNORMAL HIGH (ref 70–99)
Potassium: 4.6 mEq/L (ref 3.5–5.1)
Sodium: 135 mEq/L (ref 135–145)

## 2020-08-07 LAB — HEMOGLOBIN A1C: Hgb A1c MFr Bld: 5.8 % (ref 4.6–6.5)

## 2020-08-07 LAB — T4, FREE: Free T4: 0.62 ng/dL (ref 0.60–1.60)

## 2020-08-07 LAB — TSH: TSH: 4.95 u[IU]/mL — ABNORMAL HIGH (ref 0.35–4.50)

## 2020-08-07 LAB — FOLATE: Folate: >23.6 ng/mL (ref >5.9)

## 2020-08-07 LAB — FERRITIN: Ferritin: 454.3 ng/mL — ABNORMAL HIGH (ref 10.0–291.0)

## 2020-08-07 NOTE — Progress Notes (Addendum)
Patient ID: Margaret Nguyen, female    DOB: 1941-03-13, 80 y.o.   MRN: [id number]  This visit was conducted in person.  BP 120/70 (BP Location: Left Arm, Patient Position: Sitting, Cuff Size: Normal)   Pulse 77   Temp 97.6 F (36.4 C) (Temporal)   Ht 5' (1.524 m)   Wt 88 lb 7 oz (40.1 kg)   SpO2 96%   BMI 17.27 kg/m    CC: 6 mo f/u visit  Subjective:   HPI: Margaret Nguyen is a 80 y.o. female presenting on 08/07/2020 for Weight Loss (Here for 6 mo f/u and labs. )   Weight loss has stabilized without further weight loss in the past 6 months. Previously lost 12 lbs (from 100lbs). Attributed to dental difficulties - has partial dentures, dealing with gingivitis affecting ability to eat PO. She feels she's doing better from this standpoint but still has difficulty chewing harder foods. She continues drinking 2 ensure daily. Overall feels appetite is ok.   No fevers/cihlls, dysphagia, swollen glands.  Notes night sweats. Endorses fatigue.  She is not taking iron tablet. She is taking b12 56mcg daily.  H/o rheumatic fever growing up with persistent valve.      Relevant past medical, surgical, family and social history reviewed and updated as indicated. Interim medical history since our last visit reviewed. Allergies and medications reviewed and updated. Outpatient Medications Prior to Visit  Medication Sig Dispense Refill  . acetaminophen (TYLENOL) 500 MG tablet Take 1 tablet (500 mg total) by mouth every 6 (six) hours as needed. 30 tablet 0  . amoxicillin (AMOXIL) 500 MG capsule Take 4 capsules (2,000 mg total) by mouth daily as needed (prior to dental work). 8 capsule 0  . Calcium Carb-Cholecalciferol 500-400 MG-UNIT TABS Take by mouth.    . Cholecalciferol (VITAMIN D3) 1000 UNITS CAPS Take 1 capsule by mouth daily.    . colchicine 0.6 MG tablet Take 1 tablet (0.6 mg total) by mouth daily as needed (foot/joint pain). 30 tablet 0  . enalapril (VASOTEC) 20 MG tablet  TAKE 1 TABLET BY MOUTH EVERY DAY 90 tablet 1  . hydrochlorothiazide (HYDRODIURIL) 25 MG tablet TAKE 1 TABLET(25 MG) BY MOUTH EVERY MORNING 90 tablet 1  . Multiple Vitamins-Minerals (CENTRUM PO) Take by mouth.    . potassium chloride (KLOR-CON) 10 MEQ tablet TAKE 1 TABLET BY MOUTH EVERY DAY 90 tablet 1  . vitamin B-12 (CYANOCOBALAMIN) 500 MCG tablet Take 1 tablet (500 mcg total) daily by mouth. (Patient taking differently: Take 500 mcg by mouth 2 (two) times a week. Takes 1/2 tablet)    . vitamin C (ASCORBIC ACID) 500 MG tablet Take 500 mg by mouth daily.     No facility-administered medications prior to visit.     Per HPI unless specifically indicated in ROS section below Review of Systems Objective:  BP 120/70 (BP Location: Left Arm, Patient Position: Sitting, Cuff Size: Normal)   Pulse 77   Temp 97.6 F (36.4 C) (Temporal)   Ht 5' (1.524 m)   Wt 88 lb 7 oz (40.1 kg)   SpO2 96%   BMI 17.27 kg/m   Wt Readings from Last 3 Encounters:  08/07/20 88 lb 7 oz (40.1 kg)  02/02/20 (!) 88 lb 4 oz (40 kg)  08/14/19 100 lb 4.8 oz (45.5 kg)      Physical Exam Vitals and nursing note reviewed.  Constitutional:      Appearance: Normal appearance. She is underweight.  She is not ill-appearing.  Eyes:     Extraocular Movements: Extraocular movements intact.     Pupils: Pupils are equal, round, and reactive to light.  Neck:     Thyroid: Thyroid mass (R nodule?) present. No thyromegaly.  Cardiovascular:     Rate and Rhythm: Normal rate and regular rhythm.     Pulses: Normal pulses.     Heart sounds: Murmur (2/6 systolic throughout) heard.    Pulmonary:     Effort: Pulmonary effort is normal. No respiratory distress.     Breath sounds: Normal breath sounds. No wheezing, rhonchi or rales.  Abdominal:     General: Abdomen is flat. Bowel sounds are normal. There is no distension.     Palpations: Abdomen is soft. There is no hepatomegaly, splenomegaly or mass.     Tenderness: There is no  abdominal tenderness. There is no guarding or rebound.     Hernia: No hernia is present.  Musculoskeletal:     Cervical back: Normal range of motion and neck supple. No rigidity.     Right lower leg: No edema.     Left lower leg: No edema.  Lymphadenopathy:     Cervical: No cervical adenopathy.  Skin:    General: Skin is warm and dry.     Findings: No rash.  Neurological:     Mental Status: She is alert.  Psychiatric:        Mood and Affect: Mood normal.        Behavior: Behavior normal.       Results for orders placed or performed in visit on 01/26/20  Hepatitis C antibody  Result Value Ref Range   Hepatitis C Ab NON-REACTIVE NON-REACTI   SIGNAL TO CUT-OFF 0.50 <1.00  CBC with Differential/Platelet  Result Value Ref Range   WBC 4.6 4.0 - 10.5 K/uL   RBC 3.45 (L) 3.87 - 5.11 Mil/uL   Hemoglobin 10.6 (L) 12.0 - 15.0 g/dL   HCT 32.4 (L) 36.0 - 46.0 %   MCV 93.8 78.0 - 100.0 fl   MCHC 32.7 30.0 - 36.0 g/dL   RDW 12.5 11.5 - 15.5 %   Platelets 258.0 150.0 - 400.0 K/uL   Neutrophils Relative % 64.6 43.0 - 77.0 %   Lymphocytes Relative 16.7 12.0 - 46.0 %   Monocytes Relative 16.4 (H) 3.0 - 12.0 %   Eosinophils Relative 1.4 0.0 - 5.0 %   Basophils Relative 0.9 0.0 - 3.0 %   Neutro Abs 3.0 1.4 - 7.7 K/uL   Lymphs Abs 0.8 0.7 - 4.0 K/uL   Monocytes Absolute 0.8 0.1 - 1.0 K/uL   Eosinophils Absolute 0.1 0.0 - 0.7 K/uL   Basophils Absolute 0.0 0.0 - 0.1 K/uL  TSH  Result Value Ref Range   TSH 4.74 (H) 0.35 - 4.50 uIU/mL  Comprehensive metabolic panel  Result Value Ref Range   Sodium 132 (L) 135 - 145 mEq/L   Potassium 4.7 3.5 - 5.1 mEq/L   Chloride 95 (L) 96 - 112 mEq/L   CO2 28 19 - 32 mEq/L   Glucose, Bld 127 (H) 70 - 99 mg/dL   BUN 21 6 - 23 mg/dL   Creatinine, Ser 1.00 0.40 - 1.20 mg/dL   Total Bilirubin 0.5 0.2 - 1.2 mg/dL   Alkaline Phosphatase 59 39 - 117 U/L   AST 23 0 - 37 U/L   ALT 15 0 - 35 U/L   Total Protein 7.1 6.0 - 8.3 g/dL   Albumin  4.3 3.5 - 5.2  g/dL   GFR 64.72 >60.00 mL/min   Calcium 9.8 8.4 - 10.5 mg/dL  Lipid panel  Result Value Ref Range   Cholesterol 160 0 - 200 mg/dL   Triglycerides 132.0 0.0 - 149.0 mg/dL   HDL 80.90 >39.00 mg/dL   VLDL 26.4 0.0 - 40.0 mg/dL   LDL Cholesterol 53 0 - 99 mg/dL   Total CHOL/HDL Ratio 2    NonHDL 78.98   VITAMIN D 25 Hydroxy (Vit-D Deficiency, Fractures)  Result Value Ref Range   VITD 95.43 30.00 - 100.00 ng/mL  IBC panel  Result Value Ref Range   Iron 99 42 - 145 ug/dL   Transferrin 238.0 212.0 - 360.0 mg/dL   Saturation Ratios 29.7 20.0 - 50.0 %  Ferritin  Result Value Ref Range   Ferritin 419.8 (H) 10.0 - 291.0 ng/mL  Vitamin B12  Result Value Ref Range   Vitamin B-12 530 211 - 911 pg/mL   Assessment & Plan:  This visit occurred during the SARS-CoV-2 public health emergency.  Safety protocols were in place, including screening questions prior to the visit, additional usage of staff PPE, and extensive cleaning of exam room while observing appropriate contact time as indicated for disinfecting solutions.   Problem List Items Addressed This Visit    Weight loss - Primary    Weight loss has stabilized but remains underweight.  Attributable to dental difficulties but she feels she's doing better from this standpoint, she continues regular ensure use. Declines orexigenic agent.  Further evaluate with labs today.  Will refer to nutritionist as well.       Relevant Orders   Amb ref to Medical Nutrition Therapy-MNT   CBC with Differential/Platelet   Thyroid nodule    Possible R thyroid nodule - check thyroid US.       Relevant Orders   US THYROID   Essential hypertension    Chronic, stable on enalapril and hctz with Kdur 76mEq daily. Update BMP.       Anemia, unspecified    Chronic normocytic anemia. Update labs including folate. B12 normal last visit.  She has stopped oral iron. Update iron panel.  With endorsed fatigue and night sweats, check periph smear. No h/o  lymphocytosis.       Relevant Orders   Lactate dehydrogenase   Pathologist smear review   CBC with Differential/Platelet   Abnormal thyroid function test    Update TFTs      Relevant Orders   CBC with Differential/Platelet    Other Visit Diagnoses    Hyperglycemia           No orders of the defined types were placed in this encounter.  Orders Placed This Encounter  Procedures  . US THYROID    Standing Status:   Future    Standing Expiration Date:   08/07/2021    Order Specific Question:   Reason for Exam (SYMPTOM  OR DIAGNOSIS REQUIRED)    Answer:   ?R thyroid nodule    Order Specific Question:   Preferred imaging location?    Answer:   [facility]  . Lactate dehydrogenase  . Pathologist smear review  . CBC with Differential/Platelet  . Amb ref to Medical Nutrition Therapy-MNT    Referral Priority:   Routine    Referral Type:   Consultation    Referral Reason:   Specialty Services Required    Requested Specialty:   Nutrition    Number of Visits Requested:  1    Patient Instructions  Labs today  Continue ensure. Continue 3 meals a day.  Let us know if any trouble.  Return in 6 months for physical.  We will refer you to nutritionist as well.   Follow up plan: Return in about 6 months (around 02/04/2021) for annual exam, prior fasting for blood work.  Ria Bush, MD

## 2020-08-07 NOTE — Assessment & Plan Note (Addendum)
Chronic, stable on enalapril and hctz with Kdur 60mEq daily. Update BMP.

## 2020-08-07 NOTE — Patient Instructions (Addendum)
Labs today  Continue ensure. Continue 3 meals a day.  Let us know if any trouble.  Return in 6 months for physical.  We will refer you to nutritionist as well.

## 2020-08-07 NOTE — Assessment & Plan Note (Signed)
Possible R thyroid nodule - check thyroid US.

## 2020-08-07 NOTE — Assessment & Plan Note (Signed)
Chronic normocytic anemia. Update labs including folate. B12 normal last visit.  She has stopped oral iron. Update iron panel.  With endorsed fatigue and night sweats, check periph smear. No h/o lymphocytosis.

## 2020-08-07 NOTE — Assessment & Plan Note (Signed)
Update TFTs ° °

## 2020-08-07 NOTE — Assessment & Plan Note (Signed)
Weight loss has stabilized but remains underweight.  Attributable to dental difficulties but she feels she's doing better from this standpoint, she continues regular ensure use. Declines orexigenic agent.  Further evaluate with labs today.  Will refer to nutritionist as well.

## 2020-08-08 ENCOUNTER — Encounter: Payer: Self-pay | Admitting: Family Medicine

## 2020-08-08 ENCOUNTER — Other Ambulatory Visit (INDEPENDENT_AMBULATORY_CARE_PROVIDER_SITE_OTHER): Payer: Medicare HMO

## 2020-08-08 DIAGNOSIS — R7989 Other specified abnormal findings of blood chemistry: Secondary | ICD-10-CM

## 2020-08-08 LAB — PATHOLOGIST SMEAR REVIEW

## 2020-08-08 LAB — CBC WITH DIFFERENTIAL/PLATELET
Absolute Monocytes: 456 cells/uL (ref 200–950)
Basophils Absolute: 0 cells/uL (ref 0–200)
Basophils Relative: 0 %
Eosinophils Absolute: 20 cells/uL (ref 15–500)
Eosinophils Relative: 0.6 %
HCT: 30.1 % — ABNORMAL LOW (ref 35.0–45.0)
Hemoglobin: 10.2 g/dL — ABNORMAL LOW (ref 11.7–15.5)
Lymphs Abs: 721 cells/uL — ABNORMAL LOW (ref 850–3900)
MCH: 31.2 pg (ref 27.0–33.0)
MCHC: 33.9 g/dL (ref 32.0–36.0)
MCV: 92 fL (ref 80.0–100.0)
MPV: 11.4 fL (ref 7.5–12.5)
Monocytes Relative: 13.4 %
Neutro Abs: 2203 cells/uL (ref 1500–7800)
Neutrophils Relative %: 64.8 %
Platelets: 258 10*3/uL (ref 140–400)
RBC: 3.27 10*6/uL — ABNORMAL LOW (ref 3.80–5.10)
RDW: 12.9 % (ref 11.0–15.0)
Total Lymphocyte: 21.2 %
WBC: 3.4 10*3/uL — ABNORMAL LOW (ref 3.8–10.8)

## 2020-08-08 LAB — HEPATIC FUNCTION PANEL
ALT: 25 U/L (ref 0–35)
AST: 40 U/L — ABNORMAL HIGH (ref 0–37)
Albumin: 4.3 g/dL (ref 3.5–5.2)
Alkaline Phosphatase: 58 U/L (ref 39–117)
Bilirubin, Direct: 0.1 mg/dL (ref 0.0–0.3)
Total Bilirubin: 0.5 mg/dL (ref 0.2–1.2)
Total Protein: 7.6 g/dL (ref 6.0–8.3)

## 2020-08-08 LAB — LACTATE DEHYDROGENASE: LDH: 141 U/L (ref 120–250)

## 2020-08-12 ENCOUNTER — Encounter: Payer: Self-pay | Admitting: Family Medicine

## 2020-08-12 DIAGNOSIS — K760 Fatty (change of) liver, not elsewhere classified: Secondary | ICD-10-CM | POA: Insufficient documentation

## 2020-08-16 ENCOUNTER — Ambulatory Visit
Admission: RE | Admit: 2020-08-16 | Discharge: 2020-08-16 | Disposition: A | Discharge status: Home / Self Care | Payer: Medicare HMO | Source: Ambulatory Visit | Attending: Family Medicine | Admitting: Family Medicine

## 2020-08-16 ENCOUNTER — Other Ambulatory Visit: Payer: Self-pay

## 2020-08-16 DIAGNOSIS — E041 Nontoxic single thyroid nodule: Secondary | ICD-10-CM | POA: Diagnosis not present

## 2020-08-16 DIAGNOSIS — Z0389 Encounter for observation for other suspected diseases and conditions ruled out: Secondary | ICD-10-CM | POA: Diagnosis not present

## 2020-09-09 ENCOUNTER — Encounter: Payer: Self-pay | Admitting: Dietician

## 2020-09-09 ENCOUNTER — Encounter: Payer: Medicare HMO | Attending: Family Medicine | Admitting: Dietician

## 2020-09-09 ENCOUNTER — Other Ambulatory Visit: Payer: Self-pay

## 2020-09-09 VITALS — Ht 61.0 in | Wt 86.2 lb

## 2020-09-09 DIAGNOSIS — Z681 Body mass index (BMI) 19 or less, adult: Secondary | ICD-10-CM | POA: Insufficient documentation

## 2020-09-09 DIAGNOSIS — R7303 Prediabetes: Secondary | ICD-10-CM | POA: Insufficient documentation

## 2020-09-09 DIAGNOSIS — R636 Underweight: Secondary | ICD-10-CM | POA: Diagnosis not present

## 2020-09-09 DIAGNOSIS — R634 Abnormal weight loss: Secondary | ICD-10-CM

## 2020-09-09 NOTE — Progress Notes (Signed)
Medical Nutrition Therapy: Visit start time: 1330  end time: 1430  Assessment:  Diagnosis: underweight Past medical history: HTN, diverticulosis Psychosocial issues/ stress concerns: none  Preferred learning method:  . Hands-on   Current weight: 86.2lbs Height: 5'1" BMI: 16.29 Medications, supplements: reconciled list in medical record  Progress and evaluation:   Patient reports getting dentures 05/2019, and then developed ginigivitis, she has been losing weight since then. Her normal weight is about 102-103lbs.   She reports feeling weak and frail since losing weight.  She has begun drinking 1 Ensure daily but unable to reverse weight loss with one daily; recently increased to 2 daily per daughter's advice.  She has found chewing to be more uncomfortable, and has often missed snack or meal times. She lives alone and cooks her own meals.  Physical activity: walking 30 minutes 1 time a week; reduced recently due to reduced energy, weakness  Dietary Intake:  Usual eating pattern includes 2 meals and 1-2 snacks per day. Dining out frequency: ? meals per week.  Breakfast: rice with sm amt butter, sugar, toast with butter; cereal Cheerios; oatmeal; stewed apples Snack:  Lunch: peanut butter crackers/ orange/ banana/ cherries Snack:  Supper: green beans/ brocc/ greens + mashed/ boiled potatoes + baked chicken (soft) Snack:  Beverages: Ensure 1 in am, often 1 at night; water,   Nutrition Care Education: Topics covered:  Basic nutrition: basic food groups, appropriate nutrient balance, appropriate meal and snack schedule, general nutrition guidelines    Weight Gain: importance of eating at regular intervals; choosing foods with some fat or adding healthy fats to boost caloric value; including protein source regularly + some carbohydrate for energy; making meal/ snack times pleasant and enjoyable; minimizing cooking effort;  Dentition: soft food choices    Nutritional Diagnosis:   [place]-3.2 Unintentional weight loss As related to history of gingivitis and new dentures.  As evidenced by patient report of health history, and current BMI of 17.  Intervention:  . Instruction and discussion as noted above. . Established goals for increasing calorie and protein intake, with input from patient.  . Patient declined MNT follow-up at this time, but will schedule later when she has more certainty of coverage. . Encouraged patient to call with any questions or concerns.  Education Materials given:  . Gaining Weight in a Healthful Way . Plate Planner with food lists . Sample personalized menus (hand written) . Visit summary with goals/ instructions   Learner/ who was taught:  . Patient   Level of understanding: Marland Kitchen Verbalizes/ demonstrates competency   Demonstrated degree of understanding via:   Teach back Learning barriers: . None  Willingness to learn/ readiness for change: . Acceptance, ready for change   Monitoring and Evaluation:  Dietary intake, exercise, and body weight      follow up: prn

## 2020-09-09 NOTE — Patient Instructions (Signed)
·   Make sure to eat something every 3 hours during the day. Cool, fresh foods can be easier to eat if you are not feeling hungry.  Try some boiled eggs, deviled eggs or egg salad for good protein and calories. Cottage cheese with fruit is also a great snack that provide protein and vitamins.  Peanut butter as a snack on crackers or with fruit is a healthy way to add calories.   Can mix frozen fruit with [place] yogurt in a blender and make a healthy "smoothie" or shake

## 2020-09-15 ENCOUNTER — Encounter: Payer: Self-pay | Admitting: Family Medicine

## 2020-09-15 DIAGNOSIS — R636 Underweight: Secondary | ICD-10-CM | POA: Insufficient documentation

## 2021-01-29 ENCOUNTER — Ambulatory Visit (INDEPENDENT_AMBULATORY_CARE_PROVIDER_SITE_OTHER): Payer: Medicare HMO

## 2021-01-29 ENCOUNTER — Other Ambulatory Visit: Payer: Self-pay | Admitting: Family Medicine

## 2021-01-29 DIAGNOSIS — D649 Anemia, unspecified: Secondary | ICD-10-CM

## 2021-01-29 DIAGNOSIS — Z0000 Encounter for general adult medical examination without abnormal findings: Secondary | ICD-10-CM | POA: Diagnosis not present

## 2021-01-29 DIAGNOSIS — N289 Disorder of kidney and ureter, unspecified: Secondary | ICD-10-CM

## 2021-01-29 DIAGNOSIS — E785 Hyperlipidemia, unspecified: Secondary | ICD-10-CM

## 2021-01-29 DIAGNOSIS — E538 Deficiency of other specified B group vitamins: Secondary | ICD-10-CM

## 2021-01-29 DIAGNOSIS — E038 Other specified hypothyroidism: Secondary | ICD-10-CM

## 2021-01-29 NOTE — Progress Notes (Signed)
Subjective:   Margaret Nguyen is a 80 y.o. female who presents for Medicare Annual (Subsequent) preventive examination.  Review of Systems: N/A      I connected with the patient today by telephone and verified that I am speaking with the correct person using two identifiers. Location patient: home Location nurse: work Persons participating in the telephone visit: patient, nurse.   I discussed the limitations, risks, security and privacy concerns of performing an evaluation and management service by telephone and the availability of in person appointments. I also discussed with the patient that there may be a patient responsible charge related to this service. The patient expressed understanding and verbally consented to this telephonic visit.        Cardiac Risk Factors include: advanced age (>61men, >105 women);hypertension     Objective:    Today's Vitals   There is no height or weight on file to calculate BMI.  Advanced Directives 01/29/2021 09/09/2020 03/24/2018 01/12/2018 05/03/2014 04/19/2014 04/19/2014  Does Patient Have a Medical Advance Directive? No No No No No No Yes  Type of Advance Directive - - - - - - Press photographer  Does patient want to make changes to medical advance directive? - - - - - - No - Patient declined  Copy of [place] in Chart? - - - - - - No - copy requested  Would patient like information on creating a medical advance directive? No - Patient declined No - Patient declined - No - Patient declined - - -    Current Medications (verified) Outpatient Encounter Medications as of 01/29/2021  Medication Sig   acetaminophen (TYLENOL) 500 MG tablet Take 1 tablet (500 mg total) by mouth every 6 (six) hours as needed.   Calcium Carb-Cholecalciferol 500-400 MG-UNIT TABS Take by mouth.   Cholecalciferol (VITAMIN D3) 1000 UNITS CAPS Take 1 capsule by mouth daily.   colchicine 0.6 MG tablet Take 1 tablet (0.6 mg total) by mouth  daily as needed (foot/joint pain).   enalapril (VASOTEC) 20 MG tablet TAKE 1 TABLET BY MOUTH EVERY DAY   hydrochlorothiazide (HYDRODIURIL) 25 MG tablet TAKE 1 TABLET(25 MG) BY MOUTH EVERY MORNING   Multiple Vitamins-Minerals (CENTRUM PO) Take by mouth.   potassium chloride (KLOR-CON) 10 MEQ tablet TAKE 1 TABLET BY MOUTH EVERY DAY   vitamin B-12 (CYANOCOBALAMIN) 500 MCG tablet Take 1 tablet (500 mcg total) daily by mouth. (Patient taking differently: Take 500 mcg by mouth 2 (two) times a week. Takes 1/2 tablet)   vitamin C (ASCORBIC ACID) 500 MG tablet Take 500 mg by mouth daily.   No facility-administered encounter medications on file as of 01/29/2021.    Allergies (verified) Other   History: Past Medical History:  Diagnosis Date   Allergy    Anemia    Arthritis    Blood transfusion without reported diagnosis    Diverticulitis    Family hx colonic polyps    Heart murmur    has Rheumatic fever as a child    Hiatal hernia    Hypertension    Osteoporosis 08/2009   R femur -2.5   Villous adenoma of colon    Past Surgical History:  Procedure Laterality Date   ABDOMINAL HYSTERECTOMY     COLONOSCOPY  04/2010   polyps, diverticulosis, hem, rec rpt 3 yrs [facility])   COLONOSCOPY  04/2014   mult TA, one with high grade dysplasia, mod diverticulosis, rpt 3 yrs [facility])   COLONOSCOPY  03/2019  1 TA, diverticulosis, no f/u planned [facility])   DEXA  08/2009   femur -2.5   DEXA  04/2015   T -3 hips, -1.8 spine   LAPAROSCOPIC SIGMOID COLECTOMY  1999?   villous adenoma   PARTIAL HYSTERECTOMY     ovaries remain   TOE SURGERY     TUBAL LIGATION     Family History  Problem Relation Age of Onset   Arthritis Mother    Arthritis Father    Heart disease Father    Hypertension Father    Colon cancer Neg Hx    Esophageal cancer Neg Hx    Rectal cancer Neg Hx    Stomach cancer Neg Hx    Colon polyps Neg Hx    Social History   Socioeconomic History   Marital status: Widowed    Spouse  name: Not on file   Number of children: Not on file   Years of education: Not on file   Highest education level: Not on file  Occupational History   Not on file  Tobacco Use   Smoking status: Never   Smokeless tobacco: Never  Vaping Use   Vaping Use: Never used  Substance and Sexual Activity   Alcohol use: Yes    Comment: occasional   Drug use: Never   Sexual activity: Not Currently  Other Topics Concern   Not on file  Social History Narrative   Married [phone], widowed   1 son, 2 daughters, 1 son deceased   5 grandchildren   Lives alone; I- ADLs   Working-fulltime [employer]         Social Determinants of Health   Financial Resource Strain: Low Risk    Difficulty of Paying Living Expenses: Not hard at all  Food Insecurity: No Food Insecurity   Worried About Charity fundraiser in the Last Year: Never true   Arboriculturist in the Last Year: Never true  Transportation Needs: No Transportation Needs   Lack of Transportation (Medical): No   Lack of Transportation (Non-Medical): No  Physical Activity: Inactive   Days of Exercise per Week: 0 days   Minutes of Exercise per Session: 0 min  Stress: No Stress Concern Present   Feeling of Stress : Not at all  Social Connections: Not on file    Tobacco Counseling Counseling given: Not Answered   Clinical Intake:  Pre-visit preparation completed: Yes  Pain : No/denies pain     Nutritional Risks: None Diabetes: No  How often do you need to have someone help you when you read instructions, pamphlets, or other written materials from your doctor or pharmacy?: 1 - Never  Diabetic: No Nutrition Risk Assessment:  Has the patient had any N/V/D within the last 2 months?  No  Does the patient have any non-healing wounds?  No  Has the patient had any unintentional weight loss or weight gain?  No   Diabetes:  Is the patient diabetic?  No  If diabetic, was a CBG obtained today?   N/A Did the patient bring in  their glucometer from home?   N/A How often do you monitor your CBG's? N/A.   Financial Strains and Diabetes Management:  Are you having any financial strains with the device, your supplies or your medication?  N/A .  Does the patient want to be seen by Chronic Care Management for management of their diabetes?   N/A Would the patient like to be referred to a Nutritionist or for  Diabetic Management?   N/A  Interpreter Needed?: No  Information entered by :: CJohnson, RN   Activities of Daily Living In your present state of health, do you have any difficulty performing the following activities: 01/29/2021  Hearing? Y  Comment some hearing loss noted  Vision? N  Difficulty concentrating or making decisions? N  Walking or climbing stairs? N  Dressing or bathing? N  Doing errands, shopping? N  Preparing Food and eating ? N  Using the Toilet? N  In the past six months, have you accidently leaked urine? N  Do you have problems with loss of bowel control? N  Managing your Medications? N  Managing your Finances? N  Housekeeping or managing your Housekeeping? N  Some recent data might be hidden    Patient Care Team: Ria Bush, MD as PCP - General (Family Medicine)  Indicate any recent Medical Services you may have received from other than [facility] providers in the past year (date may be approximate).     Assessment:   This is a routine wellness examination for Margaret Nguyen.  Hearing/Vision screen Vision Screening - Comments:: Advised patient to get annual eye exams   Dietary issues and exercise activities discussed: Current Exercise Habits: The patient does not participate in regular exercise at present, Exercise limited by: None identified   Goals Addressed             This Visit's Progress    Patient Stated       01/29/2021, I will maintain and continue medications as prescribed.        Depression Screen PHQ 2/9 Scores 01/29/2021 09/09/2020 02/02/2020 01/27/2019 01/12/2018  11/09/2016 09/09/2015  PHQ - 2 Score 0 0 2 0 0 0 0  PHQ- 9 Score 0 - 13 - 0 - -    Fall Risk Fall Risk  01/29/2021 09/09/2020 02/02/2020 01/27/2019 01/12/2018  Falls in the past year? 1 1 0 1 No  Comment - fell on ice during winter storm - - -  Number falls in past yr: 1 0 - 0 -  Injury with Fall? 0 0 - 1 -  Risk for fall due to : Impaired balance/gait;Medication side effect - - - -  Follow up Falls evaluation completed;Falls prevention discussed - - - -    FALL RISK PREVENTION PERTAINING TO THE HOME:  Any stairs in or around the home? Yes  If so, are there any without handrails? No  Home free of loose throw rugs in walkways, pet beds, electrical cords, etc? Yes  Adequate lighting in your home to reduce risk of falls? Yes   ASSISTIVE DEVICES UTILIZED TO PREVENT FALLS:  Life alert? No  Use of a cane, walker or w/c? Yes  Grab bars in the bathroom? No  Shower chair or bench in shower? Yes  Elevated toilet seat or a handicapped toilet? No   TIMED UP AND GO:  Was the test performed?  N/A telephone visit .    Cognitive Function: MMSE - Mini Mental State Exam 01/29/2021 01/12/2018  Orientation to time 5 5  Orientation to Place 5 5  Registration 3 3  Attention/ Calculation 5 0  Recall 2 2  Recall-comments - unable to recall 1 of 3 words  Language- name 2 objects - 0  Language- repeat 1 1  Language- follow 3 step command - 3  Language- read & follow direction - 0  Write a sentence - 0  Copy design - 0  Total score - 19  Mini  Cog  Mini-Cog screen was completed. Maximum score is 22. A value of 0 denotes this part of the MMSE was not completed or the patient failed this part of the Mini-Cog screening.       Immunizations Immunization History  Administered Date(s) Administered   Fluad Quad(high Dose 65+) 05/15/2019, 05/31/2020   Influenza Split 04/19/2012   Influenza Whole 06/13/2009   Influenza, Seasonal, Injecte, Preservative Fre 06/12/2016   Influenza,inj,Quad PF,6+ Mos  07/02/2014, 03/08/2015, 05/11/2017, 03/30/2018   Influenza-Unspecified 05/13/2013   PFIZER(Purple Top)SARS-COV-2 Vaccination 09/25/2019, 10/17/2019, 06/05/2020   Pneumococcal Conjugate-13 09/09/2015   Pneumococcal Polysaccharide-23 02/24/2006   Tdap 01/22/2011    TDAP status: Due, Education has been provided regarding the importance of this vaccine. Advised may receive this vaccine at local pharmacy or Health Dept. Aware to provide a copy of the vaccination record if obtained from local pharmacy or Health Dept. Verbalized acceptance and understanding.  Flu Vaccine status: Up to date  Pneumococcal vaccine status: Up to date  Covid-19 vaccine status: Completed 3 vaccines  Qualifies for Shingles Vaccine? Yes   Zostavax completed No   Shingrix Completed?: No.    Education has been provided regarding the importance of this vaccine. Patient has been advised to call insurance company to determine out of pocket expense if they have not yet received this vaccine. Advised may also receive vaccine at local pharmacy or Health Dept. Verbalized acceptance and understanding.  Screening Tests Health Maintenance  Topic Date Due   Zoster Vaccines- Shingrix (1 of 2) Never done   MAMMOGRAM  03/13/2020   COVID-19 Vaccine (4 - Booster for Pfizer series) 10/03/2020   TETANUS/TDAP  01/21/2021   INFLUENZA VACCINE  02/10/2021   DEXA SCAN  Completed   Hepatitis C Screening  Completed   PNA vac Low Risk Adult  Completed   HPV VACCINES  Aged Out    Health Maintenance  Health Maintenance Due  Topic Date Due   Zoster Vaccines- Shingrix (1 of 2) Never done   MAMMOGRAM  03/13/2020   COVID-19 Vaccine (4 - Booster for Pfizer series) 10/03/2020   TETANUS/TDAP  01/21/2021    Colorectal cancer screening: No longer required.   Mammogram status: No longer required due to age.  Bone Density status: Completed 03/14/2019. Results reflect: Bone density results: OSTEOPOROSIS. Repeat every 2 years.  Lung Cancer  Screening: (Low Dose CT Chest recommended if Age 58-80 years, 30 pack-year currently smoking OR have quit w/in 15years.) does not qualify.   Additional Screening:  Hepatitis C Screening: does qualify; Completed 01/26/2020  Vision Screening: Recommended annual ophthalmology exams for early detection of glaucoma and other disorders of the eye. Is the patient up to date with their annual eye exam?  No  Who is the provider or what is the name of the office in which the patient attends annual eye exams? Does not see eye doctor regularly If pt is not established with a provider, would they like to be referred to a provider to establish care? No .   Dental Screening: Recommended annual dental exams for proper oral hygiene  Community Resource Referral / Chronic Care Management: CRR required this visit?  No   CCM required this visit?  No      Plan:     I have personally reviewed and noted the following in the patient's chart:   Medical and social history Use of alcohol, tobacco or illicit drugs  Current medications and supplements including opioid prescriptions.  Functional ability and status Nutritional status Physical  activity Advanced directives List of other physicians Hospitalizations, surgeries, and ER visits in previous 12 months Vitals Screenings to include cognitive, depression, and falls Referrals and appointments  In addition, I have reviewed and discussed with patient certain preventive protocols, quality metrics, and best practice recommendations. A written personalized care plan for preventive services as well as general preventive health recommendations were provided to patient.   Due to this being a telephonic visit, the after visit summary with patients personalized plan was offered to patient via office or my-chart. Patient preferred to pick up at office at next visit or via mychart.   Andrez Grime, LPN   [id number]

## 2021-01-29 NOTE — Patient Instructions (Signed)
Margaret Nguyen , Thank you for taking time to come for your Medicare Wellness Visit. I appreciate your ongoing commitment to your health goals. Please review the following plan we discussed and let me know if I can assist you in the future.   Screening recommendations/referrals: Colonoscopy: Up to date, completed 04/08/2019, no longer required  Mammogram: no loner required  Bone Density: Up to date, completed 03/14/2019, due 03/2021 Recommended yearly ophthalmology/optometry visit for glaucoma screening and checkup Recommended yearly dental visit for hygiene and checkup  Vaccinations: Influenza vaccine: Up to date, completed 05/31/2020, due 02/2021 Pneumococcal vaccine: Completed series Tdap vaccine: decline-insurance  Shingles vaccine: due, check with your insurance regarding coverage if interested    Covid-19:completed 3 vaccines   Advanced directives: Advance directive discussed with you today. Even though you declined this today please call our office should you change your mind and we can give you the proper paperwork for you to fill out.  Conditions/risks identified: hypertension   Next appointment: Follow up in one year for your annual wellness visit    Preventive Care 3 Years and Older, Female Preventive care refers to lifestyle choices and visits with your health care provider that can promote health and wellness. What does preventive care include? A yearly physical exam. This is also called an annual well check. Dental exams once or twice a year. Routine eye exams. Ask your health care provider how often you should have your eyes checked. Personal lifestyle choices, including: Daily care of your teeth and gums. Regular physical activity. Eating a healthy diet. Avoiding tobacco and drug use. Limiting alcohol use. Practicing safe sex. Taking low-dose aspirin every day. Taking vitamin and mineral supplements as recommended by your health care provider. What happens during an  annual well check? The services and screenings done by your health care provider during your annual well check will depend on your age, overall health, lifestyle risk factors, and family history of disease. Counseling  Your health care provider may ask you questions about your: Alcohol use. Tobacco use. Drug use. Emotional well-being. Home and relationship well-being. Sexual activity. Eating habits. History of falls. Memory and ability to understand (cognition). Work and work Statistician. Reproductive health. Screening  You may have the following tests or measurements: Height, weight, and BMI. Blood pressure. Lipid and cholesterol levels. These may be checked every 5 years, or more frequently if you are over 87 years old. Skin check. Lung cancer screening. You may have this screening every year starting at age 72 if you have a 30-pack-year history of smoking and currently smoke or have quit within the past 15 years. Fecal occult blood test (FOBT) of the stool. You may have this test every year starting at age 21. Flexible sigmoidoscopy or colonoscopy. You may have a sigmoidoscopy every 5 years or a colonoscopy every 10 years starting at age 55. Hepatitis C blood test. Hepatitis B blood test. Sexually transmitted disease (STD) testing. Diabetes screening. This is done by checking your blood sugar (glucose) after you have not eaten for a while (fasting). You may have this done every 1-3 years. Bone density scan. This is done to screen for osteoporosis. You may have this done starting at age 1. Mammogram. This may be done every 1-2 years. Talk to your health care provider about how often you should have regular mammograms. Talk with your health care provider about your test results, treatment options, and if necessary, the need for more tests. Vaccines  Your health care provider may recommend certain  vaccines, such as: Influenza vaccine. This is recommended every year. Tetanus,  diphtheria, and acellular pertussis (Tdap, Td) vaccine. You may need a Td booster every 10 years. Zoster vaccine. You may need this after age 61. Pneumococcal 13-valent conjugate (PCV13) vaccine. One dose is recommended after age 57. Pneumococcal polysaccharide (PPSV23) vaccine. One dose is recommended after age 6. Talk to your health care provider about which screenings and vaccines you need and how often you need them. This information is not intended to replace advice given to you by your health care provider. Make sure you discuss any questions you have with your health care provider. Document Released: 07/26/2015 Document Revised: 03/18/2016 Document Reviewed: 04/30/2015 Elsevier Interactive Patient Education  2017 [employer] Prevention in the Home Falls can cause injuries. They can happen to people of all ages. There are many things you can do to make your home safe and to help prevent falls. What can I do on the outside of my home? Regularly fix the edges of walkways and driveways and fix any cracks. Remove anything that might make you trip as you walk through a door, such as a raised step or threshold. Trim any bushes or trees on the path to your home. Use bright outdoor lighting. Clear any walking paths of anything that might make someone trip, such as rocks or tools. Regularly check to see if handrails are loose or broken. Make sure that both sides of any steps have handrails. Any raised decks and porches should have guardrails on the edges. Have any leaves, snow, or ice cleared regularly. Use sand or salt on walking paths during winter. Clean up any spills in your garage right away. This includes oil or grease spills. What can I do in the bathroom? Use night lights. Install grab bars by the toilet and in the tub and shower. Do not use towel bars as grab bars. Use non-skid mats or decals in the tub or shower. If you need to sit down in the shower, use a plastic,  non-slip stool. Keep the floor dry. Clean up any water that spills on the floor as soon as it happens. Remove soap buildup in the tub or shower regularly. Attach bath mats securely with double-sided non-slip rug tape. Do not have throw rugs and other things on the floor that can make you trip. What can I do in the bedroom? Use night lights. Make sure that you have a light by your bed that is easy to reach. Do not use any sheets or blankets that are too big for your bed. They should not hang down onto the floor. Have a firm chair that has side arms. You can use this for support while you get dressed. Do not have throw rugs and other things on the floor that can make you trip. What can I do in the kitchen? Clean up any spills right away. Avoid walking on wet floors. Keep items that you use a lot in easy-to-reach places. If you need to reach something above you, use a strong step stool that has a grab bar. Keep electrical cords out of the way. Do not use floor polish or wax that makes floors slippery. If you must use wax, use non-skid floor wax. Do not have throw rugs and other things on the floor that can make you trip. What can I do with my stairs? Do not leave any items on the stairs. Make sure that there are handrails on both sides of the stairs  and use them. Fix handrails that are broken or loose. Make sure that handrails are as long as the stairways. Check any carpeting to make sure that it is firmly attached to the stairs. Fix any carpet that is loose or worn. Avoid having throw rugs at the top or bottom of the stairs. If you do have throw rugs, attach them to the floor with carpet tape. Make sure that you have a light switch at the top of the stairs and the bottom of the stairs. If you do not have them, ask someone to add them for you. What else can I do to help prevent falls? Wear shoes that: Do not have high heels. Have rubber bottoms. Are comfortable and fit you well. Are closed  at the toe. Do not wear sandals. If you use a stepladder: Make sure that it is fully opened. Do not climb a closed stepladder. Make sure that both sides of the stepladder are locked into place. Ask someone to hold it for you, if possible. Clearly mark and make sure that you can see: Any grab bars or handrails. First and last steps. Where the edge of each step is. Use tools that help you move around (mobility aids) if they are needed. These include: Canes. Walkers. Scooters. Crutches. Turn on the lights when you go into a dark area. Replace any light bulbs as soon as they burn out. Set up your furniture so you have a clear path. Avoid moving your furniture around. If any of your floors are uneven, fix them. If there are any pets around you, be aware of where they are. Review your medicines with your doctor. Some medicines can make you feel dizzy. This can increase your chance of falling. Ask your doctor what other things that you can do to help prevent falls. This information is not intended to replace advice given to you by your health care provider. Make sure you discuss any questions you have with your health care provider. Document Released: 04/25/2009 Document Revised: 12/05/2015 Document Reviewed: 08/03/2014 Elsevier Interactive Patient Education  2017 [employer].

## 2021-01-29 NOTE — Progress Notes (Signed)
PCP notes:  Health Maintenance: Shingrix- due Tdap- insurance  Abnormal Screenings: none   Patient concerns: none   Nurse concerns: none   Next PCP appt.: 02/08/2019 @ 9:30 am

## 2021-01-30 ENCOUNTER — Other Ambulatory Visit: Payer: Self-pay

## 2021-01-30 ENCOUNTER — Other Ambulatory Visit (INDEPENDENT_AMBULATORY_CARE_PROVIDER_SITE_OTHER): Payer: Medicare HMO

## 2021-01-30 ENCOUNTER — Telehealth: Payer: Self-pay

## 2021-01-30 DIAGNOSIS — E038 Other specified hypothyroidism: Secondary | ICD-10-CM | POA: Diagnosis not present

## 2021-01-30 DIAGNOSIS — E785 Hyperlipidemia, unspecified: Secondary | ICD-10-CM

## 2021-01-30 DIAGNOSIS — N289 Disorder of kidney and ureter, unspecified: Secondary | ICD-10-CM | POA: Diagnosis not present

## 2021-01-30 DIAGNOSIS — D649 Anemia, unspecified: Secondary | ICD-10-CM | POA: Diagnosis not present

## 2021-01-30 DIAGNOSIS — E538 Deficiency of other specified B group vitamins: Secondary | ICD-10-CM | POA: Diagnosis not present

## 2021-01-30 LAB — CBC WITH DIFFERENTIAL/PLATELET
Basophils Absolute: 0 10*3/uL (ref 0.0–0.1)
Basophils Relative: 1 % (ref 0.0–3.0)
Eosinophils Absolute: 0 10*3/uL (ref 0.0–0.7)
Eosinophils Relative: 0.9 % (ref 0.0–5.0)
HCT: 33.9 % — ABNORMAL LOW (ref 36.0–46.0)
Hemoglobin: 11.1 g/dL — ABNORMAL LOW (ref 12.0–15.0)
Lymphocytes Relative: 20.8 % (ref 12.0–46.0)
Lymphs Abs: 1 10*3/uL (ref 0.7–4.0)
MCHC: 32.7 g/dL (ref 30.0–36.0)
MCV: 90.9 fl (ref 78.0–100.0)
Monocytes Absolute: 0.8 10*3/uL (ref 0.1–1.0)
Monocytes Relative: 16.4 % — ABNORMAL HIGH (ref 3.0–12.0)
Neutro Abs: 3 10*3/uL (ref 1.4–7.7)
Neutrophils Relative %: 60.9 % (ref 43.0–77.0)
Platelets: 253 10*3/uL (ref 150.0–400.0)
RBC: 3.72 Mil/uL — ABNORMAL LOW (ref 3.87–5.11)
RDW: 13.4 % (ref 11.5–15.5)
WBC: 4.9 10*3/uL (ref 4.0–10.5)

## 2021-01-30 LAB — VITAMIN D 25 HYDROXY (VIT D DEFICIENCY, FRACTURES): VITD: 105.11 ng/mL (ref 30.00–100.00)

## 2021-01-30 LAB — COMPREHENSIVE METABOLIC PANEL
ALT: 19 U/L (ref 0–35)
AST: 31 U/L (ref 0–37)
Albumin: 4.3 g/dL (ref 3.5–5.2)
Alkaline Phosphatase: 56 U/L (ref 39–117)
BUN: 42 mg/dL — ABNORMAL HIGH (ref 6–23)
CO2: 24 mEq/L (ref 19–32)
Calcium: 9.8 mg/dL (ref 8.4–10.5)
Chloride: 99 mEq/L (ref 96–112)
Creatinine, Ser: 1.15 mg/dL (ref 0.40–1.20)
GFR: 45.17 mL/min — ABNORMAL LOW (ref >60.00)
Glucose, Bld: 140 mg/dL — ABNORMAL HIGH (ref 70–99)
Potassium: 4.3 mEq/L (ref 3.5–5.1)
Sodium: 136 mEq/L (ref 135–145)
Total Bilirubin: 0.7 mg/dL (ref 0.2–1.2)
Total Protein: 7.4 g/dL (ref 6.0–8.3)

## 2021-01-30 LAB — LIPID PANEL
Cholesterol: 169 mg/dL (ref 0–200)
HDL: 84 mg/dL (ref >39.00)
LDL Cholesterol: 52 mg/dL (ref 0–99)
NonHDL: 85.2
Total CHOL/HDL Ratio: 2
Triglycerides: 166 mg/dL — ABNORMAL HIGH (ref 0.0–149.0)
VLDL: 33.2 mg/dL (ref 0.0–40.0)

## 2021-01-30 LAB — VITAMIN B12: Vitamin B-12: 731 pg/mL (ref 211–911)

## 2021-01-30 LAB — TSH: TSH: 3.98 u[IU]/mL (ref 0.35–5.50)

## 2021-01-30 LAB — T4, FREE: Free T4: 0.65 ng/dL (ref 0.60–1.60)

## 2021-01-30 LAB — FERRITIN: Ferritin: 480.1 ng/mL — ABNORMAL HIGH (ref 10.0–291.0)

## 2021-01-30 NOTE — Telephone Encounter (Signed)
[facility] called critical labs @ 1600  Vit D 105.[place]

## 2021-01-30 NOTE — Telephone Encounter (Signed)
Noted. Hold vitamin D for now.

## 2021-01-31 ENCOUNTER — Other Ambulatory Visit: Payer: Self-pay | Admitting: Family Medicine

## 2021-01-31 NOTE — Telephone Encounter (Signed)
Lvm asking pt to call back.  Need to relay Dr. G's message.  

## 2021-01-31 NOTE — Telephone Encounter (Signed)
Appreciate PCP input.  I will defer.

## 2021-02-03 NOTE — Telephone Encounter (Signed)
[facility] - Client Nonclinical Telephone Record  [facility] Client [facility] - Client Client Site [place] Physician Ria Bush - MD Contact Type Call Who Is Calling Patient / Member / Family / Caregiver Caller Name [place] Phone Number [phone] Call Type Message Only Information Provided Reason for Call Returning a Call from the Office Initial [place] states she is returning a call from the office. Additional Comment Office hours provided. Disp. Time Disposition Final User 01/31/2021 5:09:19 PM General Information Provided Yes Uvaldo Rising Call Closed By: Uvaldo Rising Transaction Date/Time: 01/31/2021 5:05:57 PM (ET)

## 2021-02-04 ENCOUNTER — Telehealth: Payer: Self-pay

## 2021-02-04 NOTE — Telephone Encounter (Signed)
Pt had appt on 7/29 that needed rescheduling. Pt called back after we opened to reschedule. This has been taken care of.

## 2021-02-04 NOTE — Telephone Encounter (Signed)
[place] Night - Client Nonclinical Telephone Record  [facility] Client [place] Night - Client Client Site [place] Physician Ria Bush - MD Contact Type Call Who Is Calling Patient / Member / Family / Caregiver Caller Name [place] Phone Number [phone] Call Type Message Only Information Provided Reason for Call Returning a Call from the Office Initial [place] is returning a call from the office. Additional Comment Provided caller with office hours. Disp. Time Disposition Final User 02/03/2021 5:27:59 PM General Information Provided Yes [place], [place] Call Closed By: Shireen Quan Transaction Date/Time: 02/03/2021 5:25:06 PM (ET)

## 2021-02-04 NOTE — Telephone Encounter (Signed)
Pt aware. (See Labs, Result Notes, 01/30/21)

## 2021-02-07 ENCOUNTER — Encounter: Payer: Medicare HMO | Admitting: Family Medicine

## 2021-02-12 ENCOUNTER — Ambulatory Visit (INDEPENDENT_AMBULATORY_CARE_PROVIDER_SITE_OTHER): Payer: Medicare HMO | Admitting: Family Medicine

## 2021-02-12 ENCOUNTER — Encounter: Payer: Self-pay | Admitting: Family Medicine

## 2021-02-12 ENCOUNTER — Telehealth: Payer: Self-pay | Admitting: Family Medicine

## 2021-02-12 ENCOUNTER — Other Ambulatory Visit: Payer: Self-pay

## 2021-02-12 VITALS — BP 138/70 | HR 86 | Temp 97.6°F | Ht 60.0 in | Wt 86.4 lb

## 2021-02-12 DIAGNOSIS — M79671 Pain in right foot: Secondary | ICD-10-CM | POA: Diagnosis not present

## 2021-02-12 DIAGNOSIS — D126 Benign neoplasm of colon, unspecified: Secondary | ICD-10-CM

## 2021-02-12 DIAGNOSIS — R634 Abnormal weight loss: Secondary | ICD-10-CM

## 2021-02-12 DIAGNOSIS — E785 Hyperlipidemia, unspecified: Secondary | ICD-10-CM

## 2021-02-12 DIAGNOSIS — E038 Other specified hypothyroidism: Secondary | ICD-10-CM

## 2021-02-12 DIAGNOSIS — Z0000 Encounter for general adult medical examination without abnormal findings: Secondary | ICD-10-CM | POA: Diagnosis not present

## 2021-02-12 DIAGNOSIS — R739 Hyperglycemia, unspecified: Secondary | ICD-10-CM | POA: Diagnosis not present

## 2021-02-12 DIAGNOSIS — Z7189 Other specified counseling: Secondary | ICD-10-CM

## 2021-02-12 DIAGNOSIS — I10 Essential (primary) hypertension: Secondary | ICD-10-CM | POA: Diagnosis not present

## 2021-02-12 DIAGNOSIS — E538 Deficiency of other specified B group vitamins: Secondary | ICD-10-CM | POA: Diagnosis not present

## 2021-02-12 DIAGNOSIS — M810 Age-related osteoporosis without current pathological fracture: Secondary | ICD-10-CM

## 2021-02-12 DIAGNOSIS — M79672 Pain in left foot: Secondary | ICD-10-CM

## 2021-02-12 DIAGNOSIS — N1831 Chronic kidney disease, stage 3a: Secondary | ICD-10-CM | POA: Diagnosis not present

## 2021-02-12 DIAGNOSIS — Z23 Encounter for immunization: Secondary | ICD-10-CM | POA: Diagnosis not present

## 2021-02-12 DIAGNOSIS — D649 Anemia, unspecified: Secondary | ICD-10-CM

## 2021-02-12 DIAGNOSIS — R636 Underweight: Secondary | ICD-10-CM

## 2021-02-12 LAB — POCT GLYCOSYLATED HEMOGLOBIN (HGB A1C): Hemoglobin A1C: 5.3 % (ref 4.0–5.6)

## 2021-02-12 MED ORDER — POTASSIUM CHLORIDE ER 10 MEQ PO TBCR: 10.0000 meq | EXTENDED_RELEASE_TABLET | Freq: Every day | ORAL | 3 refills | Status: DC

## 2021-02-12 MED ORDER — VITAMIN B-12 500 MCG PO TABS: 500.0000 ug | ORAL_TABLET | ORAL | Status: DC

## 2021-02-12 MED ORDER — COLCHICINE 0.6 MG PO TABS: 0.6000 mg | ORAL_TABLET | Freq: Every day | ORAL | 1 refills | Status: DC | PRN

## 2021-02-12 MED ORDER — ENALAPRIL MALEATE 20 MG PO TABS: 20.0000 mg | ORAL_TABLET | Freq: Every day | ORAL | 3 refills | Status: DC

## 2021-02-12 MED ORDER — HYDROCHLOROTHIAZIDE 25 MG PO TABS: 25.0000 mg | ORAL_TABLET | Freq: Every day | ORAL | 3 refills | Status: DC

## 2021-02-12 NOTE — Assessment & Plan Note (Addendum)
Unable to tolerate oral or IV bisphosphonates.  Will price out proila again.  Update DEXA scan.

## 2021-02-12 NOTE — Patient Instructions (Addendum)
Pneumovax today (pneumonia shot). If interested, check with pharmacy about new 2 shot shingles series (shingrix).  Return to dentist to discuss dentures and seal/adherence.  Call to schedule mammogram at your convenience: [facility] in [place] [phone] See if you can get bone density scan done at the same time.  Increase walking in good weather.  Your sugars were elevated - check A1c today.  Increase fatty fish in the diet.  I'd like to check abdominal ultrasound  Return in 6 months for follow up visit   Health Maintenance After Age 80 After age 56, you are at a higher risk for certain long-term diseases and infections as well as injuries from falls. Falls are a major cause of broken bones and head injuries in people who are older than age 80. Getting regular preventive care can help to keep you healthy and well. Preventive care includes getting regular testing and making lifestyle changes as recommended by your health care provider. Talk with your health care provider about: Which screenings and tests you should have. A screening is a test that checks for a disease when you have no symptoms. A diet and exercise plan that is right for you. What should I know about screenings and tests to prevent falls? Screening and testing are the best ways to find a health problem early. Early diagnosis and treatment give you the best chance of managing medical conditions that are common after age 80. Certain conditions and lifestyle choices may make you more likely to have a fall. Your health care provider may recommend: Regular vision checks. Poor vision and conditions such as cataracts can make you more likely to have a fall. If you wear glasses, make sure to get your prescription updated if your vision changes. Medicine review. Work with your health care provider to regularly review all of the medicines you are taking, including over-the-counter medicines. Ask your health care provider about  any side effects that may make you more likely to have a fall. Tell your health care provider if any medicines that you take make you feel dizzy or sleepy. Osteoporosis screening. Osteoporosis is a condition that causes the bones to get weaker. This can make the bones weak and cause them to break more easily. Blood pressure screening. Blood pressure changes and medicines to control blood pressure can make you feel dizzy. Strength and balance checks. Your health care provider may recommend certain tests to check your strength and balance while standing, walking, or changing positions. Foot health exam. Foot pain and numbness, as well as not wearing proper footwear, can make you more likely to have a fall. Depression screening. You may be more likely to have a fall if you have a fear of falling, feel emotionally low, or feel unable to do activities that you used to do. Alcohol use screening. Using too much alcohol can affect your balance and may make you more likely to have a fall. What actions can I take to lower my risk of falls? General instructions Talk with your health care provider about your risks for falling. Tell your health care provider if: You fall. Be sure to tell your health care provider about all falls, even ones that seem minor. You feel dizzy, sleepy, or off-balance. Take over-the-counter and prescription medicines only as told by your health care provider. These include any supplements. Eat a healthy diet and maintain a healthy weight. A healthy diet includes low-fat dairy products, low-fat (lean) meats, and fiber from whole grains, beans, and  lots of fruits and vegetables. Home safety Remove any tripping hazards, such as rugs, cords, and clutter. Install safety equipment such as grab bars in bathrooms and safety rails on stairs. Keep rooms and walkways well-lit. Activity  Follow a regular exercise program to stay fit. This will help you maintain your balance. Ask your health  care provider what types of exercise are appropriate for you. If you need a cane or walker, use it as recommended by your health care provider. Wear supportive shoes that have nonskid soles.  Lifestyle Do not drink alcohol if your health care provider tells you not to drink. If you drink alcohol, limit how much you have: 0-1 drink a day for women. 0-2 drinks a day for men. Be aware of how much alcohol is in your drink. In the [place] one drink equals one typical bottle of beer (12 oz), one-half glass of wine (5 oz), or one shot of hard liquor (1 oz). Do not use any products that contain nicotine or tobacco, such as cigarettes and e-cigarettes. If you need help quitting, ask your health care provider. Summary Having a healthy lifestyle and getting preventive care can help to protect your health and wellness after age 80. Screening and testing are the best way to find a health problem early and help you avoid having a fall. Early diagnosis and treatment give you the best chance for managing medical conditions that are more common for people who are older than age 80. Falls are a major cause of broken bones and head injuries in people who are older than age 80. Take precautions to prevent a fall at home. Work with your health care provider to learn what changes you can make to improve your health and wellness and to prevent falls. This information is not intended to replace advice given to you by your health care provider. Make sure you discuss any questions you have with your healthcare provider. Document Revised: 06/14/2020 Document Reviewed: 06/14/2020 Elsevier Patient Education  2022 [employer].

## 2021-02-12 NOTE — Telephone Encounter (Signed)
Can we price out proila for patient? Bisphosphonate intolerance

## 2021-02-12 NOTE — Assessment & Plan Note (Addendum)
Preventative protocols reviewed and updated unless pt declined. Discussed healthy diet and lifestyle.  # provided to schedule mammogram and DEXA at [facility].
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
## 2021-02-12 NOTE — Assessment & Plan Note (Addendum)
Advanced directive discussion - advanced directive packet provided previously. She has talked with family. Daughter [name] would be HCPOA. Doesn't want prolonged life support if terminal condition.

## 2021-02-13 NOTE — Assessment & Plan Note (Signed)
Thyroid function stable

## 2021-02-13 NOTE — Assessment & Plan Note (Signed)
Presumed gout related, managed with PRN colchicine.

## 2021-02-13 NOTE — Assessment & Plan Note (Addendum)
Chronic, off statin. Encouraged increased fatty fish in diet.  The 10-year ASCVD risk score Mikey Bussing DC Brooke Bonito., et al., 2013) is: 21.9%   Values used to calculate the score:     Age: 80 years     Sex: Female     Is Non-Hispanic African American: Yes     Diabetic: No     Tobacco smoker: No     Systolic Blood Pressure: 0000000 mmHg     Is BP treated: Yes     HDL Cholesterol: 84 mg/dL     Total Cholesterol: 169 mg/dL

## 2021-02-13 NOTE — Assessment & Plan Note (Signed)
Chronic, stable on enalapril and hctz + Kdur 68mq daily.

## 2021-02-13 NOTE — Assessment & Plan Note (Signed)
Maintaining levels on once weekly oral b12

## 2021-02-13 NOTE — Assessment & Plan Note (Addendum)
This persists. Encouraged good hydration status and avoiding NSAIDs

## 2021-02-13 NOTE — Assessment & Plan Note (Signed)
Latest colonoscopy 03/2019 - 1 TA, no f/u planned

## 2021-02-13 NOTE — Assessment & Plan Note (Addendum)
Has not regained any weight, actually down another 2 lbs. Attributed to ongoing issues with partial dentures - encouraged f/u with dentist. She has seen nutritionist and declines orexigenic agent. Encouraged regular protein supplement use ie boost/ensure. With new hyperglycemia, check abd [place] eval pancreas. Denies red flags of epigastric pain boring to back, early satiety, dysphagia.

## 2021-02-13 NOTE — Assessment & Plan Note (Addendum)
Chronic normocytic anemia with elevated ferritin levels. B12 levels normal.. She is off oral iron replacement.

## 2021-02-14 NOTE — Telephone Encounter (Signed)
Sending to CMA helping with prolia now

## 2021-02-19 NOTE — Telephone Encounter (Signed)
Prolia VOB initiated via [email or web address]  Last OV: 02/12/21 Next OV: 08/22/21 Last Prolia inj: NEW START Next Prolia inj DUE:

## 2021-02-21 ENCOUNTER — Other Ambulatory Visit: Payer: Self-pay

## 2021-02-21 ENCOUNTER — Ambulatory Visit
Admission: RE | Admit: 2021-02-21 | Discharge: 2021-02-21 | Disposition: A | Discharge status: Home / Self Care | Payer: Medicare HMO | Source: Ambulatory Visit | Attending: Family Medicine | Admitting: Family Medicine

## 2021-02-21 DIAGNOSIS — R739 Hyperglycemia, unspecified: Secondary | ICD-10-CM | POA: Diagnosis not present

## 2021-02-21 DIAGNOSIS — K828 Other specified diseases of gallbladder: Secondary | ICD-10-CM | POA: Diagnosis not present

## 2021-02-21 DIAGNOSIS — R634 Abnormal weight loss: Secondary | ICD-10-CM | POA: Diagnosis not present

## 2021-03-01 NOTE — Telephone Encounter (Addendum)
PA initiated via [email or web address]

## 2021-03-06 NOTE — Telephone Encounter (Signed)
PA approved 03/01/21-03/01/2022. Auth # [id number]  Waiting to get feedback from [facility] on the cost and then will contact patient.

## 2021-03-06 NOTE — Telephone Encounter (Signed)
Pt ready for scheduling on or after 03/06/21  Out-of-pocket cost due at time of visit: $286.55  Primary: [employer] Prolia co-insurance: 20% ($272.40) Admin fee co-insurance: 20% ($14.15)  Secondary: n/a Prolia co-insurance:  Admin fee co-insurance:   Deductible: does not apply  Prior Auth: APPROVED PA# [id number] Valid: 03/01/21-03/01/22   *This is an estimated out-of-pocket cost

## 2021-03-07 NOTE — Telephone Encounter (Signed)
Patient advised. Per Kerin Perna cost estimate is [phone] (prices went up). Patient will speak with her daughter and will let us know if she would like to proceed.

## 2021-03-11 ENCOUNTER — Other Ambulatory Visit: Payer: Self-pay | Admitting: Family Medicine

## 2021-03-11 DIAGNOSIS — M810 Age-related osteoporosis without current pathological fracture: Secondary | ICD-10-CM | POA: Diagnosis not present

## 2021-03-11 DIAGNOSIS — Z1231 Encounter for screening mammogram for malignant neoplasm of breast: Secondary | ICD-10-CM | POA: Diagnosis not present

## 2021-03-11 LAB — HM MAMMOGRAPHY

## 2021-03-12 NOTE — Telephone Encounter (Signed)
Left message for patient to call me back to follow up on her decision

## 2021-03-12 NOTE — Telephone Encounter (Signed)
Colchicine Last filled:  02/12/21, #30 Last OV:  02/12/21, AWV prt 2 Next OV:  08/22/21, 6 mo f/u

## 2021-03-12 NOTE — Telephone Encounter (Signed)
Mrs. Tirah called in and returning phone call. She is not going to do it.

## 2021-03-13 NOTE — Telephone Encounter (Signed)
Spoke with pt relaying Dr. G's message.  Pt verbalizes understanding and expresses her thanks.  

## 2021-03-13 NOTE — Telephone Encounter (Signed)
Plz notify bone density scan actually returned showing significant improvement across the board!  Ok to just monitor with calcium, vitamin D, and regular weight bearing exercises over next 2 years, will reassess at 2 yr f/u DEXA

## 2021-03-14 ENCOUNTER — Encounter: Payer: Self-pay | Admitting: Family Medicine

## 2021-03-20 ENCOUNTER — Other Ambulatory Visit: Payer: Self-pay | Admitting: Family Medicine

## 2021-03-20 NOTE — Telephone Encounter (Signed)
Ok to send 90-day rx?

## 2021-08-22 ENCOUNTER — Ambulatory Visit (INDEPENDENT_AMBULATORY_CARE_PROVIDER_SITE_OTHER): Payer: Medicare HMO | Admitting: Family Medicine

## 2021-08-22 ENCOUNTER — Other Ambulatory Visit: Payer: Medicare HMO

## 2021-08-22 ENCOUNTER — Other Ambulatory Visit: Payer: Self-pay

## 2021-08-22 ENCOUNTER — Telehealth: Payer: Self-pay | Admitting: Radiology

## 2021-08-22 ENCOUNTER — Encounter: Payer: Self-pay | Admitting: Family Medicine

## 2021-08-22 VITALS — BP 122/68 | HR 89 | Temp 97.3°F | Ht 60.0 in | Wt 83.2 lb

## 2021-08-22 DIAGNOSIS — R634 Abnormal weight loss: Secondary | ICD-10-CM

## 2021-08-22 DIAGNOSIS — D72819 Decreased white blood cell count, unspecified: Secondary | ICD-10-CM

## 2021-08-22 DIAGNOSIS — M810 Age-related osteoporosis without current pathological fracture: Secondary | ICD-10-CM | POA: Diagnosis not present

## 2021-08-22 DIAGNOSIS — R7989 Other specified abnormal findings of blood chemistry: Secondary | ICD-10-CM | POA: Diagnosis not present

## 2021-08-22 DIAGNOSIS — R6889 Other general symptoms and signs: Secondary | ICD-10-CM

## 2021-08-22 DIAGNOSIS — N1831 Chronic kidney disease, stage 3a: Secondary | ICD-10-CM | POA: Diagnosis not present

## 2021-08-22 DIAGNOSIS — E871 Hypo-osmolality and hyponatremia: Secondary | ICD-10-CM

## 2021-08-22 DIAGNOSIS — D649 Anemia, unspecified: Secondary | ICD-10-CM

## 2021-08-22 DIAGNOSIS — R339 Retention of urine, unspecified: Secondary | ICD-10-CM

## 2021-08-22 DIAGNOSIS — R3915 Urgency of urination: Secondary | ICD-10-CM | POA: Insufficient documentation

## 2021-08-22 DIAGNOSIS — D709 Neutropenia, unspecified: Secondary | ICD-10-CM | POA: Diagnosis not present

## 2021-08-22 DIAGNOSIS — Z8679 Personal history of other diseases of the circulatory system: Secondary | ICD-10-CM | POA: Diagnosis not present

## 2021-08-22 LAB — POC URINALSYSI DIPSTICK (AUTOMATED)
Bilirubin, UA: NEGATIVE
Blood, UA: NEGATIVE
Glucose, UA: NEGATIVE
Ketones, UA: NEGATIVE
Leukocytes, UA: NEGATIVE
Nitrite, UA: NEGATIVE
Protein, UA: NEGATIVE
Spec Grav, UA: 1.01 (ref 1.010–1.025)
Urobilinogen, UA: 0.2 E.U./dL
pH, UA: 6 (ref 5.0–8.0)

## 2021-08-22 LAB — CBC WITH DIFFERENTIAL/PLATELET
Basophils Absolute: 0 10*3/uL (ref 0.0–0.1)
Basophils Relative: 0.7 % (ref 0.0–3.0)
Eosinophils Absolute: 0 10*3/uL (ref 0.0–0.7)
Eosinophils Relative: 0.5 % (ref 0.0–5.0)
HCT: 29.7 % — ABNORMAL LOW (ref 36.0–46.0)
Hemoglobin: 9.8 g/dL — ABNORMAL LOW (ref 12.0–15.0)
Lymphocytes Relative: 29.2 % (ref 12.0–46.0)
Lymphs Abs: 0.5 10*3/uL — ABNORMAL LOW (ref 0.7–4.0)
MCHC: 32.9 g/dL (ref 30.0–36.0)
MCV: 91 fl (ref 78.0–100.0)
Monocytes Absolute: 0.4 10*3/uL (ref 0.1–1.0)
Monocytes Relative: 22.2 % — ABNORMAL HIGH (ref 3.0–12.0)
Neutro Abs: 0.9 10*3/uL — ABNORMAL LOW (ref 1.4–7.7)
Neutrophils Relative %: 47.4 % (ref 43.0–77.0)
Platelets: 156 10*3/uL (ref 150.0–400.0)
RBC: 3.27 Mil/uL — ABNORMAL LOW (ref 3.87–5.11)
RDW: 12.9 % (ref 11.5–15.5)
WBC: 1.9 10*3/uL — CL (ref 4.0–10.5)

## 2021-08-22 LAB — IBC PANEL
Iron: 95 ug/dL (ref 42–145)
Saturation Ratios: 35 % (ref 20.0–50.0)
TIBC: 271.6 ug/dL (ref 250.0–450.0)
Transferrin: 194 mg/dL — ABNORMAL LOW (ref 212.0–360.0)

## 2021-08-22 LAB — RENAL FUNCTION PANEL
Albumin: 3.9 g/dL (ref 3.5–5.2)
BUN: 21 mg/dL (ref 6–23)
CO2: 28 mEq/L (ref 19–32)
Calcium: 9 mg/dL (ref 8.4–10.5)
Chloride: 94 mEq/L — ABNORMAL LOW (ref 96–112)
Creatinine, Ser: 1.1 mg/dL (ref 0.40–1.20)
GFR: 47.45 mL/min — ABNORMAL LOW (ref >60.00)
Glucose, Bld: 119 mg/dL — ABNORMAL HIGH (ref 70–99)
Phosphorus: 3 mg/dL (ref 2.3–4.6)
Potassium: 3.9 mEq/L (ref 3.5–5.1)
Sodium: 128 mEq/L — ABNORMAL LOW (ref 135–145)

## 2021-08-22 LAB — FERRITIN: Ferritin: 554.6 ng/mL — ABNORMAL HIGH (ref 10.0–291.0)

## 2021-08-22 LAB — VITAMIN D 25 HYDROXY (VIT D DEFICIENCY, FRACTURES): VITD: 88.24 ng/mL (ref 30.00–100.00)

## 2021-08-22 MED ORDER — MIRTAZAPINE 15 MG PO TABS: 15.0000 mg | ORAL_TABLET | Freq: Every day | ORAL | 6 refills | Status: DC

## 2021-08-22 NOTE — Telephone Encounter (Addendum)
Lvm asking pt to call back. Need to relay Dr. G's message and get answer to his question.  

## 2021-08-22 NOTE — Progress Notes (Signed)
Patient ID: Margaret Nguyen, female    DOB: 01-15-1941, 81 y.o.   MRN: [id number]  This visit was conducted in person.  BP 122/68    Pulse 89    Temp (!) 97.3 F (36.3 C) (Temporal)    Ht 5' (1.524 m)    Wt 83 lb 4 oz (37.8 kg)    SpO2 98%    BMI 16.26 kg/m    CC: 6 mo f/u visit  Subjective:   HPI: Margaret Nguyen is a 81 y.o. female presenting on 08/22/2021 for Follow-up (Here for 6 mo f/u.)   Pt requests handicap placard filled out - states difficulty walking 200 ft without stopping to rest, normally uses cane to assist with ambulation.   1 wk h/o decreased urine output, urinary urgency and frequency, R suprapubic sharp pain x1, now resolved.  No dysuria, hematuria, fevers/chills, nausea/vomiting, flank pain.  No recent UTI.  No recent abx use.   Weight loss over the past year - peak 100lbs. Attributed to dental trouble - has partial lower dentures and full upper dentures, h/o gingivitis - still having trouble eating. She is no longer seeing dentist. She is drinking 2 ensure a day. Continues struggling with nutrition. Saw nutritionist 08/2020, note reviewed. Has declined orexigenic agent.   Osteoporosis - unable to tolerate oral bisphosphonates. Prolia expensive, declined by patient.  DEXA 02/2021 - T score +2.4 spine, R femur neck -1.6 improved bone density consider rpt 2 yrs.  Will just continue cal, vit d, regular weight bearing exercise and reassess at 2 yr f/u.      Relevant past medical, surgical, family and social history reviewed and updated as indicated. Interim medical history since our last visit reviewed. Allergies and medications reviewed and updated. Outpatient Medications Prior to Visit  Medication Sig Dispense Refill   acetaminophen (TYLENOL) 500 MG tablet Take 1 tablet (500 mg total) by mouth every 6 (six) hours as needed. 30 tablet 0   Calcium Carb-Cholecalciferol 500-400 MG-UNIT TABS Take by mouth.     colchicine 0.6 MG tablet TAKE 1 TABLET (0.6 MG  TOTAL) BY MOUTH DAILY AS NEEDED (GOUT FLARE). 90 tablet 1   enalapril (VASOTEC) 20 MG tablet Take 1 tablet (20 mg total) by mouth daily. 90 tablet 3   hydrochlorothiazide (HYDRODIURIL) 25 MG tablet Take 1 tablet (25 mg total) by mouth daily. 90 tablet 3   Multiple Vitamins-Minerals (CENTRUM PO) Take by mouth.     potassium chloride (KLOR-CON) 10 MEQ tablet Take 1 tablet (10 mEq total) by mouth daily. 90 tablet 3   vitamin B-12 (CYANOCOBALAMIN) 500 MCG tablet Take 1 tablet (500 mcg total) by mouth once a week.     vitamin C (ASCORBIC ACID) 500 MG tablet Take 500 mg by mouth daily.     No facility-administered medications prior to visit.     Per HPI unless specifically indicated in ROS section below Review of Systems  Objective:  BP 122/68    Pulse 89    Temp (!) 97.3 F (36.3 C) (Temporal)    Ht 5' (1.524 m)    Wt 83 lb 4 oz (37.8 kg)    SpO2 98%    BMI 16.26 kg/m   Wt Readings from Last 3 Encounters:  08/22/21 83 lb 4 oz (37.8 kg)  02/12/21 86 lb 7 oz (39.2 kg)  09/09/20 86 lb 3.2 oz (39.1 kg)      Physical Exam Vitals and nursing note reviewed.  Constitutional:  Appearance: Normal appearance. She is not ill-appearing.     Comments: Walks unassisted today   Eyes:     Extraocular Movements: Extraocular movements intact.     Conjunctiva/sclera: Conjunctivae normal.     Pupils: Pupils are equal, round, and reactive to light.  Neck:     Thyroid: No thyroid mass or thyromegaly.  Cardiovascular:     Rate and Rhythm: Normal rate. Rhythm irregularly irregular.     Pulses: Normal pulses.     Heart sounds: Normal heart sounds. No murmur heard. Pulmonary:     Effort: Pulmonary effort is normal. No respiratory distress.     Breath sounds: Normal breath sounds. No wheezing, rhonchi or rales.  Abdominal:     General: Bowel sounds are normal. There is no distension.     Palpations: Abdomen is soft. There is no mass.     Tenderness: There is no abdominal tenderness. There is no right  CVA tenderness, left CVA tenderness, guarding or rebound.     Hernia: No hernia is present.  Musculoskeletal:     Cervical back: Normal range of motion and neck supple.     Right lower leg: No edema.     Left lower leg: No edema.  Skin:    General: Skin is warm and dry.     Findings: No rash.  Neurological:     Mental Status: She is alert.  Psychiatric:        Mood and Affect: Mood normal.        Behavior: Behavior normal.      Results for orders placed or performed in visit on 08/22/21  VITAMIN D 25 Hydroxy (Vit-D Deficiency, Fractures)  Result Value Ref Range   VITD 88.24 30.00 - 100.00 ng/mL  Renal function panel  Result Value Ref Range   Sodium 128 (L) 135 - 145 mEq/L   Potassium 3.9 3.5 - 5.1 mEq/L   Chloride 94 (L) 96 - 112 mEq/L   CO2 28 19 - 32 mEq/L   Albumin 3.9 3.5 - 5.2 g/dL   BUN 21 6 - 23 mg/dL   Creatinine, Ser 1.10 0.40 - 1.20 mg/dL   Glucose, Bld 119 (H) 70 - 99 mg/dL   Phosphorus 3.0 2.3 - 4.6 mg/dL   GFR 47.45 (L) >60.00 mL/min   Calcium 9.0 8.4 - 10.5 mg/dL  CBC with Differential/Platelet  Result Value Ref Range   WBC 1.9 Repeated and verified X2. (LL) 4.0 - 10.5 K/uL   RBC 3.27 (L) 3.87 - 5.11 Mil/uL   Hemoglobin 9.8 (L) 12.0 - 15.0 g/dL   HCT 29.7 (L) 36.0 - 46.0 %   MCV 91.0 78.0 - 100.0 fl   MCHC 32.9 30.0 - 36.0 g/dL   RDW 12.9 11.5 - 15.5 %   Platelets 156.0 150.0 - 400.0 K/uL   Neutrophils Relative % 47.4 43.0 - 77.0 %   Lymphocytes Relative 29.2 12.0 - 46.0 %   Monocytes Relative 22.2 Repeated and verified X2. (H) 3.0 - 12.0 %   Eosinophils Relative 0.5 0.0 - 5.0 %   Basophils Relative 0.7 0.0 - 3.0 %   Neutro Abs 0.9 (L) 1.4 - 7.7 K/uL   Lymphs Abs 0.5 (L) 0.7 - 4.0 K/uL   Monocytes Absolute 0.4 0.1 - 1.0 K/uL   Eosinophils Absolute 0.0 0.0 - 0.7 K/uL   Basophils Absolute 0.0 0.0 - 0.1 K/uL  IBC panel  Result Value Ref Range   Iron 95 42 - 145 ug/dL   Transferrin 194.0 (  L) 212.0 - 360.0 mg/dL   Saturation Ratios 35.0 20.0 - 50.0 %    TIBC 271.6 250.0 - 450.0 mcg/dL  Ferritin  Result Value Ref Range   Ferritin 554.6 (H) 10.0 - 291.0 ng/mL  POCT Urinalysis Dipstick (Automated)  Result Value Ref Range   Color, UA yellow    Clarity, UA clear    Glucose, UA Negative Negative   Bilirubin, UA negative    Ketones, UA negative    Spec Grav, UA 1.010 1.010 - 1.025   Blood, UA negative    pH, UA 6.0 5.0 - 8.0   Protein, UA Negative Negative   Urobilinogen, UA 0.2 0.2 or 1.0 E.U./dL   Nitrite, UA negative    Leukocytes, UA Negative Negative   Lab Results  Component Value Date   TSH 3.98 01/30/2021   EKG - sinus arrhythmia rate 90, normal axis, intervals, no hypertrophy or acute ST/T changes   Assessment & Plan:  This visit occurred during the SARS-CoV-2 public health emergency.  Safety protocols were in place, including screening questions prior to the visit, additional usage of staff PPE, and extensive cleaning of exam room while observing appropriate contact time as indicated for disinfecting solutions.   Problem List Items Addressed This Visit     Osteoporosis    Reviewed latest DEXA showing improved control - will monitor on cal/vit D and regular weight bearing, with planned reassessment 02/2023.       Unintentional weight loss    Another 3 pounds down since 02/2021, peak weight 100 lbs. Previously attributed to dental issues. Not planning to return to dentist at this time.  She continues drinking ensure Discussed orexigenic agent - will start mirtazapine 15mg  nightly.       CKD (chronic kidney disease) stage 3, GFR 30-59 ml/min (HCC)    Update labs today. Latest GFR 45. With CKD and anemia, check SPEP reflex IFE.       Relevant Orders   VITAMIN D 25 Hydroxy (Vit-D Deficiency, Fractures) (Completed)   Renal function panel (Completed)   CBC with Differential/Platelet (Completed)   Serum protein electrophoresis with reflex   Urinary urgency    Notes recently increased urinary urgency with some incomplete  emptying.  Check UA today - overall clear without evidence of UTI.  Encouraged limiting caffeine and other bladder irritants.       History of rheumatic fever    Endorses h/o this.  No significant murmur appreciated today.       Relevant Orders   EKG 12-Lead (Completed)   Leukopenia - Primary    Marked leukopenia with anemia and platelet count has also dropped to low normal ranges. Will send blood for pathologist review, await SPEP.  Given marked leukopenia, also will refer to hematology for further evaluation.  Denies consitutional symptoms. Red flags to seek urgent care reviewed.       Relevant Orders   Pathologist smear review   Elevated ferritin    Present for the past almost 2 yrs, of unclear cause. Will refer to hematology for further evaluation.      Relevant Orders   IBC panel (Completed)   Ferritin (Completed)   Pathologist smear review   Hyponatremia    New - possibly med related (HCTZ). Will recommend hold hctz or at least drop dose to 1/2 tab daily, recheck labs in 2 weeks.       Decreased exercise tolerance    Notes difficulty at times getting to the grocery store, requests we fill  out application for handicap placard - done.          Meds ordered this encounter  Medications   mirtazapine (REMERON) 15 MG tablet    Sig: Take 1 tablet (15 mg total) by mouth at bedtime.    Dispense:  30 tablet    Refill:  6   Orders Placed This Encounter  Procedures   VITAMIN D 25 Hydroxy (Vit-D Deficiency, Fractures)   Renal function panel   CBC with Differential/Platelet   IBC panel   Ferritin   Serum protein electrophoresis with reflex   Pathologist smear review    Standing Status:   Future    Number of Occurrences:   1    Standing Expiration Date:   08/22/2022   POCT Urinalysis Dipstick (Automated)   EKG 12-Lead     Patient Instructions  EKG today - no atrial fibrillation. Labs today.  Urinalysis today - looking ok today.  Start remeron (mirtazapine)  15mg  at night time to help boost appetite.  Increase water intake, let us know if worsening urinary symptoms.   Follow up plan: Return in about 6 months (around 02/19/2022) for annual exam, prior fasting for blood work.  Ria Bush, MD

## 2021-08-22 NOTE — Telephone Encounter (Signed)
Please call - her white cells were very low - I'm adding on another blood test to labs done this morning for further evaluation.  Has she noted any swollen glands, any fevers or night sweats or new fatigue?

## 2021-08-22 NOTE — Patient Instructions (Addendum)
EKG today - no atrial fibrillation. Labs today.  Urinalysis today - looking ok today.  Start remeron (mirtazapine) 15mg  at night time to help boost appetite.  Increase water intake, let us know if worsening urinary symptoms.

## 2021-08-22 NOTE — Telephone Encounter (Signed)
[facility] called a critical, WBC - 1.9. Results given to Dr Danise Mina

## 2021-08-23 ENCOUNTER — Other Ambulatory Visit: Payer: Self-pay | Admitting: Family Medicine

## 2021-08-23 DIAGNOSIS — E871 Hypo-osmolality and hyponatremia: Secondary | ICD-10-CM

## 2021-08-23 DIAGNOSIS — D72819 Decreased white blood cell count, unspecified: Secondary | ICD-10-CM | POA: Insufficient documentation

## 2021-08-23 DIAGNOSIS — R7989 Other specified abnormal findings of blood chemistry: Secondary | ICD-10-CM | POA: Insufficient documentation

## 2021-08-23 DIAGNOSIS — R6889 Other general symptoms and signs: Secondary | ICD-10-CM | POA: Insufficient documentation

## 2021-08-23 NOTE — Assessment & Plan Note (Signed)
New - possibly med related (HCTZ). Will recommend hold hctz or at least drop dose to 1/2 tab daily, recheck labs in 2 weeks.

## 2021-08-23 NOTE — Addendum Note (Signed)
Addended by: Ria Bush on: 08/23/2021 01:44 AM   Modules accepted: Orders

## 2021-08-23 NOTE — Assessment & Plan Note (Signed)
Endorses h/o this.  No significant murmur appreciated today.

## 2021-08-23 NOTE — Assessment & Plan Note (Signed)
Present for the past almost 2 yrs, of unclear cause. Will refer to hematology for further evaluation.

## 2021-08-23 NOTE — Assessment & Plan Note (Signed)
Another 3 pounds down since 02/2021, peak weight 100 lbs. Previously attributed to dental issues. Not planning to return to dentist at this time.  She continues drinking ensure Discussed orexigenic agent - will start mirtazapine 15mg  nightly.

## 2021-08-23 NOTE — Assessment & Plan Note (Signed)
Update labs today. Latest GFR 45. With CKD and anemia, check SPEP reflex IFE.

## 2021-08-23 NOTE — Assessment & Plan Note (Signed)
Notes recently increased urinary urgency with some incomplete emptying.  Check UA today - overall clear without evidence of UTI.  Encouraged limiting caffeine and other bladder irritants.

## 2021-08-23 NOTE — Assessment & Plan Note (Signed)
Notes difficulty at times getting to the grocery store, requests we fill out application for handicap placard - done.

## 2021-08-23 NOTE — Assessment & Plan Note (Addendum)
Marked leukopenia with anemia and platelet count has also dropped to low normal ranges. Will send blood for pathologist review, await SPEP.  Given marked leukopenia, also will refer to hematology for further evaluation.  Denies consitutional symptoms. Red flags to seek urgent care reviewed.

## 2021-08-23 NOTE — Assessment & Plan Note (Signed)
Reviewed latest DEXA showing improved control - will monitor on cal/vit D and regular weight bearing, with planned reassessment 02/2023.

## 2021-08-23 NOTE — Assessment & Plan Note (Deleted)
Update thyroid function off replacement.

## 2021-08-25 LAB — PATHOLOGIST SMEAR REVIEW

## 2021-08-25 NOTE — Telephone Encounter (Signed)
[facility] Night - Client Nonclinical Telephone Record  [facility] Client [facility] Night - Client Client Site [place] Provider Ria Bush - MD Contact Type Call Who Is Calling Patient / Member / Family / Caregiver Caller Name Hubbard Phone Number [phone] Call Type Message Only Information Provided Reason for Call Returning a Call from the Office Initial [place] states that she is returning a call from the office. Additional Comment Office hours provided . Disp. Time Disposition Final User 08/22/2021 6:43:41 PM General Information Provided Yes Perla-Benitez, Jocelyn Call Closed By: Claris Gladden Transaction Date/Time: 08/22/2021 6:40:02 PM (ET)

## 2021-08-26 LAB — PROTEIN ELECTROPHORESIS, SERUM, WITH REFLEX
Albumin ELP: 4.1 g/dL (ref 3.8–4.8)
Alpha 1: 0.3 g/dL (ref 0.2–0.3)
Alpha 2: 0.6 g/dL (ref 0.5–0.9)
Beta 2: 0.4 g/dL (ref 0.2–0.5)
Beta Globulin: 0.4 g/dL (ref 0.4–0.6)
Gamma Globulin: 1.3 g/dL (ref 0.8–1.7)
Total Protein: 7.1 g/dL (ref 6.1–8.1)

## 2021-08-27 NOTE — Telephone Encounter (Signed)
Left a message on voicemail for patient to cal the office back.

## 2021-09-01 NOTE — Telephone Encounter (Signed)
Spoke with pt relaying lab results.  (See Lab Result Notes, 08/22/21).

## 2021-09-04 ENCOUNTER — Other Ambulatory Visit: Payer: Medicare HMO

## 2021-09-12 ENCOUNTER — Telehealth: Payer: Self-pay | Admitting: Family Medicine

## 2021-09-12 DIAGNOSIS — R220 Localized swelling, mass and lump, head: Secondary | ICD-10-CM

## 2021-09-12 NOTE — Telephone Encounter (Signed)
Pt called and would like a call back to discuss the visit she had from the ENT last night. Please advise. ?

## 2021-09-15 NOTE — Telephone Encounter (Signed)
Left message on answering machine for patient to call the office back. ?

## 2021-09-16 MED ORDER — LOSARTAN POTASSIUM 50 MG PO TABS: 50.0000 mg | ORAL_TABLET | Freq: Every day | ORAL | 1 refills | Status: DC

## 2021-09-16 NOTE — Telephone Encounter (Signed)
Called patient and was advised that she had to call EMT out the night before she called the office. Patient stated that her tongue was swelling. Patient stated that she ended up taking two benadryl which helped the swelling. Patient stated by the time the EMT's left the swelling was gone. Patient stated that she had for breakfast that morning some salmon. Patient stated a short while before her tongue starting swelling her had some cranberry juice. Patient stated that she did take a benadryl the day after this happened. Patient stated that she is doing just fine now. Patient stated that she is concerned and wants to know what she should do because she does not know exactly caused her tongue to swell. Patient was advised if she has this to happen again to call 911 or go to the ER immediately and she verbalized understanding.  ?

## 2021-09-16 NOTE — Telephone Encounter (Addendum)
Recommend avoid salmon and cranberry at this time. ?Would offer referral to allergist for further evaluation and to consider food allergy testing.  ?Agree with ER evaluation if recurrent tongue/lip swelling or any associated shortness of breath.  ?

## 2021-09-16 NOTE — Addendum Note (Signed)
Addended by: Ria Bush on: 09/16/2021 04:52 PM ? ? Modules accepted: Orders ? ?

## 2021-09-16 NOTE — Telephone Encounter (Addendum)
Referral placed.  ?Reviewing med list, she is on enalapril. This can sometimes be associated with tongue/lip swelling (as an allergy to medication). Recommend she stop this and in its place start losartan '50mg'$  daily sent to pharmacy. She should keep an eye on blood pressures with this med change, let us know if running too low <110 or too high >140 on top.  ?

## 2021-09-16 NOTE — Telephone Encounter (Signed)
Patient notified as instructed by telephone and verbalized understanding. Patient stated that she is willing to get a referral to see an allergist. Patient was advised that one of the referral coordinators will be in touch with her to get this set up. Patient was advised that it may take a few weeks to get an appointment set up and encouraged her to get one of her children to drive her for the appointment.  ?

## 2021-09-16 NOTE — Telephone Encounter (Signed)
Spoke with pt relaying Dr. G's message. Pt verbalizes understanding.  

## 2021-09-18 ENCOUNTER — Other Ambulatory Visit: Payer: Self-pay | Admitting: Family Medicine

## 2021-09-19 NOTE — Telephone Encounter (Signed)
Referral sent to [facility]. Placed on their WQ. They will call patient to schedule appt.  ? ?[facility] ?Address: [address] ?[place], [place] [address] ?Phone: [phone] ?

## 2021-09-26 ENCOUNTER — Ambulatory Visit (INDEPENDENT_AMBULATORY_CARE_PROVIDER_SITE_OTHER): Payer: Medicare HMO | Admitting: Family Medicine

## 2021-09-26 ENCOUNTER — Encounter: Payer: Self-pay | Admitting: Family Medicine

## 2021-09-26 ENCOUNTER — Other Ambulatory Visit: Payer: Self-pay

## 2021-09-26 VITALS — BP 154/70 | HR 93 | Temp 97.5°F | Ht 60.0 in | Wt 86.5 lb

## 2021-09-26 DIAGNOSIS — D72819 Decreased white blood cell count, unspecified: Secondary | ICD-10-CM | POA: Diagnosis not present

## 2021-09-26 DIAGNOSIS — E871 Hypo-osmolality and hyponatremia: Secondary | ICD-10-CM | POA: Diagnosis not present

## 2021-09-26 DIAGNOSIS — T783XXA Angioneurotic edema, initial encounter: Secondary | ICD-10-CM

## 2021-09-26 DIAGNOSIS — D649 Anemia, unspecified: Secondary | ICD-10-CM

## 2021-09-26 DIAGNOSIS — Z91013 Allergy to seafood: Secondary | ICD-10-CM | POA: Insufficient documentation

## 2021-09-26 DIAGNOSIS — R634 Abnormal weight loss: Secondary | ICD-10-CM

## 2021-09-26 DIAGNOSIS — E538 Deficiency of other specified B group vitamins: Secondary | ICD-10-CM | POA: Diagnosis not present

## 2021-09-26 DIAGNOSIS — I10 Essential (primary) hypertension: Secondary | ICD-10-CM

## 2021-09-26 LAB — CBC WITH DIFFERENTIAL/PLATELET
Basophils Absolute: 0 10*3/uL (ref 0.0–0.1)
Basophils Relative: 0.2 % (ref 0.0–3.0)
Eosinophils Absolute: 0 10*3/uL (ref 0.0–0.7)
Eosinophils Relative: 1.3 % (ref 0.0–5.0)
HCT: 30.7 % — ABNORMAL LOW (ref 36.0–46.0)
Hemoglobin: 10.2 g/dL — ABNORMAL LOW (ref 12.0–15.0)
Lymphocytes Relative: 22.3 % (ref 12.0–46.0)
Lymphs Abs: 0.7 10*3/uL (ref 0.7–4.0)
MCHC: 33.3 g/dL (ref 30.0–36.0)
MCV: 91.9 fl (ref 78.0–100.0)
Monocytes Absolute: 0.6 10*3/uL (ref 0.1–1.0)
Monocytes Relative: 19.2 % — ABNORMAL HIGH (ref 3.0–12.0)
Neutro Abs: 1.8 10*3/uL (ref 1.4–7.7)
Neutrophils Relative %: 57 % (ref 43.0–77.0)
Platelets: 208 10*3/uL (ref 150.0–400.0)
RBC: 3.34 Mil/uL — ABNORMAL LOW (ref 3.87–5.11)
RDW: 13.8 % (ref 11.5–15.5)
WBC: 3.2 10*3/uL — ABNORMAL LOW (ref 4.0–10.5)

## 2021-09-26 LAB — BASIC METABOLIC PANEL
BUN: 39 mg/dL — ABNORMAL HIGH (ref 6–23)
CO2: 29 mEq/L (ref 19–32)
Calcium: 9.4 mg/dL (ref 8.4–10.5)
Chloride: 96 mEq/L (ref 96–112)
Creatinine, Ser: 1.21 mg/dL — ABNORMAL HIGH (ref 0.40–1.20)
GFR: 42.3 mL/min — ABNORMAL LOW (ref >60.00)
Glucose, Bld: 100 mg/dL — ABNORMAL HIGH (ref 70–99)
Potassium: 4.1 mEq/L (ref 3.5–5.1)
Sodium: 134 mEq/L — ABNORMAL LOW (ref 135–145)

## 2021-09-26 LAB — VITAMIN B12: Vitamin B-12: 400 pg/mL (ref 211–911)

## 2021-09-26 NOTE — Assessment & Plan Note (Signed)
Weight stabilizing on remeron nightly - continue.  ?

## 2021-09-26 NOTE — Patient Instructions (Addendum)
Labs today.  ?Call cancer center at [facility] [phone] to schedule an appointment with blood doctor.  ?Keep allergist appointment 11/2021.  ?Schedule physical after 02/12/2022.  ?Buy blood pressure cuff at pharmacy - prescription provided today.  ?Good to see you today. Call us with questions.  ?

## 2021-09-26 NOTE — Assessment & Plan Note (Signed)
Update Na on lower HCTZ dose. ?

## 2021-09-26 NOTE — Assessment & Plan Note (Addendum)
Likely due to ACEI - now off this, started losartan '50mg'$  in its place. Discussed ACEI induced angioedema. She also had recently had salmon and cranberry juice earlier that day. Pending allergy evaluation.  ?

## 2021-09-26 NOTE — Assessment & Plan Note (Addendum)
New, with reassuring peripheral smear - pending hematology evaluation. Update CBC today. ?

## 2021-09-26 NOTE — Assessment & Plan Note (Signed)
Persistent - pending hematology evaluation ?

## 2021-09-26 NOTE — Progress Notes (Signed)
? ? Patient ID: Margaret Nguyen, female    DOB: March 28, 1941, 81 y.o.   MRN: [id number] ? ?This visit was conducted in person. ? ?BP (!) 154/70 (BP Location: Right Arm, Cuff Size: Normal)   Pulse 93   Temp (!) 97.5 ?F (36.4 ?C) (Temporal)   Ht 5' (1.524 m)   Wt 86 lb 8 oz (39.2 kg)   SpO2 98%   BMI 16.89 kg/m?   ?BP Readings from Last 3 Encounters:  ?09/26/21 (!) 154/70  ?08/22/21 122/68  ?02/12/21 138/70  ?  ?CC: tongue swelling ?Subjective:  ? ?HPI: ?Margaret Nguyen is a 81 y.o. female presenting on 09/26/2021 for Oral Swelling (Here for f/u of tongue swelling and low blood counts. ) ? ? ?See prior note for details.  ?Seen here last month for routine follow up visit. Found to have low white and red cells with WBC 1.9, Hgb 9.8, plt 156. Periph smear returned overall reassuring, normocytic/normochronic anemia with unremarkable rbc morphology and predominantly mature segmented neutrophils. SPEP returned normal, ferritin was elevated to 550 with mildly low transferrin saturation. Here for repeat labs. ?3 lb weight gain since then.  ?For low blood counts, referred to hematology - hasn't scheduled appt yet. # provided to call and schedule.  ? ?She was also stared on mirtazapine '15mg'$  nightly and has been tolerating this well.  ? ?In interim she developed tongue and lip swelling on 09/11/2021, called EMS and treated with 2 benadryl with benefit. This happened after she had eaten some salmon and cranberry juice - no h/o prior reactions to these. She was also on enalapril '20mg'$  - this has since been stopped.  ?Never throat swelling or dyspnea or chest pain, abd pain or skin rash.  ?We instead started losartan '50mg'$  daily. She has been taking this regularly. She doesn't have BP cuff at home.  ? ?She has previously had tongue swelling - years ago - to unknown prescription allergy medicine.  ?   ? ?Relevant past medical, surgical, family and social history reviewed and updated as indicated. Interim medical history  since our last visit reviewed. ?Allergies and medications reviewed and updated. ?Outpatient Medications Prior to Visit  ?Medication Sig Dispense Refill  ? acetaminophen (TYLENOL) 500 MG tablet Take 1 tablet (500 mg total) by mouth every 6 (six) hours as needed. 30 tablet 0  ? Calcium Carb-Cholecalciferol 500-400 MG-UNIT TABS Take by mouth.    ? colchicine 0.6 MG tablet TAKE 1 TABLET (0.6 MG TOTAL) BY MOUTH DAILY AS NEEDED (GOUT FLARE). 90 tablet 1  ? hydrochlorothiazide (HYDRODIURIL) 25 MG tablet Take 0.5 tablets (12.5 mg total) by mouth daily. 90 tablet 3  ? losartan (COZAAR) 50 MG tablet Take 1 tablet (50 mg total) by mouth daily. 90 tablet 1  ? mirtazapine (REMERON) 15 MG tablet TAKE 1 TABLET BY MOUTH EVERYDAY AT BEDTIME 90 tablet 1  ? Multiple Vitamins-Minerals (CENTRUM PO) Take by mouth.    ? potassium chloride (KLOR-CON) 10 MEQ tablet Take 1 tablet (10 mEq total) by mouth daily. 90 tablet 3  ? vitamin B-12 (CYANOCOBALAMIN) 500 MCG tablet Take 1 tablet (500 mcg total) by mouth once a week.    ? vitamin C (ASCORBIC ACID) 500 MG tablet Take 500 mg by mouth daily.    ? ?No facility-administered medications prior to visit.  ?  ? ?Per HPI unless specifically indicated in ROS section below ?Review of Systems ? ?Objective:  ?BP (!) 154/70 (BP Location: Right Arm, Cuff Size: Normal)  Pulse 93   Temp (!) 97.5 ?F (36.4 ?C) (Temporal)   Ht 5' (1.524 m)   Wt 86 lb 8 oz (39.2 kg)   SpO2 98%   BMI 16.89 kg/m?   ?Wt Readings from Last 3 Encounters:  ?09/26/21 86 lb 8 oz (39.2 kg)  ?08/22/21 83 lb 4 oz (37.8 kg)  ?02/12/21 86 lb 7 oz (39.2 kg)  ?  ?  ?Physical Exam ?Vitals and nursing note reviewed.  ?Constitutional:   ?   Appearance: Normal appearance. She is not ill-appearing.  ?HENT:  ?   Mouth/Throat:  ?   Mouth: Mucous membranes are moist.  ?   Pharynx: Oropharynx is clear. No oropharyngeal exudate or posterior oropharyngeal erythema.  ?   Comments: No tongue or lip swelling ?Cardiovascular:  ?   Rate and Rhythm:  Normal rate and regular rhythm. FrequentExtrasystoles are present. ?   Pulses: Normal pulses.  ?   Heart sounds: Normal heart sounds. No murmur heard. ?Pulmonary:  ?   Effort: Pulmonary effort is normal. No respiratory distress.  ?   Breath sounds: Normal breath sounds. No wheezing, rhonchi or rales.  ?Musculoskeletal:  ?   Right lower leg: No edema.  ?   Left lower leg: No edema.  ?Skin: ?   General: Skin is warm and dry.  ?   Findings: No rash.  ?Neurological:  ?   Mental Status: She is alert.  ?Psychiatric:     ?   Mood and Affect: Mood normal.     ?   Behavior: Behavior normal.  ? ?   ?Results for orders placed or performed in visit on 08/22/21  ?Pathologist smear review  ?Result Value Ref Range  ? Path Review    ? ?Lab Results  ?Component Value Date  ? [id number] 01/30/2021  ?  ?Assessment & Plan:  ?This visit occurred during the SARS-CoV-2 public health emergency.  Safety protocols were in place, including screening questions prior to the visit, additional usage of staff PPE, and extensive cleaning of exam room while observing appropriate contact time as indicated for disinfecting solutions.  ? ?Problem List Items Addressed This Visit   ? ? Essential hypertension  ?  Chronic, deteriorated off enalapril and on lower hctz dose as per below. No changes today - will ask her to buy BP cuff and monitor BP at home, update [place] with readings.  ?  ?  ? Normocytic anemia  ?  Persistent - pending hematology evaluation ?  ?  ? Vitamin B12 deficiency  ? Relevant Orders  ? Vitamin B12  ? Unintentional weight loss  ?  Weight stabilizing on remeron nightly - continue.  ?  ?  ? Leukopenia  ?  New, with reassuring peripheral smear - pending hematology evaluation. Update CBC today. ?  ?  ? Hyponatremia  ?  Update Na on lower HCTZ dose. ?  ?  ? Angioedema - Primary  ?  Likely due to ACEI - now off this, started losartan '50mg'$  in its place. Discussed ACEI induced angioedema. She also had recently had salmon and cranberry juice  earlier that day. Pending allergy evaluation.  ?  ?  ?  ? ?No orders of the defined types were placed in this encounter. ? ?Orders Placed This Encounter  ?Procedures  ? Vitamin B12  ? ? ? ?Patient Instructions  ?Labs today.  ?Call cancer center at [place] [phone] to schedule an appointment with blood doctor.  ?Keep allergist appointment 11/2021.  ?  Schedule physical after 02/12/2022.  ?Buy blood pressure cuff at pharmacy - prescription provided today.  ?Good to see you today. Call us with questions.  ? ?Follow up plan: ?Return if symptoms worsen or fail to improve. ? ?Ria Bush, MD   ?

## 2021-09-26 NOTE — Assessment & Plan Note (Addendum)
Chronic, deteriorated off enalapril and on lower hctz dose as per below. No changes today - will ask her to buy BP cuff and monitor BP at home, update [place] with readings.  ?

## 2021-10-24 ENCOUNTER — Inpatient Hospital Stay: Payer: Medicare HMO | Attending: Oncology | Admitting: Oncology

## 2021-10-24 ENCOUNTER — Inpatient Hospital Stay: Payer: Medicare HMO

## 2021-10-24 ENCOUNTER — Encounter: Payer: Self-pay | Admitting: Oncology

## 2021-10-24 VITALS — BP 149/68 | HR 87 | Temp 96.7°F | Resp 14 | Ht 61.5 in | Wt 86.0 lb

## 2021-10-24 DIAGNOSIS — Z8261 Family history of arthritis: Secondary | ICD-10-CM | POA: Insufficient documentation

## 2021-10-24 DIAGNOSIS — R69 Illness, unspecified: Secondary | ICD-10-CM | POA: Diagnosis not present

## 2021-10-24 DIAGNOSIS — D649 Anemia, unspecified: Secondary | ICD-10-CM | POA: Insufficient documentation

## 2021-10-24 DIAGNOSIS — R7989 Other specified abnormal findings of blood chemistry: Secondary | ICD-10-CM | POA: Insufficient documentation

## 2021-10-24 DIAGNOSIS — Z86010 Personal history of colonic polyps: Secondary | ICD-10-CM | POA: Insufficient documentation

## 2021-10-24 DIAGNOSIS — Z8371 Family history of colonic polyps: Secondary | ICD-10-CM | POA: Insufficient documentation

## 2021-10-24 DIAGNOSIS — F1090 Alcohol use, unspecified, uncomplicated: Secondary | ICD-10-CM | POA: Insufficient documentation

## 2021-10-24 DIAGNOSIS — Z789 Other specified health status: Secondary | ICD-10-CM

## 2021-10-24 DIAGNOSIS — R634 Abnormal weight loss: Secondary | ICD-10-CM | POA: Diagnosis not present

## 2021-10-24 DIAGNOSIS — I10 Essential (primary) hypertension: Secondary | ICD-10-CM | POA: Insufficient documentation

## 2021-10-24 DIAGNOSIS — Z79899 Other long term (current) drug therapy: Secondary | ICD-10-CM | POA: Insufficient documentation

## 2021-10-24 DIAGNOSIS — Z8249 Family history of ischemic heart disease and other diseases of the circulatory system: Secondary | ICD-10-CM | POA: Diagnosis not present

## 2021-10-24 LAB — CBC WITH DIFFERENTIAL/PLATELET
Abs Immature Granulocytes: 0 10*3/uL (ref 0.00–0.07)
Basophils Absolute: 0 10*3/uL (ref 0.0–0.1)
Basophils Relative: 0 %
Eosinophils Absolute: 0 10*3/uL (ref 0.0–0.5)
Eosinophils Relative: 0 %
HCT: 32.9 % — ABNORMAL LOW (ref 36.0–46.0)
Hemoglobin: 10.7 g/dL — ABNORMAL LOW (ref 12.0–15.0)
Immature Granulocytes: 0 %
Lymphocytes Relative: 23 %
Lymphs Abs: 0.6 10*3/uL — ABNORMAL LOW (ref 0.7–4.0)
MCH: 29.6 pg (ref 26.0–34.0)
MCHC: 32.5 g/dL (ref 30.0–36.0)
MCV: 91.1 fL (ref 80.0–100.0)
Monocytes Absolute: 0.3 10*3/uL (ref 0.1–1.0)
Monocytes Relative: 12 %
Neutro Abs: 1.7 10*3/uL (ref 1.7–7.7)
Neutrophils Relative %: 65 %
Platelets: 228 10*3/uL (ref 150–400)
RBC: 3.61 MIL/uL — ABNORMAL LOW (ref 3.87–5.11)
RDW: 12.5 % (ref 11.5–15.5)
WBC: 2.7 10*3/uL — ABNORMAL LOW (ref 4.0–10.5)
nRBC: 0 % (ref 0.0–0.2)

## 2021-10-24 LAB — TECHNOLOGIST SMEAR REVIEW
Plt Morphology: NORMAL
RBC MORPHOLOGY: NORMAL
WBC MORPHOLOGY: NORMAL

## 2021-10-24 LAB — COMPREHENSIVE METABOLIC PANEL
ALT: 26 U/L (ref 0–44)
AST: 50 U/L — ABNORMAL HIGH (ref 15–41)
Albumin: 4.6 g/dL (ref 3.5–5.0)
Alkaline Phosphatase: 67 U/L (ref 38–126)
Anion gap: 9 (ref 5–15)
BUN: 33 mg/dL — ABNORMAL HIGH (ref 8–23)
CO2: 25 mmol/L (ref 22–32)
Calcium: 9.8 mg/dL (ref 8.9–10.3)
Chloride: 98 mmol/L (ref 98–111)
Creatinine, Ser: 1.19 mg/dL — ABNORMAL HIGH (ref 0.44–1.00)
GFR, Estimated: 46 mL/min — ABNORMAL LOW (ref >60)
Glucose, Bld: 123 mg/dL — ABNORMAL HIGH (ref 70–99)
Potassium: 4.5 mmol/L (ref 3.5–5.1)
Sodium: 132 mmol/L — ABNORMAL LOW (ref 135–145)
Total Bilirubin: 1 mg/dL (ref 0.3–1.2)
Total Protein: 8.5 g/dL — ABNORMAL HIGH (ref 6.5–8.1)

## 2021-10-24 LAB — IRON AND TIBC
Iron: 162 ug/dL (ref 28–170)
Saturation Ratios: 51 % — ABNORMAL HIGH (ref 10.4–31.8)
TIBC: 319 ug/dL (ref 250–450)
UIBC: 157 ug/dL

## 2021-10-24 LAB — RETIC PANEL
Immature Retic Fract: 8.5 % (ref 2.3–15.9)
RBC.: 3.59 MIL/uL — ABNORMAL LOW (ref 3.87–5.11)
Retic Count, Absolute: 31.2 10*3/uL (ref 19.0–186.0)
Retic Ct Pct: 0.9 % (ref 0.4–3.1)
Reticulocyte Hemoglobin: 35.5 pg (ref >27.9)

## 2021-10-24 LAB — HEPATITIS PANEL, ACUTE
HCV Ab: NONREACTIVE
Hep A IgM: NONREACTIVE
Hep B C IgM: NONREACTIVE
Hepatitis B Surface Ag: NONREACTIVE

## 2021-10-24 LAB — HIV ANTIBODY (ROUTINE TESTING W REFLEX): HIV Screen 4th Generation wRfx: NONREACTIVE

## 2021-10-24 LAB — FERRITIN: Ferritin: 582 ng/mL — ABNORMAL HIGH (ref 11–307)

## 2021-10-24 LAB — LACTATE DEHYDROGENASE: LDH: 153 U/L (ref 98–192)

## 2021-10-24 LAB — FOLATE: Folate: 34 ng/mL (ref >5.9)

## 2021-10-24 NOTE — Progress Notes (Signed)
?Hematology/Oncology Consult note ?[phone] [phone] ?  ? ?   ? ? ?Patient Care Team: ?Ria Bush, MD as PCP - General (Family Medicine) ? ?REFERRING PROVIDER: ?Ria Bush, MD  ?CHIEF COMPLAINTS/REASON FOR VISIT:  ?Evaluation of anemia ? ?HISTORY OF PRESENTING ILLNESS:  ? ?Margaret Nguyen is a  81 y.o.  female with PMH listed below was seen in consultation at the request of  Ria Bush, MD  for evaluation of anemia ? ?08/22/2021 cbc showed wbc 1.9, hemoglobin 9.8, normal platelet. SPEP showed no m protein.  Iron panel showed saturation of 35, ferritin of 554.  Reviewed previous records, patient has a chronic history of elevated ferritin. ?09/26/2021 Cbc showed wbc 3.2, hemoglobin 10.2, normal platelet.  ?B12 400,  ? ?Patient drinks alcohol routinely.  She drinks 2 shots of vodka daily.  She reports that ever since she was notified that she is referred to see hematology oncology, she has been drinking slightly more. ?She feels well otherwise.  She has lost weight since her dental work which caused her to have mild sore.  She also has had chronic night sweating ever since 1970s.  No fever. ? ? ? ?Review of Systems  ?Constitutional:  Positive for unexpected weight change. Negative for appetite change, chills, fatigue and fever.  ?HENT:   Negative for hearing loss and voice change.   ?     Dental work which caused mouth sore  ?Eyes:  Negative for eye problems.  ?Respiratory:  Negative for chest tightness and cough.   ?Cardiovascular:  Negative for chest pain.  ?Gastrointestinal:  Negative for abdominal distention, abdominal pain and blood in stool.  ?Endocrine: Negative for hot flashes.  ?Genitourinary:  Negative for difficulty urinating and frequency.   ?Musculoskeletal:  Negative for arthralgias.  ?Skin:  Negative for itching and rash.  ?Neurological:  Negative for extremity weakness.  ?Hematological:  Negative for adenopathy.  ?Psychiatric/Behavioral:  Negative for  confusion.   ? ?MEDICAL HISTORY:  ?Past Medical History:  ?Diagnosis Date  ? Allergy   ? Anemia   ? Arthritis   ? Blood transfusion without reported diagnosis   ? Diverticulitis   ? Family hx colonic polyps   ? Heart murmur   ? has Rheumatic fever as a child   ? Hiatal hernia   ? Hypertension   ? Osteoporosis 08/2009  ? R femur -2.5  ? Villous adenoma of colon   ? ? ?SURGICAL HISTORY: ?Past Surgical History:  ?Procedure Laterality Date  ? ABDOMINAL HYSTERECTOMY    ? COLONOSCOPY  04/2010  ? polyps, diverticulosis, hem, rec rpt 3 yrs [facility] Plan)  ? COLONOSCOPY  04/2014  ? mult TA, one with high grade dysplasia, mod diverticulosis, rpt 3 yrs [facility] Plan)  ? COLONOSCOPY  03/2019  ? 1 TA, diverticulosis, no f/u planned [facility] Plan)  ? DEXA  08/2009  ? femur -2.5  ? DEXA  04/2015  ? T -3 hips, -1.8 spine  ? LAPAROSCOPIC SIGMOID COLECTOMY  1999?  ? villous adenoma  ? PARTIAL HYSTERECTOMY    ? ovaries remain  ? TOE SURGERY    ? TUBAL LIGATION    ? ? ?SOCIAL HISTORY: ?Social History  ? ?Socioeconomic History  ? Marital status: Widowed  ?  Spouse name: Not on file  ? Number of children: Not on file  ? Years of education: Not on file  ? Highest education level: Not on file  ?Occupational History  ? Not on file  ?Tobacco Use  ? Smoking status:  Never  ? Smokeless tobacco: Never  ?Vaping Use  ? Vaping Use: Never used  ?Substance and Sexual Activity  ? Alcohol use: Yes  ?  Alcohol/week: 2.0 standard drinks  ?  Types: 2 Shots of liquor per week  ?  Comment: 2 drinks of votka daily.  ? Drug use: Never  ? Sexual activity: Not Currently  ?Other Topics Concern  ? Not on file  ?Social History Narrative  ? Married [phone], widowed  ? 1 son, 2 daughters, 1 son deceased  ? 5 grandchildren  ? Lives alone; I- ADLs  ? Working-fulltime [employer]  ?   ?   ? ?Social Determinants of Health  ? ?Financial Resource Strain: Low Risk   ? Difficulty of Paying Living Expenses: Not hard at all  ?Food Insecurity: No Food Insecurity  ? Worried About Sales executive in the Last Year: Never true  ? Ran Out of Food in the Last Year: Never true  ?Transportation Needs: No Transportation Needs  ? Lack of Transportation (Medical): No  ? Lack of Transportation (Non-Medical): No  ?Physical Activity: Inactive  ? Days of Exercise per Week: 0 days  ? Minutes of Exercise per Session: 0 min  ?Stress: No Stress Concern Present  ? Feeling of Stress : Not at all  ?Social Connections: Not on file  ?Intimate Partner Violence: Not At Risk  ? Fear of Current or Ex-Partner: No  ? Emotionally Abused: No  ? Physically Abused: No  ? Sexually Abused: No  ? ? ?FAMILY HISTORY: ?Family History  ?Problem Relation Age of Onset  ? Arthritis Mother   ? Arthritis Father   ? Heart disease Father   ? Hypertension Father   ? Colon cancer Neg Hx   ? Esophageal cancer Neg Hx   ? Rectal cancer Neg Hx   ? Stomach cancer Neg Hx   ? Colon polyps Neg Hx   ? ? ?ALLERGIES:  is allergic to other. ? ?MEDICATIONS:  ?Current Outpatient Medications  ?Medication Sig Dispense Refill  ? acetaminophen (TYLENOL) 500 MG tablet Take 1 tablet (500 mg total) by mouth every 6 (six) hours as needed. 30 tablet 0  ? Calcium Carb-Cholecalciferol 500-400 MG-UNIT TABS Take by mouth.    ? Cholecalciferol (VITAMIN D3) 25 MCG (1000 UT) CAPS Take by mouth.    ? colchicine 0.6 MG tablet TAKE 1 TABLET (0.6 MG TOTAL) BY MOUTH DAILY AS NEEDED (GOUT FLARE). 90 tablet 1  ? hydrochlorothiazide (HYDRODIURIL) 25 MG tablet Take 0.5 tablets (12.5 mg total) by mouth daily. 90 tablet 3  ? losartan (COZAAR) 50 MG tablet Take 1 tablet (50 mg total) by mouth daily. 90 tablet 1  ? Multiple Vitamins-Minerals (CENTRUM PO) Take by mouth.    ? potassium chloride (KLOR-CON) 10 MEQ tablet Take 1 tablet (10 mEq total) by mouth daily. 90 tablet 3  ? vitamin B-12 (CYANOCOBALAMIN) 500 MCG tablet Take 1 tablet (500 mcg total) by mouth once a week.    ? vitamin C (ASCORBIC ACID) 500 MG tablet Take 500 mg by mouth daily.    ? mirtazapine (REMERON) 15 MG tablet TAKE 1  TABLET BY MOUTH EVERYDAY AT BEDTIME (Patient not taking: Reported on 10/24/2021) 90 tablet 1  ? ?No current facility-administered medications for this visit.  ? ? ? ?PHYSICAL EXAMINATION: ?ECOG PERFORMANCE STATUS: 1 - Symptomatic but completely ambulatory ?Vitals:  ? 10/24/21 0943  ?BP: (!) 149/68  ?Pulse: 87  ?Resp: 14  ?Temp: (!) 96.7 ?F (35.9 ?  C)  ?SpO2: 99%  ? ?Filed Weights  ? 10/24/21 0943  ?Weight: 86 lb (39 kg)  ? ? ?Physical Exam ?Constitutional:   ?   General: She is not in acute distress. ?   Comments: Frail thin built elderly female, ambulates independently  ?HENT:  ?   Head: Normocephalic and atraumatic.  ?Eyes:  ?   General: No scleral icterus. ?Cardiovascular:  ?   Rate and Rhythm: Normal rate and regular rhythm.  ?   Heart sounds: Normal heart sounds.  ?Pulmonary:  ?   Effort: Pulmonary effort is normal. No respiratory distress.  ?   Breath sounds: No wheezing.  ?Abdominal:  ?   General: Bowel sounds are normal. There is no distension.  ?   Palpations: Abdomen is soft.  ?Musculoskeletal:     ?   General: No deformity. Normal range of motion.  ?   Cervical back: Normal range of motion and neck supple.  ?Skin: ?   General: Skin is warm and dry.  ?   Findings: No erythema or rash.  ?Neurological:  ?   Mental Status: She is alert and oriented to person, place, and time. Mental status is at baseline.  ?   Cranial Nerves: No cranial nerve deficit.  ?Psychiatric:     ?   Mood and Affect: Mood normal.  ? ? ?LABORATORY DATA:  ?I have reviewed the data as listed ?Lab Results  ?Component Value Date  ? WBC 2.7 (L) 10/24/2021  ? HGB 10.7 (L) 10/24/2021  ? HCT 32.9 (L) 10/24/2021  ? MCV 91.1 10/24/2021  ? PLT 228 10/24/2021  ? ?Recent Labs  ?  01/30/21 ?0855 08/22/21 ?0940 08/22/21 ?1259 09/26/21 ?2585 10/24/21 ?1014  ?NA 136 128*  --  134* 132*  ?K 4.3 3.9  --  4.1 4.5  ?CL 99 94*  --  96 98  ?CO2 24 28  --  29 25  ?GLUCOSE 140* 119*  --  100* 123*  ?BUN 42* 21  --  39* 33*  ?CREATININE 1.15 1.10  --  1.21* 1.19*   ?CALCIUM 9.8 9.0  --  9.4 9.8  ?GFRNONAA  --   --   --   --  49*  ?PROT 7.4  --  7.1  --  8.5*  ?ALBUMIN 4.3 3.9  --   --  4.6  ?AST 31  --   --   --  50*  ?ALT 19  --   --   --  26  ?ALKPHOS 56  --

## 2021-10-24 NOTE — Progress Notes (Signed)
Pts states she is nervous and scared for the visit.  ?

## 2021-10-27 LAB — MULTIPLE MYELOMA PANEL, SERUM
Albumin SerPl Elph-Mcnc: 4 g/dL (ref 2.9–4.4)
Albumin/Glob SerPl: 1.1 (ref 0.7–1.7)
Alpha 1: 0.2 g/dL (ref 0.0–0.4)
Alpha2 Glob SerPl Elph-Mcnc: 0.7 g/dL (ref 0.4–1.0)
B-Globulin SerPl Elph-Mcnc: 1.1 g/dL (ref 0.7–1.3)
Gamma Glob SerPl Elph-Mcnc: 1.8 g/dL (ref 0.4–1.8)
Globulin, Total: 3.8 g/dL (ref 2.2–3.9)
IgA: 395 mg/dL (ref 64–422)
IgG (Immunoglobin G), Serum: 1602 mg/dL (ref 586–1602)
IgM (Immunoglobulin M), Srm: 203 mg/dL (ref 26–217)
Total Protein ELP: 7.8 g/dL (ref 6.0–8.5)

## 2021-10-27 LAB — KAPPA/LAMBDA LIGHT CHAINS
Kappa free light chain: 52.5 mg/L — ABNORMAL HIGH (ref 3.3–19.4)
Kappa, lambda light chain ratio: 1.23 (ref 0.26–1.65)
Lambda free light chains: 42.6 mg/L — ABNORMAL HIGH (ref 5.7–26.3)

## 2021-10-27 LAB — METHYLMALONIC ACID, SERUM: Methylmalonic Acid, Quantitative: 231 nmol/L (ref 0–378)

## 2021-11-07 LAB — HEMOCHROMATOSIS DNA-PCR(C282Y,H63D)

## 2021-11-11 ENCOUNTER — Encounter: Payer: Self-pay | Admitting: Allergy & Immunology

## 2021-11-11 ENCOUNTER — Ambulatory Visit: Payer: Medicare HMO | Admitting: Allergy & Immunology

## 2021-11-11 VITALS — BP 152/50 | HR 83 | Temp 97.7°F | Resp 12 | Ht 60.5 in | Wt 87.6 lb

## 2021-11-11 DIAGNOSIS — R220 Localized swelling, mass and lump, head: Secondary | ICD-10-CM | POA: Diagnosis not present

## 2021-11-11 NOTE — Progress Notes (Signed)
? ?NEW PATIENT ? ?Date of Service/Encounter:  11/11/21 ? ?Consult requested by: Ria Bush, MD ? ? ?Assessment:  ? ?Tongue swelling ? ?Plan/Recommendations:  ? ?1. Tongue swelling ?- We are going to look at a seafood panel although I do not think that we are working with a food allergy. ?- We are also going to get some labs to look for serious causes of swelling, although I think these are unlikely as well. ?- If this was indeed related to your blood pressure medication, you can continue to have episodes even AFTER stopping the ACE inhibitor, although they become less frequent over time and eventually stop completely.  ?- We will call you in 1-2 weeks with the results of the testing.  ? ?2. Return in about 3 months (around 02/11/2022).  ? ? ? ?This note in its entirety was forwarded to the Provider who requested this consultation. ? ?Subjective:  ? ?Margaret Nguyen is a 81 y.o. female presenting today for evaluation of  ?Chief Complaint  ?Patient presents with  ? Allergic Reaction  ?  Ate some salmon and her tongue swollen real bad. Got benadryl and it went down.   ? Allergy Testing  ?  Food?,   ? ? ?Margaret Nguyen has a history of the following: ?Patient Active Problem List  ? Diagnosis Date Noted  ? Angioedema 09/26/2021  ? Leukopenia 08/23/2021  ? Elevated ferritin 08/23/2021  ? Hyponatremia 08/23/2021  ? Decreased exercise tolerance 08/23/2021  ? Urinary urgency 08/22/2021  ? History of rheumatic fever 08/22/2021  ? Health maintenance examination 02/12/2021  ? Underweight 09/15/2020  ? Fatty liver 08/12/2020  ? Subclinical hypothyroidism 08/07/2020  ? Thyroid nodule 08/07/2020  ? Left sided sciatica 08/14/2019  ? CKD (chronic kidney disease) stage 3, GFR 30-59 ml/min (HCC) 04/14/2018  ? Hiatal hernia 03/31/2018  ? Unintentional weight loss 01/12/2018  ? Bilateral foot pain 11/08/2017  ? Normocytic anemia 11/09/2016  ? Vitamin B12 deficiency 11/09/2016  ? Dyslipidemia 11/02/2016  ? Medicare  annual wellness visit, subsequent 09/09/2015  ? Advanced care planning/counseling discussion 09/09/2015  ? Diverticulosis of colon without hemorrhage 09/09/2015  ? Right sided sciatica 01/16/2014  ? Osteoporosis 08/13/2009  ? ALOPECIA 02/28/2008  ? Essential hypertension 05/09/2007  ? DEGENERATIVE JOINT DISEASE, LEFT HIP 05/09/2007  ? Adenomatous colon polyp 05/09/2007  ? ? ?History obtained from: chart review and patient and her delightful daughter. ? ?Margaret Nguyen was referred by Ria Bush, MD.    ? ?Margaret Nguyen is a 81 y.o. female presenting for an evaluation of lip swelling .  ? ?She started having issues with tongue swelling. She was eating some salmon from the can and she noticed her tongue was swelling at that time. She had taken Benadryl and called 911 and they come to see her. She was better by the time the parademic arrived. She called her daughter who is an Therapist, sports. She could tell over the phone that the tongue was thicken. She did drink some cranberry juice around the same time and she wonders whether this was it. She does not have pictures.  ? ?She tolerates all other foods without adverse event. Her daughter describes her as an "eater", although clearly she has intense metabolism.  ? ?This happened previously. It has been a "while". She had her BP changed from enalapril to losartan quickly. She has not had swelling episode since that time. However,Margaret Nguyen and her daughter are skeptical since she had been on this medication  for years. But it should be noted that she has had no reactions since changing her BP medication.  ? ?She worked at a [employer]. Her husband was a Administrator and died in [id number].  ?  ?Otherwise, there is no history of other atopic diseases, including asthma, food allergies, drug allergies, environmental allergies, stinging insect allergies, or contact dermatitis. There is no significant infectious history. Vaccinations are up to date.  ? ? ?Past  Medical History: ?Patient Active Problem List  ? Diagnosis Date Noted  ? Angioedema 09/26/2021  ? Leukopenia 08/23/2021  ? Elevated ferritin 08/23/2021  ? Hyponatremia 08/23/2021  ? Decreased exercise tolerance 08/23/2021  ? Urinary urgency 08/22/2021  ? History of rheumatic fever 08/22/2021  ? Health maintenance examination 02/12/2021  ? Underweight 09/15/2020  ? Fatty liver 08/12/2020  ? Subclinical hypothyroidism 08/07/2020  ? Thyroid nodule 08/07/2020  ? Left sided sciatica 08/14/2019  ? CKD (chronic kidney disease) stage 3, GFR 30-59 ml/min (HCC) 04/14/2018  ? Hiatal hernia 03/31/2018  ? Unintentional weight loss 01/12/2018  ? Bilateral foot pain 11/08/2017  ? Normocytic anemia 11/09/2016  ? Vitamin B12 deficiency 11/09/2016  ? Dyslipidemia 11/02/2016  ? Medicare annual wellness visit, subsequent 09/09/2015  ? Advanced care planning/counseling discussion 09/09/2015  ? Diverticulosis of colon without hemorrhage 09/09/2015  ? Right sided sciatica 01/16/2014  ? Osteoporosis 08/13/2009  ? ALOPECIA 02/28/2008  ? Essential hypertension 05/09/2007  ? DEGENERATIVE JOINT DISEASE, LEFT HIP 05/09/2007  ? Adenomatous colon polyp 05/09/2007  ? ? ?Medication List:  ?Allergies as of 11/11/2021   ? ?   Reactions  ? Other Swelling  ? Prescription allergy med  ? ?  ? ?  ?Medication List  ?  ? ?  ? Accurate as of Nov 11, 2021 11:59 PM. If you have any questions, ask your nurse or doctor.  ?  ?  ? ?  ? ?acetaminophen 500 MG tablet ?Commonly known as: TYLENOL ?Take 1 tablet (500 mg total) by mouth every 6 (six) hours as needed. ?  ?Calcium Carb-Cholecalciferol 500-400 MG-UNIT Tabs ?Take by mouth. ?  ?CENTRUM PO ?Take by mouth. ?  ?colchicine 0.6 MG tablet ?TAKE 1 TABLET (0.6 MG TOTAL) BY MOUTH DAILY AS NEEDED (GOUT FLARE). ?  ?hydrochlorothiazide 25 MG tablet ?Commonly known as: HYDRODIURIL ?Take 0.5 tablets (12.5 mg total) by mouth daily. ?  ?losartan 50 MG tablet ?Commonly known as: COZAAR ?Take 1 tablet (50 mg total) by mouth  daily. ?  ?mirtazapine 15 MG tablet ?Commonly known as: REMERON ?TAKE 1 TABLET BY MOUTH EVERYDAY AT BEDTIME ?  ?potassium chloride 10 MEQ tablet ?Commonly known as: KLOR-CON ?Take 1 tablet (10 mEq total) by mouth daily. ?  ?vitamin B-12 500 MCG tablet ?Commonly known as: CYANOCOBALAMIN ?Take 1 tablet (500 mcg total) by mouth once a week. ?  ?vitamin C 500 MG tablet ?Commonly known as: ASCORBIC ACID ?Take 500 mg by mouth daily. ?  ?Vitamin D3 25 MCG (1000 UT) Caps ?Take by mouth. ?  ? ?  ? ? ?Birth History: non-contributory ? ?Developmental History: non-contributory ? ?Past Surgical History: ?Past Surgical History:  ?Procedure Laterality Date  ? ABDOMINAL HYSTERECTOMY    ? COLONOSCOPY  04/2010  ? polyps, diverticulosis, hem, rec rpt 3 yrs [facility] Plan)  ? COLONOSCOPY  04/2014  ? mult TA, one with high grade dysplasia, mod diverticulosis, rpt 3 yrs [facility] Plan)  ? COLONOSCOPY  03/2019  ? 1 TA, diverticulosis, no f/u planned [facility] Plan)  ? DEXA  08/2009  ? femur -2.5  ? DEXA  04/2015  ? T -3 hips, -1.8 spine  ? LAPAROSCOPIC SIGMOID COLECTOMY  1999?  ? villous adenoma  ? PARTIAL HYSTERECTOMY    ? ovaries remain  ? TOE SURGERY    ? TUBAL LIGATION    ? ? ? ?Family History: ?Family History  ?Problem Relation Age of Onset  ? Arthritis Mother   ? Arthritis Father   ? Heart disease Father   ? Hypertension Father   ? Allergic rhinitis Son   ? Allergic rhinitis Daughter   ? Colon cancer Neg Hx   ? Esophageal cancer Neg Hx   ? Rectal cancer Neg Hx   ? Stomach cancer Neg Hx   ? Colon polyps Neg Hx   ? ? ? ?Social History: Donyetta lives at home by herself.  She lives in a house that is 81 years old.  There is carpeting in the main living area bedroom.  They have gas heating and central cooling.  There are no animals inside or outside of the home.  There are no dust mite covers on the bedding.  There is no tobacco exposure.  She is currently retired.  There is no fume, chemical, or dust exposures. ? ? ?Review of Systems  ?Constitutional: Negative.   Negative for fever, malaise/fatigue and weight loss.  ?HENT: Negative.  Negative for congestion, ear discharge and ear pain.   ?Eyes:  Negative for pain, discharge and redness.  ?Respiratory:  Negative for coug

## 2021-11-11 NOTE — Patient Instructions (Addendum)
1. Tongue swelling ?- We are going to look at a seafood panel although I do not think that we are working with a food allergy. ?- We are also going to get some labs to look for serious causes of swelling, although I think these are unlikely as well. ?- If this was indeed related to your blood pressure medication, you can continue to have episodes even AFTER stopping the ACE inhibitor, although they become ;less frequent over time and eventually stop completely.  ? ?2. Return in about 3 months (around 02/11/2022).  ? ? ?Please inform us of any Emergency Department visits, hospitalizations, or changes in symptoms. Call us before going to the ED for breathing or allergy symptoms since we might be able to fit you in for a sick visit. Feel free to contact us anytime with any questions, problems, or concerns. ? ?It was a pleasure to meet you and your family today! You guys are a hoot!  ? ?Websites that have reliable patient information: ?1. American Academy of Asthma, Allergy, and Immunology: www.aaaai.org ?2. Food Allergy Research and Education (FARE): foodallergy.org ?3. Mothers of Asthmatics: http://www.asthmacommunitynetwork.org ?4. [employer] of Allergy, Asthma, and Immunology: MonthlyElectricBill.co.uk ? ? ?COVID-19 Vaccine Information can be found at: ShippingScam.co.uk For questions related to vaccine distribution or appointments, please email [email or web address] or call [phone].  ? ?We realize that you might be concerned about having an allergic reaction to the COVID19 vaccines. To help with that concern, WE ARE OFFERING THE COVID19 VACCINES IN OUR OFFICE! Ask the front desk for dates!  ? ? ? ??Like? [place] on Facebook and Instagram for our latest updates!  ?  ? ? ?A healthy democracy works best when [employer] participate! Make sure you are registered to vote! If you have moved or changed any of your contact information, you will need to get this updated  before voting! ? ?In some cases, you MAY be able to register to vote online: [email or web address] ? ? ? ? ? ? ? ? ? ? ?

## 2021-11-12 ENCOUNTER — Encounter: Payer: Self-pay | Admitting: Allergy & Immunology

## 2021-11-19 LAB — C1 ESTERASE INHIBITOR, FUNCTIONAL: C1INH Functional/C1INH Total MFr SerPl: >93 %mean normal

## 2021-11-19 LAB — ALLERGY PANEL 19, SEAFOOD GROUP
Allergen Salmon IgE: <0.1 kU/L
Catfish: <0.1 kU/L
Codfish IgE: <0.1 kU/L
F023-IgE Crab: 0.24 kU/L — AB
F080-IgE Lobster: 0.39 kU/L — AB
Shrimp IgE: 0.73 kU/L — AB
Tuna: <0.1 kU/L

## 2021-11-19 LAB — TRYPTASE: Tryptase: 7 ug/L (ref 2.2–13.2)

## 2021-11-19 LAB — C3 AND C4
Complement C3, Serum: 88 mg/dL (ref 82–167)
Complement C4, Serum: 27 mg/dL (ref 12–38)

## 2021-11-19 LAB — COMPLEMENT COMPONENT C1Q: Complement C1Q: 11.5 mg/dL (ref 10.3–20.5)

## 2021-11-19 LAB — C1 ESTERASE INHIBITOR: C1INH SerPl-mCnc: 37 mg/dL (ref 21–39)

## 2021-11-19 LAB — F341-IGE CRANBERRY: Cranberry IgE: <0.1 kU/L

## 2021-11-20 ENCOUNTER — Inpatient Hospital Stay: Payer: Medicare HMO | Attending: Oncology | Admitting: Oncology

## 2021-11-20 ENCOUNTER — Encounter: Payer: Self-pay | Admitting: Oncology

## 2021-11-20 VITALS — BP 151/74 | HR 85 | Temp 97.0°F | Wt 86.3 lb

## 2021-11-20 DIAGNOSIS — K760 Fatty (change of) liver, not elsewhere classified: Secondary | ICD-10-CM | POA: Diagnosis not present

## 2021-11-20 DIAGNOSIS — Z86010 Personal history of colonic polyps: Secondary | ICD-10-CM | POA: Diagnosis not present

## 2021-11-20 DIAGNOSIS — R7989 Other specified abnormal findings of blood chemistry: Secondary | ICD-10-CM | POA: Diagnosis not present

## 2021-11-20 DIAGNOSIS — R69 Illness, unspecified: Secondary | ICD-10-CM | POA: Diagnosis not present

## 2021-11-20 DIAGNOSIS — Z789 Other specified health status: Secondary | ICD-10-CM

## 2021-11-20 DIAGNOSIS — R768 Other specified abnormal immunological findings in serum: Secondary | ICD-10-CM | POA: Diagnosis not present

## 2021-11-20 DIAGNOSIS — Z79899 Other long term (current) drug therapy: Secondary | ICD-10-CM | POA: Diagnosis not present

## 2021-11-20 DIAGNOSIS — Z8371 Family history of colonic polyps: Secondary | ICD-10-CM | POA: Insufficient documentation

## 2021-11-20 DIAGNOSIS — D649 Anemia, unspecified: Secondary | ICD-10-CM

## 2021-11-20 DIAGNOSIS — Z8249 Family history of ischemic heart disease and other diseases of the circulatory system: Secondary | ICD-10-CM | POA: Diagnosis not present

## 2021-11-20 DIAGNOSIS — I10 Essential (primary) hypertension: Secondary | ICD-10-CM | POA: Insufficient documentation

## 2021-11-20 DIAGNOSIS — R634 Abnormal weight loss: Secondary | ICD-10-CM | POA: Diagnosis not present

## 2021-11-20 DIAGNOSIS — F1090 Alcohol use, unspecified, uncomplicated: Secondary | ICD-10-CM | POA: Diagnosis not present

## 2021-11-20 DIAGNOSIS — Z8261 Family history of arthritis: Secondary | ICD-10-CM | POA: Insufficient documentation

## 2021-11-20 NOTE — Progress Notes (Signed)
?Hematology/Oncology Progress note ?[phone] [phone]) [phone] ?  ? ?  ? ?   ? ? ?Patient Care Team: ?Ria Bush, MD as PCP - General (Family Medicine) ? ?REFERRING PROVIDER: ?Ria Bush, MD  ?CHIEF COMPLAINTS/REASON FOR VISIT:  ?Follow-up for anemia. ? ?HISTORY OF PRESENTING ILLNESS:  ? ?Margaret Nguyen is a  81 y.o.  female with PMH listed below was seen in consultation at the request of  Ria Bush, MD  for evaluation of anemia ? ?08/22/2021 cbc showed wbc 1.9, hemoglobin 9.8, normal platelet. SPEP showed no m protein.  Iron panel showed saturation of 35, ferritin of 554.  Reviewed previous records, patient has a chronic history of elevated ferritin. ?09/26/2021 Cbc showed wbc 3.2, hemoglobin 10.2, normal platelet.  ?B12 400,  ? ?Patient drinks alcohol routinely.  She drinks 2 shots of vodka daily.  She reports that ever since she was notified that she is referred to see hematology oncology, she has been drinking slightly more. ?She feels well otherwise.  She has lost weight since her dental work which caused her to have mild sore.  She also has had chronic night sweating ever since 1970s.  No fever. ? ?INTERVAL HISTORY ?Margaret Nguyen is a 81 y.o. female who has above history reviewed by me today presents for follow up visit for management of anemia ?Patient had blood work done after last visit.  Present to discuss results.  She has tried to cut down alcohol consumption ?Patient has no new complaints.  Weight is relatively stable comparing to last visit.  Patient was recently diagnosed having allergy to shell fish.  ? ?Review of Systems  ?Constitutional:  Positive for unexpected weight change. Negative for appetite change, chills, fatigue and fever.  ?HENT:   Negative for hearing loss and voice change.   ?     Dental work which caused mouth sore  ?Eyes:  Negative for eye problems.  ?Respiratory:  Negative for chest tightness and cough.   ?Cardiovascular:   Negative for chest pain.  ?Gastrointestinal:  Negative for abdominal distention, abdominal pain and blood in stool.  ?Endocrine: Negative for hot flashes.  ?Genitourinary:  Negative for difficulty urinating and frequency.   ?Musculoskeletal:  Negative for arthralgias.  ?Skin:  Negative for itching and rash.  ?Neurological:  Negative for extremity weakness.  ?Hematological:  Negative for adenopathy.  ?Psychiatric/Behavioral:  Negative for confusion.   ? ?MEDICAL HISTORY:  ?Past Medical History:  ?Diagnosis Date  ? Allergy   ? Anemia   ? Arthritis   ? Blood transfusion without reported diagnosis   ? Diverticulitis   ? Family hx colonic polyps   ? Heart murmur   ? has Rheumatic fever as a child   ? Hiatal hernia   ? Hypertension   ? Osteoporosis 08/2009  ? R femur -2.5  ? Villous adenoma of colon   ? ? ?SURGICAL HISTORY: ?Past Surgical History:  ?Procedure Laterality Date  ? ABDOMINAL HYSTERECTOMY    ? COLONOSCOPY  04/2010  ? polyps, diverticulosis, hem, rec rpt 3 yrs Fuller Plan)  ? COLONOSCOPY  04/2014  ? mult TA, one with high grade dysplasia, mod diverticulosis, rpt 3 yrs Fuller Plan)  ? COLONOSCOPY  03/2019  ? 1 TA, diverticulosis, no f/u planned Fuller Plan)  ? DEXA  08/2009  ? femur -2.5  ? DEXA  04/2015  ? T -3 hips, -1.8 spine  ? LAPAROSCOPIC SIGMOID COLECTOMY  1999?  ? villous adenoma  ? PARTIAL HYSTERECTOMY    ?  ovaries remain  ? TOE SURGERY    ? TUBAL LIGATION    ? ? ?SOCIAL HISTORY: ?Social History  ? ?Socioeconomic History  ? Marital status: Widowed  ?  Spouse name: Not on file  ? Number of children: Not on file  ? Years of education: Not on file  ? Highest education level: Not on file  ?Occupational History  ? Not on file  ?Tobacco Use  ? Smoking status: Never  ?  Passive exposure: Past  ? Smokeless tobacco: Never  ?Vaping Use  ? Vaping Use: Never used  ?Substance and Sexual Activity  ? Alcohol use: Yes  ?  Alcohol/week: 2.0 standard drinks  ?  Types: 2 Shots of liquor per week  ?  Comment: 2 drinks of vodka daily.  ?  Drug use: Never  ? Sexual activity: Not Currently  ?Other Topics Concern  ? Not on file  ?Social History Narrative  ? Married [phone], widowed  ? 1 son, 2 daughters, 1 son deceased  ? 5 grandchildren  ? Lives alone; I- ADLs  ? Working-fulltime [employer]  ?   ?   ? ?Social Determinants of Health  ? ?Financial Resource Strain: Low Risk   ? Difficulty of Paying Living Expenses: Not hard at all  ?Food Insecurity: No Food Insecurity  ? Worried About Charity fundraiser in the Last Year: Never true  ? Ran Out of Food in the Last Year: Never true  ?Transportation Needs: No Transportation Needs  ? Lack of Transportation (Medical): No  ? Lack of Transportation (Non-Medical): No  ?Physical Activity: Inactive  ? Days of Exercise per Week: 0 days  ? Minutes of Exercise per Session: 0 min  ?Stress: No Stress Concern Present  ? Feeling of Stress : Not at all  ?Social Connections: Not on file  ?Intimate Partner Violence: Not At Risk  ? Fear of Current or Ex-Partner: No  ? Emotionally Abused: No  ? Physically Abused: No  ? Sexually Abused: No  ? ? ?FAMILY HISTORY: ?Family History  ?Problem Relation Age of Onset  ? Arthritis Mother   ? Arthritis Father   ? Heart disease Father   ? Hypertension Father   ? Allergic rhinitis Son   ? Allergic rhinitis Daughter   ? Colon cancer Neg Hx   ? Esophageal cancer Neg Hx   ? Rectal cancer Neg Hx   ? Stomach cancer Neg Hx   ? Colon polyps Neg Hx   ? ? ?ALLERGIES:  is allergic to other. ? ?MEDICATIONS:  ?Current Outpatient Medications  ?Medication Sig Dispense Refill  ? acetaminophen (TYLENOL) 500 MG tablet Take 1 tablet (500 mg total) by mouth every 6 (six) hours as needed. 30 tablet 0  ? Calcium Carb-Cholecalciferol 500-400 MG-UNIT TABS Take by mouth.    ? Cholecalciferol (VITAMIN D3) 25 MCG (1000 UT) CAPS Take by mouth.    ? colchicine 0.6 MG tablet TAKE 1 TABLET (0.6 MG TOTAL) BY MOUTH DAILY AS NEEDED (GOUT FLARE). 90 tablet 1  ? hydrochlorothiazide (HYDRODIURIL) 25 MG tablet Take 0.5  tablets (12.5 mg total) by mouth daily. 90 tablet 3  ? losartan (COZAAR) 50 MG tablet Take 1 tablet (50 mg total) by mouth daily. 90 tablet 1  ? mirtazapine (REMERON) 15 MG tablet TAKE 1 TABLET BY MOUTH EVERYDAY AT BEDTIME 90 tablet 1  ? Multiple Vitamins-Minerals (CENTRUM PO) Take by mouth.    ? potassium chloride (KLOR-CON) 10 MEQ tablet Take 1 tablet (10 mEq total)  by mouth daily. 90 tablet 3  ? vitamin B-12 (CYANOCOBALAMIN) 500 MCG tablet Take 1 tablet (500 mcg total) by mouth once a week.    ? vitamin C (ASCORBIC ACID) 500 MG tablet Take 500 mg by mouth daily.    ? ?No current facility-administered medications for this visit.  ? ? ? ?PHYSICAL EXAMINATION: ?ECOG PERFORMANCE STATUS: 1 - Symptomatic but completely ambulatory ?Vitals:  ? 11/20/21 1015  ?BP: (!) 151/74  ?Pulse: 85  ?Temp: (!) 97 ?F (36.1 ?C)  ? ?Filed Weights  ? 11/20/21 1015  ?Weight: 86 lb 4.8 oz (39.1 kg)  ? ? ?Physical Exam ?Constitutional:   ?   General: She is not in acute distress. ?   Comments: Frail thin built elderly female, ambulates independently  ?HENT:  ?   Head: Normocephalic and atraumatic.  ?Eyes:  ?   General: No scleral icterus. ?Cardiovascular:  ?   Rate and Rhythm: Normal rate and regular rhythm.  ?   Heart sounds: Normal heart sounds.  ?Pulmonary:  ?   Effort: Pulmonary effort is normal. No respiratory distress.  ?   Breath sounds: No wheezing.  ?Abdominal:  ?   General: Bowel sounds are normal. There is no distension.  ?   Palpations: Abdomen is soft.  ?Musculoskeletal:     ?   General: No deformity. Normal range of motion.  ?   Cervical back: Normal range of motion and neck supple.  ?Skin: ?   General: Skin is warm and dry.  ?   Findings: No erythema or rash.  ?Neurological:  ?   Mental Status: She is alert and oriented to person, place, and time. Mental status is at baseline.  ?   Cranial Nerves: No cranial nerve deficit.  ?Psychiatric:     ?   Mood and Affect: Mood normal.  ? ? ?LABORATORY DATA:  ?I have reviewed the data  as listed ?Lab Results  ?Component Value Date  ? WBC 2.7 (L) 10/24/2021  ? HGB 10.7 (L) 10/24/2021  ? HCT 32.9 (L) 10/24/2021  ? MCV 91.1 10/24/2021  ? PLT 228 10/24/2021  ? ?Recent Labs  ?  01/30/21 ?0855 02

## 2021-12-09 NOTE — Progress Notes (Unsigned)
12/10/2021 Mai Longnecker [id number] 02/04/1941  Referring provider: Ria Bush, MD Primary GI doctor: Dr. Fuller Plan  ASSESSMENT AND PLAN:   Elevated liver function tests Suspected non-alcoholic fatty liver disease by history and ultrasound. Must exclude other chronic causes of hepatocellular inflammation that can mimic fatty liver on ultrasound. 02/21/2021 abdominal ultrasound for weight loss and hyperglycemia showed distended gallbladder without stones or thickening of Murphy mild increased renal echogenicity suggestive of chronic medical renal disease, heterogeneous liver parenchyma without focal lesion no biliary ductal dilatation - will repeat RUQ [place] to evaluate, normal platelets.  - history of negative TIBC, hepatitis panel negative 10/2011 - Labs to include: gG, ANA, Antismooth muscle antibody, celiac, thyroid, INR - Abstain from all alcohol including beer, wine, liquor, and non-alcoholic beer.  - Work to maintain a health weight through portion control and exercise Maximize control of any hypergycemia and hyperlipidemia  Fatty liver --Continue to work on risk factor modification including diet exercise and control of risk factors including blood sugars. - monitor q 6 months.   History of adenomatous polyp of colon 04/08/2019 colonoscopy due to history of adenomatous polyps 6 mm adenomatous polyp diverticula entire colon healthy mucosa at prior end-to-end colocolonic anastomosis in sigmoid colon internal hemorrhoids.  No recall secondary to age  Normocytic anemia - normal iron, likely related to CKD stage 3 and B12 def.  Follows oncology  Vitamin B12 deficiency Continue on B12   Stage 3a chronic kidney disease (HCC) Monitor  History of Present Illness:  81 y.o. female  with a past medical history of hypertension, osteoporosis, anemia, hiatal hernia, diverticulitis, personal history of adenomatous polyps and others listed below, returns to clinic today for  evaluation of elevated liver function.  04/08/2019 colonoscopy due to history of adenomatous polyps 6 mm adenomatous polyp diverticula entire colon healthy mucosa at prior end-to-end colocolonic anastomosis in sigmoid colon internal hemorrhoids.  No recall secondary to age 24/06/2021 abdominal ultrasound for weight loss and hyperglycemia showed distended gallbladder without stones or thickening of Murphy mild increased renal echogenicity suggestive of chronic medical renal disease, heterogeneous liver parenchyma without focal lesion no biliary ductal dilatation  10/24/2021: AST 50 10/24/2021: ALT 26  10/24/2021: Alkaline Phosphatase 67  She body mass index is 16.02 kg/m.Marland Kitchen   The patient denies issues with jaundice, scleral icterus, dark urine, clay colored stool.  She denies Abdominal distention, LE edema. Denies generalized pruritus. No GERD, no nausea, vomiting. No issues with fatty foods.  She denies confusion.  She reports ETOH use, would drink 1/5 of vodka every 2 weeks.  Patient is not a smoker.  Takes tylenol 2 pills a night, no over the counter supplements.  She is not on a statin.  She denies tattoos, high risk sexual behavior, and IV drug use denies  family history of liver disease.History of blood transfusion in 1966.   External labs and notes reviewed this visit:  10/24/2021  HGB 10.7 MCV 91.1 with normocytic anemia WBC 2.7 Platelets 228 10/24/2021  Iron 162 Ferritin 582  10/24/2021  AST 50 ALT 26 Alkphos 67 TBili 1.0 01/30/2021  TSH 3.98  Current Medications:    Current Outpatient Medications (Cardiovascular):    hydrochlorothiazide (HYDRODIURIL) 25 MG tablet, Take 0.5 tablets (12.5 mg total) by mouth daily.   losartan (COZAAR) 50 MG tablet, Take 1 tablet (50 mg total) by mouth daily.   Current Outpatient Medications (Analgesics):    acetaminophen (TYLENOL) 500 MG tablet, Take 1 tablet (500 mg total) by mouth every 6 (  six) hours as needed.   colchicine 0.6 MG tablet,  TAKE 1 TABLET (0.6 MG TOTAL) BY MOUTH DAILY AS NEEDED (GOUT FLARE).  Current Outpatient Medications (Hematological):    vitamin B-12 (CYANOCOBALAMIN) 500 MCG tablet, Take 1 tablet (500 mcg total) by mouth once a week.  Current Outpatient Medications (Other):    Calcium Carb-Cholecalciferol 500-400 MG-UNIT TABS, Take by mouth.   Cholecalciferol (VITAMIN D3) 25 MCG (1000 UT) CAPS, Take by mouth.   mirtazapine (REMERON) 15 MG tablet, TAKE 1 TABLET BY MOUTH EVERYDAY AT BEDTIME   Multiple Vitamins-Minerals (CENTRUM PO), Take by mouth.   potassium chloride (KLOR-CON) 10 MEQ tablet, Take 1 tablet (10 mEq total) by mouth daily.   vitamin C (ASCORBIC ACID) 500 MG tablet, Take 500 mg by mouth daily.  Surgical History:  She  has a past surgical history that includes Laparoscopic sigmoid colectomy (1999?); Partial hysterectomy; Tubal ligation; Colonoscopy (04/2010); DEXA (08/2009); Colonoscopy (04/2014); DEXA (04/2015); Abdominal hysterectomy; Toe Surgery; and Colonoscopy (03/2019). Family History:  Her family history includes Allergic rhinitis in her daughter and son; Arthritis in her father and mother; Heart disease in her father; Hypertension in her father. Social History:   reports that she has never smoked. She has been exposed to tobacco smoke. She has never used smokeless tobacco. She reports current alcohol use of about 2.0 standard drinks per week. She reports that she does not use drugs.  Current Medications, Allergies, Past Medical History, Past Surgical History, Family History and Social History were reviewed in [employer] record.  Physical Exam: Ht 5' 1.5" (1.562 m)   Wt 86 lb 3.2 oz (39.1 kg)   BMI 16.02 kg/m  General :  Alert, well developed female in no acute distress Head:  Normocephalic and atraumatic. Eyes :  sclerae anicteric,conjunctive pink  Heart:  regular rate and rhythm, occ extra systole Pulm:  Clear anteriorly; no wheezing Abdomen:   Soft,   nondistended  AB, skin exam normal, Normal bowel sounds.  no  tenderness . , .   fluid wave,   shifting dullness.  Extremities:    edema. Msk:  Symmetrical without gross deformities. Peripheral pulses intact.  Neurologic: Alert and  oriented x4;  grossly normal neurologically.  asterixis or clonus.  Skin:  without  jaundice.    Psychiatric:  Demonstrates good judgement and reason without abnormal affect or behaviors.   LANAYA BENNIS, PA-C 12/10/21

## 2021-12-10 ENCOUNTER — Other Ambulatory Visit (INDEPENDENT_AMBULATORY_CARE_PROVIDER_SITE_OTHER): Payer: Medicare HMO

## 2021-12-10 ENCOUNTER — Ambulatory Visit: Payer: Medicare HMO | Admitting: Physician Assistant

## 2021-12-10 ENCOUNTER — Encounter: Payer: Self-pay | Admitting: Physician Assistant

## 2021-12-10 VITALS — BP 118/62 | HR 68 | Ht 61.5 in | Wt 86.2 lb

## 2021-12-10 DIAGNOSIS — R7989 Other specified abnormal findings of blood chemistry: Secondary | ICD-10-CM | POA: Diagnosis not present

## 2021-12-10 DIAGNOSIS — K760 Fatty (change of) liver, not elsewhere classified: Secondary | ICD-10-CM

## 2021-12-10 DIAGNOSIS — D649 Anemia, unspecified: Secondary | ICD-10-CM | POA: Diagnosis not present

## 2021-12-10 DIAGNOSIS — E538 Deficiency of other specified B group vitamins: Secondary | ICD-10-CM | POA: Diagnosis not present

## 2021-12-10 DIAGNOSIS — N1831 Chronic kidney disease, stage 3a: Secondary | ICD-10-CM | POA: Diagnosis not present

## 2021-12-10 DIAGNOSIS — Z86010 Personal history of colonic polyps: Secondary | ICD-10-CM | POA: Diagnosis not present

## 2021-12-10 DIAGNOSIS — Z860101 Personal history of adenomatous and serrated colon polyps: Secondary | ICD-10-CM

## 2021-12-10 LAB — PROTIME-INR
INR: 1 ratio (ref 0.8–1.0)
Prothrombin Time: 11.4 s (ref 9.6–13.1)

## 2021-12-10 LAB — CBC WITH DIFFERENTIAL/PLATELET
Basophils Absolute: 0 10*3/uL (ref 0.0–0.1)
Basophils Relative: 0.4 % (ref 0.0–3.0)
Eosinophils Absolute: 0 10*3/uL (ref 0.0–0.7)
Eosinophils Relative: 0.7 % (ref 0.0–5.0)
HCT: 30.3 % — ABNORMAL LOW (ref 36.0–46.0)
Hemoglobin: 10.1 g/dL — ABNORMAL LOW (ref 12.0–15.0)
Lymphocytes Relative: 19.3 % (ref 12.0–46.0)
Lymphs Abs: 0.9 10*3/uL (ref 0.7–4.0)
MCHC: 33.3 g/dL (ref 30.0–36.0)
MCV: 90.2 fl (ref 78.0–100.0)
Monocytes Absolute: 0.8 10*3/uL (ref 0.1–1.0)
Monocytes Relative: 16.5 % — ABNORMAL HIGH (ref 3.0–12.0)
Neutro Abs: 3 10*3/uL (ref 1.4–7.7)
Neutrophils Relative %: 63.1 % (ref 43.0–77.0)
Platelets: 364 10*3/uL (ref 150.0–400.0)
RBC: 3.36 Mil/uL — ABNORMAL LOW (ref 3.87–5.11)
RDW: 12.4 % (ref 11.5–15.5)
WBC: 4.8 10*3/uL (ref 4.0–10.5)

## 2021-12-10 LAB — COMPREHENSIVE METABOLIC PANEL
ALT: 11 U/L (ref 0–35)
AST: 17 U/L (ref 0–37)
Albumin: 4.2 g/dL (ref 3.5–5.2)
Alkaline Phosphatase: 51 U/L (ref 39–117)
BUN: 44 mg/dL — ABNORMAL HIGH (ref 6–23)
CO2: 26 mEq/L (ref 19–32)
Calcium: 10.2 mg/dL (ref 8.4–10.5)
Chloride: 99 mEq/L (ref 96–112)
Creatinine, Ser: 1.3 mg/dL — ABNORMAL HIGH (ref 0.40–1.20)
GFR: 38.75 mL/min — ABNORMAL LOW (ref >60.00)
Glucose, Bld: 103 mg/dL — ABNORMAL HIGH (ref 70–99)
Potassium: 4.1 mEq/L (ref 3.5–5.1)
Sodium: 135 mEq/L (ref 135–145)
Total Bilirubin: 0.3 mg/dL (ref 0.2–1.2)
Total Protein: 8.2 g/dL (ref 6.0–8.3)

## 2021-12-10 NOTE — Patient Instructions (Addendum)
If you are age 81 or older, your body mass index should be between 23-30. Your Body mass index is 16.02 kg/m. If this is out of the aforementioned range listed, please consider follow up with your Primary Care Provider. ________________________________________________________  The [facility] providers would like to encourage you to use [facility] to communicate with providers for non-urgent requests or questions.  Due to long hold times on the telephone, sending your provider a message by [facility] may be a faster and more efficient way to get a response.  Please allow 48 business hours for a response.  Please remember that this is for non-urgent requests.  _______________________________________________________  Margaret Nguyen have been scheduled for an abdominal ultrasound at [facility] (1st floor of hospital) on 12/19/2021 at 10:00 am. Please arrive 15 minutes prior to your appointment for registration. Make certain not to have anything to eat or drink 6 hours prior to your appointment. Should you need to reschedule your appointment, please contact radiology at [phone]. This test typically takes about 30 minutes to perform.  Your provider has requested that you go to the basement level for lab work before leaving today. Press "B" on the elevator. The lab is located at the first door on the left as you exit the elevator.  Follow up as needed for now.  Thank you for entrusting me with your care and choosing [facility].  Vicie Mutters, PA-C  Fatty liver or Nonalcoholic fatty liver disease (NASH)  Now the leading cause of liver failure in the [place].  It is normally from such risk factors as obesity, diabetes, insulin resistance, high cholesterol, or metabolic syndrome.  The only definitive therapy is weight loss and exercise.    Suggest walking 20-30 mins daily.  Decreasing carbohydrates, increasing veggies.    Fatty Liver Fatty liver is the accumulation of fat in liver  cells. It is also called hepatosteatosis or steatohepatitis. It is normal for your liver to contain some fat. If fat is more than 5 to 10% of your liver's weight, you have fatty liver.  There are often no symptoms (problems) for years while damage is still occurring. People often learn about their fatty liver when they have medical tests for other reasons. Fat can damage your liver for years or even decades without causing problems. When it becomes severe, it can cause fatigue, weight loss, weakness, and confusion. This makes you more likely to develop more serious liver problems. The liver is the largest organ in the body. It does a lot of work and often gives no warning signs when it is sick until late in a disease. The liver has many important jobs including: Breaking down foods. Storing vitamins, iron, and other minerals. Making proteins. Making bile for food digestion. Breaking down many products including medications, alcohol and some poisons.  PROGNOSIS  Fatty liver may cause no damage or it can lead to an inflammation of the liver. This is, called steatohepatitis.  Over time the liver may become scarred and hardened. This condition is called cirrhosis. Cirrhosis is serious and may lead to liver failure or cancer. NASH is one of the leading causes of cirrhosis. About 10-20% of Americans have fatty liver and a smaller 2-5% has NASH.  TREATMENT  Weight loss, fat restriction, and exercise in overweight patients produces inconsistent results but is worth trying. Good control of diabetes may reduce fatty liver. Eat a balanced, healthy diet. Increase your physical activity. There are no medical or surgical treatments for a  fatty liver or NASH, but improving your diet and increasing your exercise may help prevent or reverse some of the damage.

## 2021-12-16 ENCOUNTER — Other Ambulatory Visit: Payer: Self-pay

## 2021-12-16 LAB — MITOCHONDRIAL ANTIBODIES: Mitochondrial M2 Ab, IgG: 31.9 U — ABNORMAL HIGH (ref <=20.0)

## 2021-12-16 LAB — IGG: IgG (Immunoglobin G), Serum: 1523 mg/dL (ref 600–1540)

## 2021-12-16 LAB — ANTI-NUCLEAR AB-TITER (ANA TITER)
ANA TITER: 1:80 {titer} — ABNORMAL HIGH
ANA Titer 1: 1:80 {titer} — ABNORMAL HIGH

## 2021-12-16 LAB — ANTI-SMOOTH MUSCLE ANTIBODY, IGG: Actin (Smooth Muscle) Antibody (IGG): <20 U (ref <20)

## 2021-12-16 LAB — ANA: Anti Nuclear Antibody (ANA): POSITIVE — AB

## 2021-12-16 MED ORDER — EPINEPHRINE 0.3 MG/0.3ML IJ SOAJ: 0.3000 mg | Freq: Once | INTRAMUSCULAR | 1 refills | Status: AC

## 2021-12-19 ENCOUNTER — Ambulatory Visit (HOSPITAL_COMMUNITY)
Admission: RE | Admit: 2021-12-19 | Discharge: 2021-12-19 | Disposition: A | Discharge status: Home / Self Care | Payer: Medicare HMO | Source: Ambulatory Visit | Attending: Physician Assistant | Admitting: Physician Assistant

## 2021-12-19 DIAGNOSIS — R7989 Other specified abnormal findings of blood chemistry: Secondary | ICD-10-CM | POA: Diagnosis not present

## 2021-12-19 DIAGNOSIS — R945 Abnormal results of liver function studies: Secondary | ICD-10-CM | POA: Diagnosis not present

## 2021-12-19 DIAGNOSIS — K760 Fatty (change of) liver, not elsewhere classified: Secondary | ICD-10-CM | POA: Insufficient documentation

## 2021-12-29 ENCOUNTER — Telehealth: Payer: Self-pay

## 2021-12-29 NOTE — Telephone Encounter (Signed)
Left message for patient to call back regarding [place] results.

## 2021-12-30 NOTE — Telephone Encounter (Signed)
Left message for patient to call back  

## 2021-12-31 NOTE — Telephone Encounter (Signed)
Patient returned call & has been given [place] results. No further questions.

## 2021-12-31 NOTE — Telephone Encounter (Signed)
Unable to reach patient x 3 with detailed message for patient to call back in regards to results. Mychart message has been sent to patient as well.

## 2022-01-30 ENCOUNTER — Ambulatory Visit (INDEPENDENT_AMBULATORY_CARE_PROVIDER_SITE_OTHER): Payer: Medicare HMO

## 2022-01-30 VITALS — Ht 61.5 in | Wt 86.0 lb

## 2022-01-30 DIAGNOSIS — Z0000 Encounter for general adult medical examination without abnormal findings: Secondary | ICD-10-CM | POA: Diagnosis not present
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
## 2022-01-30 NOTE — Patient Instructions (Addendum)
Margaret Nguyen , Thank you for taking time to come for your Medicare Wellness Visit. I appreciate your ongoing commitment to your health goals. Please review the following plan we discussed and let me know if I can assist you in the future.   These are the goals we discussed:  Goals       No current falls (pt-stated)      Patient Stated      Starting 01/12/2018, I will continue to take medications as prescribed.       Patient Stated      01/29/2021, I will maintain and continue medications as prescribed.         This is a list of the screening recommended for you and due dates:  Health Maintenance  Topic Date Due   Zoster (Shingles) Vaccine (1 of 2) 05/02/2022*   Tetanus Vaccine  01/30/2024*   Flu Shot  02/10/2022   Mammogram  03/11/2022   Pneumonia Vaccine  Completed   DEXA scan (bone density measurement)  Completed   COVID-19 Vaccine  Completed   HPV Vaccine  Aged Out   Colon Cancer Screening  Discontinued  *Topic was postponed. The date shown is not the original due date.   Advanced directives: Yes  Conditions/risks identified: None  Next appointment: Follow up in one year for your annual wellness visit     Preventive Care 65 Years and Older, Female Preventive care refers to lifestyle choices and visits with your health care provider that can promote health and wellness. What does preventive care include? A yearly physical exam. This is also called an annual well check. Dental exams once or twice a year. Routine eye exams. Ask your health care provider how often you should have your eyes checked. Personal lifestyle choices, including: Daily care of your teeth and gums. Regular physical activity. Eating a healthy diet. Avoiding tobacco and drug use. Limiting alcohol use. Practicing safe sex. Taking low-dose aspirin every day. Taking vitamin and mineral supplements as recommended by your health care provider. What happens during an annual well check? The services  and screenings done by your health care provider during your annual well check will depend on your age, overall health, lifestyle risk factors, and family history of disease. Counseling  Your health care provider may ask you questions about your: Alcohol use. Tobacco use. Drug use. Emotional well-being. Home and relationship well-being. Sexual activity. Eating habits. History of falls. Memory and ability to understand (cognition). Work and work Statistician. Reproductive health. Screening  You may have the following tests or measurements: Height, weight, and BMI. Blood pressure. Lipid and cholesterol levels. These may be checked every 5 years, or more frequently if you are over 77 years old. Skin check. Lung cancer screening. You may have this screening every year starting at age 45 if you have a 30-pack-year history of smoking and currently smoke or have quit within the past 15 years. Fecal occult blood test (FOBT) of the stool. You may have this test every year starting at age 66. Flexible sigmoidoscopy or colonoscopy. You may have a sigmoidoscopy every 5 years or a colonoscopy every 10 years starting at age 48. Hepatitis C blood test. Hepatitis B blood test. Sexually transmitted disease (STD) testing. Diabetes screening. This is done by checking your blood sugar (glucose) after you have not eaten for a while (fasting). You may have this done every 1-3 years. Bone density scan. This is done to screen for osteoporosis. You may have this done starting at age  65. Mammogram. This may be done every 1-2 years. Talk to your health care provider about how often you should have regular mammograms. Talk with your health care provider about your test results, treatment options, and if necessary, the need for more tests. Vaccines  Your health care provider may recommend certain vaccines, such as: Influenza vaccine. This is recommended every year. Tetanus, diphtheria, and acellular pertussis  (Tdap, Td) vaccine. You may need a Td booster every 10 years. Zoster vaccine. You may need this after age 83. Pneumococcal 13-valent conjugate (PCV13) vaccine. One dose is recommended after age 51. Pneumococcal polysaccharide (PPSV23) vaccine. One dose is recommended after age 45. Talk to your health care provider about which screenings and vaccines you need and how often you need them. This information is not intended to replace advice given to you by your health care provider. Make sure you discuss any questions you have with your health care provider. Document Released: 07/26/2015 Document Revised: 03/18/2016 Document Reviewed: 04/30/2015 Elsevier Interactive Patient Education  2017 [employer] Prevention in the Home Falls can cause injuries. They can happen to people of all ages. There are many things you can do to make your home safe and to help prevent falls. What can I do on the outside of my home? Regularly fix the edges of walkways and driveways and fix any cracks. Remove anything that might make you trip as you walk through a door, such as a raised step or threshold. Trim any bushes or trees on the path to your home. Use bright outdoor lighting. Clear any walking paths of anything that might make someone trip, such as rocks or tools. Regularly check to see if handrails are loose or broken. Make sure that both sides of any steps have handrails. Any raised decks and porches should have guardrails on the edges. Have any leaves, snow, or ice cleared regularly. Use sand or salt on walking paths during winter. Clean up any spills in your garage right away. This includes oil or grease spills. What can I do in the bathroom? Use night lights. Install grab bars by the toilet and in the tub and shower. Do not use towel bars as grab bars. Use non-skid mats or decals in the tub or shower. If you need to sit down in the shower, use a plastic, non-slip stool. Keep the floor dry. Clean  up any water that spills on the floor as soon as it happens. Remove soap buildup in the tub or shower regularly. Attach bath mats securely with double-sided non-slip rug tape. Do not have throw rugs and other things on the floor that can make you trip. What can I do in the bedroom? Use night lights. Make sure that you have a light by your bed that is easy to reach. Do not use any sheets or blankets that are too big for your bed. They should not hang down onto the floor. Have a firm chair that has side arms. You can use this for support while you get dressed. Do not have throw rugs and other things on the floor that can make you trip. What can I do in the kitchen? Clean up any spills right away. Avoid walking on wet floors. Keep items that you use a lot in easy-to-reach places. If you need to reach something above you, use a strong step stool that has a grab bar. Keep electrical cords out of the way. Do not use floor polish or wax that makes floors slippery.  If you must use wax, use non-skid floor wax. Do not have throw rugs and other things on the floor that can make you trip. What can I do with my stairs? Do not leave any items on the stairs. Make sure that there are handrails on both sides of the stairs and use them. Fix handrails that are broken or loose. Make sure that handrails are as long as the stairways. Check any carpeting to make sure that it is firmly attached to the stairs. Fix any carpet that is loose or worn. Avoid having throw rugs at the top or bottom of the stairs. If you do have throw rugs, attach them to the floor with carpet tape. Make sure that you have a light switch at the top of the stairs and the bottom of the stairs. If you do not have them, ask someone to add them for you. What else can I do to help prevent falls? Wear shoes that: Do not have high heels. Have rubber bottoms. Are comfortable and fit you well. Are closed at the toe. Do not wear sandals. If you  use a stepladder: Make sure that it is fully opened. Do not climb a closed stepladder. Make sure that both sides of the stepladder are locked into place. Ask someone to hold it for you, if possible. Clearly mark and make sure that you can see: Any grab bars or handrails. First and last steps. Where the edge of each step is. Use tools that help you move around (mobility aids) if they are needed. These include: Canes. Walkers. Scooters. Crutches. Turn on the lights when you go into a dark area. Replace any light bulbs as soon as they burn out. Set up your furniture so you have a clear path. Avoid moving your furniture around. If any of your floors are uneven, fix them. If there are any pets around you, be aware of where they are. Review your medicines with your doctor. Some medicines can make you feel dizzy. This can increase your chance of falling. Ask your doctor what other things that you can do to help prevent falls. This information is not intended to replace advice given to you by your health care provider. Make sure you discuss any questions you have with your health care provider. Document Released: 04/25/2009 Document Revised: 12/05/2015 Document Reviewed: 08/03/2014 Elsevier Interactive Patient Education  2017 [employer].

## 2022-02-14 ENCOUNTER — Other Ambulatory Visit: Payer: Self-pay | Admitting: Family Medicine

## 2022-02-14 DIAGNOSIS — M810 Age-related osteoporosis without current pathological fracture: Secondary | ICD-10-CM

## 2022-02-14 DIAGNOSIS — E038 Other specified hypothyroidism: Secondary | ICD-10-CM

## 2022-02-14 DIAGNOSIS — Z789 Other specified health status: Secondary | ICD-10-CM

## 2022-02-14 DIAGNOSIS — E538 Deficiency of other specified B group vitamins: Secondary | ICD-10-CM

## 2022-02-14 DIAGNOSIS — E785 Hyperlipidemia, unspecified: Secondary | ICD-10-CM

## 2022-02-14 DIAGNOSIS — N1831 Chronic kidney disease, stage 3a: Secondary | ICD-10-CM

## 2022-02-14 DIAGNOSIS — R7989 Other specified abnormal findings of blood chemistry: Secondary | ICD-10-CM

## 2022-02-14 DIAGNOSIS — D649 Anemia, unspecified: Secondary | ICD-10-CM

## 2022-02-17 ENCOUNTER — Other Ambulatory Visit (INDEPENDENT_AMBULATORY_CARE_PROVIDER_SITE_OTHER): Payer: Medicare HMO

## 2022-02-17 DIAGNOSIS — D649 Anemia, unspecified: Secondary | ICD-10-CM

## 2022-02-17 DIAGNOSIS — Z789 Other specified health status: Secondary | ICD-10-CM | POA: Diagnosis not present

## 2022-02-17 DIAGNOSIS — N1831 Chronic kidney disease, stage 3a: Secondary | ICD-10-CM | POA: Diagnosis not present

## 2022-02-17 DIAGNOSIS — E038 Other specified hypothyroidism: Secondary | ICD-10-CM | POA: Diagnosis not present

## 2022-02-17 DIAGNOSIS — E785 Hyperlipidemia, unspecified: Secondary | ICD-10-CM

## 2022-02-17 DIAGNOSIS — M810 Age-related osteoporosis without current pathological fracture: Secondary | ICD-10-CM | POA: Diagnosis not present

## 2022-02-17 DIAGNOSIS — E538 Deficiency of other specified B group vitamins: Secondary | ICD-10-CM | POA: Diagnosis not present

## 2022-02-17 DIAGNOSIS — R7989 Other specified abnormal findings of blood chemistry: Secondary | ICD-10-CM

## 2022-02-17 LAB — COMPREHENSIVE METABOLIC PANEL
ALT: 14 U/L (ref 0–35)
AST: 22 U/L (ref 0–37)
Albumin: 4.5 g/dL (ref 3.5–5.2)
Alkaline Phosphatase: 47 U/L (ref 39–117)
BUN: 43 mg/dL — ABNORMAL HIGH (ref 6–23)
CO2: 28 mEq/L (ref 19–32)
Calcium: 10 mg/dL (ref 8.4–10.5)
Chloride: 98 mEq/L (ref 96–112)
Creatinine, Ser: 1.24 mg/dL — ABNORMAL HIGH (ref 0.40–1.20)
GFR: 40.96 mL/min — ABNORMAL LOW (ref >60.00)
Glucose, Bld: 162 mg/dL — ABNORMAL HIGH (ref 70–99)
Potassium: 4.2 mEq/L (ref 3.5–5.1)
Sodium: 138 mEq/L (ref 135–145)
Total Bilirubin: 0.8 mg/dL (ref 0.2–1.2)
Total Protein: 8 g/dL (ref 6.0–8.3)

## 2022-02-17 LAB — VITAMIN D 25 HYDROXY (VIT D DEFICIENCY, FRACTURES): VITD: 74.21 ng/mL (ref 30.00–100.00)

## 2022-02-17 LAB — IBC PANEL
Iron: 102 ug/dL (ref 42–145)
Saturation Ratios: 24.8 % (ref 20.0–50.0)
TIBC: 411.6 ug/dL (ref 250.0–450.0)
Transferrin: 294 mg/dL (ref 212.0–360.0)

## 2022-02-17 LAB — CBC WITH DIFFERENTIAL/PLATELET
Basophils Absolute: 0.1 10*3/uL (ref 0.0–0.1)
Basophils Relative: 1.6 % (ref 0.0–3.0)
Eosinophils Absolute: 0 10*3/uL (ref 0.0–0.7)
Eosinophils Relative: 0.8 % (ref 0.0–5.0)
HCT: 33 % — ABNORMAL LOW (ref 36.0–46.0)
Hemoglobin: 10.9 g/dL — ABNORMAL LOW (ref 12.0–15.0)
Lymphocytes Relative: 24.8 % (ref 12.0–46.0)
Lymphs Abs: 0.8 10*3/uL (ref 0.7–4.0)
MCHC: 33 g/dL (ref 30.0–36.0)
MCV: 85.1 fl (ref 78.0–100.0)
Monocytes Absolute: 0.5 10*3/uL (ref 0.1–1.0)
Monocytes Relative: 16 % — ABNORMAL HIGH (ref 3.0–12.0)
Neutro Abs: 1.9 10*3/uL (ref 1.4–7.7)
Neutrophils Relative %: 56.8 % (ref 43.0–77.0)
Platelets: 238 10*3/uL (ref 150.0–400.0)
RBC: 3.88 Mil/uL (ref 3.87–5.11)
RDW: 13.2 % (ref 11.5–15.5)
WBC: 3.3 10*3/uL — ABNORMAL LOW (ref 4.0–10.5)

## 2022-02-17 LAB — FERRITIN: Ferritin: 278.2 ng/mL (ref 10.0–291.0)

## 2022-02-17 LAB — TSH: TSH: 5.57 u[IU]/mL — ABNORMAL HIGH (ref 0.35–5.50)

## 2022-02-17 LAB — LIPID PANEL
Cholesterol: 181 mg/dL (ref 0–200)
HDL: 84.8 mg/dL (ref >39.00)
LDL Cholesterol: 75 mg/dL (ref 0–99)
NonHDL: 96.27
Total CHOL/HDL Ratio: 2
Triglycerides: 104 mg/dL (ref 0.0–149.0)
VLDL: 20.8 mg/dL (ref 0.0–40.0)

## 2022-02-17 LAB — VITAMIN B12: Vitamin B-12: 845 pg/mL (ref 211–911)

## 2022-02-17 LAB — T4, FREE: Free T4: 0.65 ng/dL (ref 0.60–1.60)

## 2022-02-19 ENCOUNTER — Ambulatory Visit: Payer: Medicare HMO | Admitting: Allergy & Immunology

## 2022-02-21 ENCOUNTER — Other Ambulatory Visit: Payer: Self-pay | Admitting: Family Medicine

## 2022-02-21 LAB — VITAMIN B1: Vitamin B1 (Thiamine): 26 nmol/L (ref 8–30)

## 2022-02-21 LAB — PARATHYROID HORMONE, INTACT (NO CA): PTH: 49 pg/mL (ref 16–77)

## 2022-02-22 ENCOUNTER — Other Ambulatory Visit: Payer: Self-pay | Admitting: Family Medicine

## 2022-02-25 ENCOUNTER — Encounter: Payer: Self-pay | Admitting: Family Medicine

## 2022-02-25 ENCOUNTER — Ambulatory Visit (INDEPENDENT_AMBULATORY_CARE_PROVIDER_SITE_OTHER): Payer: Medicare HMO | Admitting: Family Medicine

## 2022-02-25 ENCOUNTER — Telehealth: Payer: Self-pay | Admitting: Family Medicine

## 2022-02-25 VITALS — BP 120/70 | HR 87 | Temp 97.4°F | Ht 61.0 in | Wt 85.4 lb

## 2022-02-25 DIAGNOSIS — N1831 Chronic kidney disease, stage 3a: Secondary | ICD-10-CM | POA: Diagnosis not present

## 2022-02-25 DIAGNOSIS — Z0000 Encounter for general adult medical examination without abnormal findings: Secondary | ICD-10-CM | POA: Diagnosis not present

## 2022-02-25 DIAGNOSIS — E038 Other specified hypothyroidism: Secondary | ICD-10-CM

## 2022-02-25 DIAGNOSIS — E538 Deficiency of other specified B group vitamins: Secondary | ICD-10-CM | POA: Diagnosis not present

## 2022-02-25 DIAGNOSIS — M810 Age-related osteoporosis without current pathological fracture: Secondary | ICD-10-CM

## 2022-02-25 DIAGNOSIS — Z91013 Allergy to seafood: Secondary | ICD-10-CM

## 2022-02-25 DIAGNOSIS — R7401 Elevation of levels of liver transaminase levels: Secondary | ICD-10-CM

## 2022-02-25 DIAGNOSIS — E785 Hyperlipidemia, unspecified: Secondary | ICD-10-CM

## 2022-02-25 DIAGNOSIS — F1090 Alcohol use, unspecified, uncomplicated: Secondary | ICD-10-CM | POA: Diagnosis not present

## 2022-02-25 DIAGNOSIS — I10 Essential (primary) hypertension: Secondary | ICD-10-CM | POA: Diagnosis not present

## 2022-02-25 DIAGNOSIS — Z8679 Personal history of other diseases of the circulatory system: Secondary | ICD-10-CM

## 2022-02-25 DIAGNOSIS — R634 Abnormal weight loss: Secondary | ICD-10-CM | POA: Diagnosis not present

## 2022-02-25 DIAGNOSIS — D649 Anemia, unspecified: Secondary | ICD-10-CM

## 2022-02-25 MED ORDER — HYDROCHLOROTHIAZIDE 25 MG PO TABS
25.0000 mg | ORAL_TABLET | Freq: Every day | ORAL | 3 refills | Status: DC
Start: 2022-02-25 — End: 2022-02-25

## 2022-02-25 MED ORDER — POTASSIUM CHLORIDE ER 10 MEQ PO TBCR: 10.0000 meq | EXTENDED_RELEASE_TABLET | Freq: Every day | ORAL | 3 refills | Status: DC

## 2022-02-25 MED ORDER — HYDROCHLOROTHIAZIDE 25 MG PO TABS
12.5000 mg | ORAL_TABLET | Freq: Every day | ORAL | 3 refills | Status: DC
Start: 2022-02-25 — End: 2022-08-28

## 2022-02-25 MED ORDER — LOSARTAN POTASSIUM 50 MG PO TABS
ORAL_TABLET | ORAL | 3 refills | Status: DC
Start: 2022-02-25 — End: 2023-02-26

## 2022-02-25 NOTE — Telephone Encounter (Addendum)
Continue 1/2 hctz tablet daily. New sig sent to pharmacy.  Continue other meds as up to now.
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
## 2022-02-25 NOTE — Telephone Encounter (Signed)
Spoke with pt asking which BP med she only take 1/2 pill.  Pt says it's HCTZ.  She's asking if she should go back to taking a whole tab.  Plz advise.

## 2022-02-25 NOTE — Addendum Note (Signed)
Addended by: Ria Bush on: 02/25/2022 04:58 PM   Modules accepted: Orders

## 2022-02-25 NOTE — Telephone Encounter (Signed)
Patient called back about the medication she is taken hydrochlorothiazide hydrochlorothiazide (HYDRODIURIL) 25 MG tablet, potassium chloride (KLOR-CON) 10 MEQ tablet, losartan losartan (COZAAR) 50 MG tablet. Call back number [phone].

## 2022-02-25 NOTE — Patient Instructions (Addendum)
Double check on blood pressure medicines at home - let us know which you're taking 1/2 of.  Get mammograms every other year  If interested, check with pharmacy about new 2 shot shingles series (shingrix).  Schedule eye exam as you're due.  Stay off alcohol.  Return in 6 months for follow up visit.  Happy belated birthday!  Health Maintenance After Age 81 After age 67, you are at a higher risk for certain long-term diseases and infections as well as injuries from falls. Falls are a major cause of broken bones and head injuries in people who are older than age 27. Getting regular preventive care can help to keep you healthy and well. Preventive care includes getting regular testing and making lifestyle changes as recommended by your health care provider. Talk with your health care provider about: Which screenings and tests you should have. A screening is a test that checks for a disease when you have no symptoms. A diet and exercise plan that is right for you. What should I know about screenings and tests to prevent falls? Screening and testing are the best ways to find a health problem early. Early diagnosis and treatment give you the best chance of managing medical conditions that are common after age 80. Certain conditions and lifestyle choices may make you more likely to have a fall. Your health care provider may recommend: Regular vision checks. Poor vision and conditions such as cataracts can make you more likely to have a fall. If you wear glasses, make sure to get your prescription updated if your vision changes. Medicine review. Work with your health care provider to regularly review all of the medicines you are taking, including over-the-counter medicines. Ask your health care provider about any side effects that may make you more likely to have a fall. Tell your health care provider if any medicines that you take make you feel dizzy or sleepy. Strength and balance checks. Your health care  provider may recommend certain tests to check your strength and balance while standing, walking, or changing positions. Foot health exam. Foot pain and numbness, as well as not wearing proper footwear, can make you more likely to have a fall. Screenings, including: Osteoporosis screening. Osteoporosis is a condition that causes the bones to get weaker and break more easily. Blood pressure screening. Blood pressure changes and medicines to control blood pressure can make you feel dizzy. Depression screening. You may be more likely to have a fall if you have a fear of falling, feel depressed, or feel unable to do activities that you used to do. Alcohol use screening. Using too much alcohol can affect your balance and may make you more likely to have a fall. Follow these instructions at home: Lifestyle Do not drink alcohol if: Your health care provider tells you not to drink. If you drink alcohol: Limit how much you have to: 0-1 drink a day for women. 0-2 drinks a day for men. Know how much alcohol is in your drink. In the [place] one drink equals one 12 oz bottle of beer (355 mL), one 5 oz glass of wine (148 mL), or one 1 oz glass of hard liquor (44 mL). Do not use any products that contain nicotine or tobacco. These products include cigarettes, chewing tobacco, and vaping devices, such as e-cigarettes. If you need help quitting, ask your health care provider. Activity  Follow a regular exercise program to stay fit. This will help you maintain your balance. Ask your health care provider  what types of exercise are appropriate for you. If you need a cane or walker, use it as recommended by your health care provider. Wear supportive shoes that have nonskid soles. Safety  Remove any tripping hazards, such as rugs, cords, and clutter. Install safety equipment such as grab bars in bathrooms and safety rails on stairs. Keep rooms and walkways well-lit. General instructions Talk with your health  care provider about your risks for falling. Tell your health care provider if: You fall. Be sure to tell your health care provider about all falls, even ones that seem minor. You feel dizzy, tiredness (fatigue), or off-balance. Take over-the-counter and prescription medicines only as told by your health care provider. These include supplements. Eat a healthy diet and maintain a healthy weight. A healthy diet includes low-fat dairy products, low-fat (lean) meats, and fiber from whole grains, beans, and lots of fruits and vegetables. Stay current with your vaccines. Schedule regular health, dental, and eye exams. Summary Having a healthy lifestyle and getting preventive care can help to protect your health and wellness after age 35. Screening and testing are the best way to find a health problem early and help you avoid having a fall. Early diagnosis and treatment give you the best chance for managing medical conditions that are more common for people who are older than age 53. Falls are a major cause of broken bones and head injuries in people who are older than age 79. Take precautions to prevent a fall at home. Work with your health care provider to learn what changes you can make to improve your health and wellness and to prevent falls. This information is not intended to replace advice given to you by your health care provider. Make sure you discuss any questions you have with your health care provider. Document Revised: 11/18/2020 Document Reviewed: 11/18/2020 Elsevier Patient Education  [place].

## 2022-02-26 DIAGNOSIS — F1090 Alcohol use, unspecified, uncomplicated: Secondary | ICD-10-CM | POA: Insufficient documentation

## 2022-02-26 DIAGNOSIS — R7401 Elevation of levels of liver transaminase levels: Secondary | ICD-10-CM | POA: Insufficient documentation

## 2022-02-26 MED ORDER — VITAMIN B-12 500 MCG PO TABS: 500.0000 ug | ORAL_TABLET | ORAL | Status: DC

## 2022-02-26 NOTE — Assessment & Plan Note (Addendum)
Avoiding shellfish. Now has EpiPen.  Continue ARB in place of ACEI.

## 2022-02-26 NOTE — Assessment & Plan Note (Signed)
Congratulated on significantly cutting down on alcohol intake.

## 2022-02-26 NOTE — Assessment & Plan Note (Signed)
Chronic, table period on vitamin B12 twice weekly.

## 2022-02-26 NOTE — Assessment & Plan Note (Addendum)
Appreciate hematology care, thought alcohol related. She has significantly cut down on alcohol intake.

## 2022-02-26 NOTE — Assessment & Plan Note (Signed)
Chronic, stable on current regimen including losartan and 1/2 tab hctz (in h/o hyponatremia)

## 2022-02-26 NOTE — Assessment & Plan Note (Addendum)
Chronic, stable period off statin. The ASCVD Risk score (Arnett DK, et al., 2019) failed to calculate for the following reasons:   The 2019 ASCVD risk score is only valid for ages 23 to 40

## 2022-02-26 NOTE — Telephone Encounter (Signed)
Patient called back and relayed Dr. Darnell Level message regarding the HCTZ tablet. Thank you!

## 2022-02-26 NOTE — Assessment & Plan Note (Addendum)
Unable to tolerate oral bisphosphonate (cramping). prolia was unaffordable. Consider IV bisphosphonate. She will continue calcium, vitamin D and walking, consider repeat DEXA 2024.

## 2022-02-26 NOTE — Assessment & Plan Note (Addendum)
Thyroid function remains borderline low - will continue to monitor as she is not interested in medication at this time.

## 2022-02-26 NOTE — Assessment & Plan Note (Signed)
Preventative protocols reviewed and updated unless pt declined. Discussed healthy diet and lifestyle.  

## 2022-02-26 NOTE — Assessment & Plan Note (Signed)
Latest GFR stable 40s.

## 2022-02-26 NOTE — Assessment & Plan Note (Signed)
Maintaining weight at 85lbs.  Has decided to stop remeron, wants to avoid any mood medication.

## 2022-02-26 NOTE — Assessment & Plan Note (Signed)
Liver US stable.  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 Saw GI, planned monitoring - will need to ensure has f/u in 1 yr

## 2022-02-26 NOTE — Telephone Encounter (Signed)
Lvm asking pt to call back.  Need to relay Dr. G's message.  

## 2022-03-11 ENCOUNTER — Other Ambulatory Visit: Payer: Self-pay | Admitting: Family Medicine

## 2022-03-11 NOTE — Telephone Encounter (Signed)
Refill Remeron Medication is no  longer list See office note 02/25/22 medication removed from list

## 2022-03-23 ENCOUNTER — Other Ambulatory Visit: Payer: Self-pay

## 2022-03-23 ENCOUNTER — Inpatient Hospital Stay: Payer: Medicare HMO | Attending: Oncology

## 2022-03-23 DIAGNOSIS — Z79899 Other long term (current) drug therapy: Secondary | ICD-10-CM | POA: Insufficient documentation

## 2022-03-23 DIAGNOSIS — R7989 Other specified abnormal findings of blood chemistry: Secondary | ICD-10-CM | POA: Insufficient documentation

## 2022-03-23 DIAGNOSIS — D649 Anemia, unspecified: Secondary | ICD-10-CM | POA: Insufficient documentation

## 2022-03-23 DIAGNOSIS — I129 Hypertensive chronic kidney disease with stage 1 through stage 4 chronic kidney disease, or unspecified chronic kidney disease: Secondary | ICD-10-CM | POA: Diagnosis not present

## 2022-03-23 DIAGNOSIS — K760 Fatty (change of) liver, not elsewhere classified: Secondary | ICD-10-CM | POA: Diagnosis not present

## 2022-03-23 DIAGNOSIS — Z8249 Family history of ischemic heart disease and other diseases of the circulatory system: Secondary | ICD-10-CM | POA: Insufficient documentation

## 2022-03-23 DIAGNOSIS — R768 Other specified abnormal immunological findings in serum: Secondary | ICD-10-CM | POA: Diagnosis not present

## 2022-03-23 DIAGNOSIS — Z8042 Family history of malignant neoplasm of prostate: Secondary | ICD-10-CM | POA: Insufficient documentation

## 2022-03-23 DIAGNOSIS — Z8261 Family history of arthritis: Secondary | ICD-10-CM | POA: Insufficient documentation

## 2022-03-23 DIAGNOSIS — R634 Abnormal weight loss: Secondary | ICD-10-CM | POA: Diagnosis not present

## 2022-03-23 DIAGNOSIS — Z8371 Family history of colonic polyps: Secondary | ICD-10-CM | POA: Insufficient documentation

## 2022-03-23 DIAGNOSIS — Z86010 Personal history of colonic polyps: Secondary | ICD-10-CM | POA: Insufficient documentation

## 2022-03-23 LAB — COMPREHENSIVE METABOLIC PANEL
ALT: 14 U/L (ref 0–44)
AST: 25 U/L (ref 15–41)
Albumin: 4.2 g/dL (ref 3.5–5.0)
Alkaline Phosphatase: 46 U/L (ref 38–126)
Anion gap: 8 (ref 5–15)
BUN: 31 mg/dL — ABNORMAL HIGH (ref 8–23)
CO2: 27 mmol/L (ref 22–32)
Calcium: 9.4 mg/dL (ref 8.9–10.3)
Chloride: 102 mmol/L (ref 98–111)
Creatinine, Ser: 1.15 mg/dL — ABNORMAL HIGH (ref 0.44–1.00)
GFR, Estimated: 48 mL/min — ABNORMAL LOW (ref >60)
Glucose, Bld: 117 mg/dL — ABNORMAL HIGH (ref 70–99)
Potassium: 4.1 mmol/L (ref 3.5–5.1)
Sodium: 137 mmol/L (ref 135–145)
Total Bilirubin: 0.5 mg/dL (ref 0.3–1.2)
Total Protein: 8 g/dL (ref 6.5–8.1)

## 2022-03-23 LAB — CBC WITH DIFFERENTIAL/PLATELET
Abs Immature Granulocytes: 0.01 10*3/uL (ref 0.00–0.07)
Basophils Absolute: 0 10*3/uL (ref 0.0–0.1)
Basophils Relative: 0 %
Eosinophils Absolute: 0 10*3/uL (ref 0.0–0.5)
Eosinophils Relative: 1 %
HCT: 32.8 % — ABNORMAL LOW (ref 36.0–46.0)
Hemoglobin: 10.4 g/dL — ABNORMAL LOW (ref 12.0–15.0)
Immature Granulocytes: 0 %
Lymphocytes Relative: 17 %
Lymphs Abs: 0.8 10*3/uL (ref 0.7–4.0)
MCH: 27.4 pg (ref 26.0–34.0)
MCHC: 31.7 g/dL (ref 30.0–36.0)
MCV: 86.5 fL (ref 80.0–100.0)
Monocytes Absolute: 0.7 10*3/uL (ref 0.1–1.0)
Monocytes Relative: 14 %
Neutro Abs: 3.4 10*3/uL (ref 1.7–7.7)
Neutrophils Relative %: 68 %
Platelets: 224 10*3/uL (ref 150–400)
RBC: 3.79 MIL/uL — ABNORMAL LOW (ref 3.87–5.11)
RDW: 13.7 % (ref 11.5–15.5)
WBC: 5 10*3/uL (ref 4.0–10.5)
nRBC: 0 % (ref 0.0–0.2)

## 2022-03-23 LAB — FERRITIN: Ferritin: 288 ng/mL (ref 11–307)

## 2022-03-24 ENCOUNTER — Inpatient Hospital Stay: Payer: Medicare HMO | Admitting: Oncology

## 2022-03-24 ENCOUNTER — Telehealth: Payer: Self-pay | Admitting: Oncology

## 2022-03-24 IMAGING — US US ABDOMEN COMPLETE
1 series · 13 of 25 positions shown · non-contrast
Comparison: No prior abdominal imaging.

CLINICAL DATA: Weight loss.  Hyperglycemia.

EXAM:
ABDOMEN ULTRASOUND COMPLETE

[Series 1: us abdomen complete · 0.14mm/px · 13 of 73 slices shown]
[im 1/73]
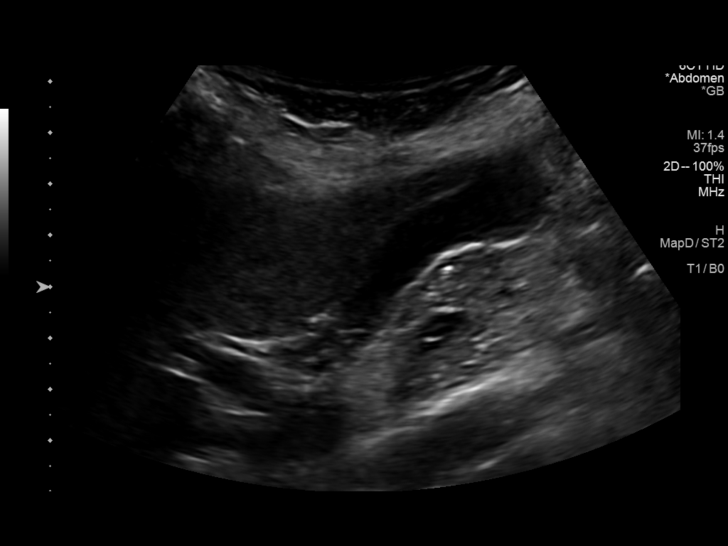
[im 7/73]
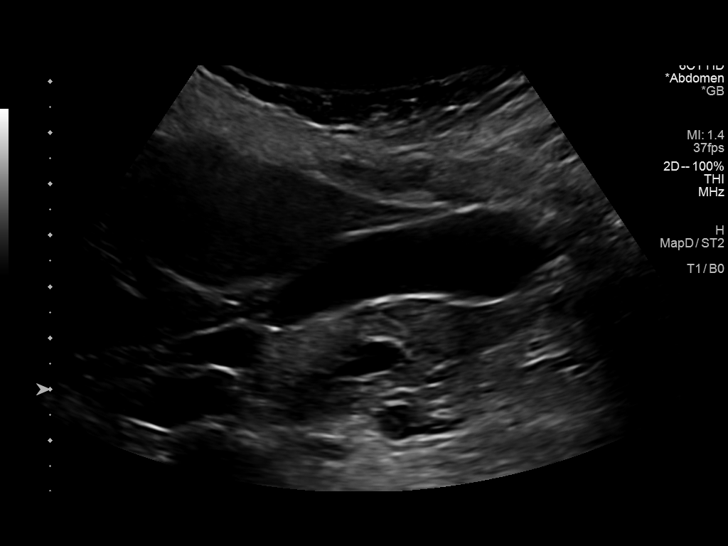
[im 13/73]
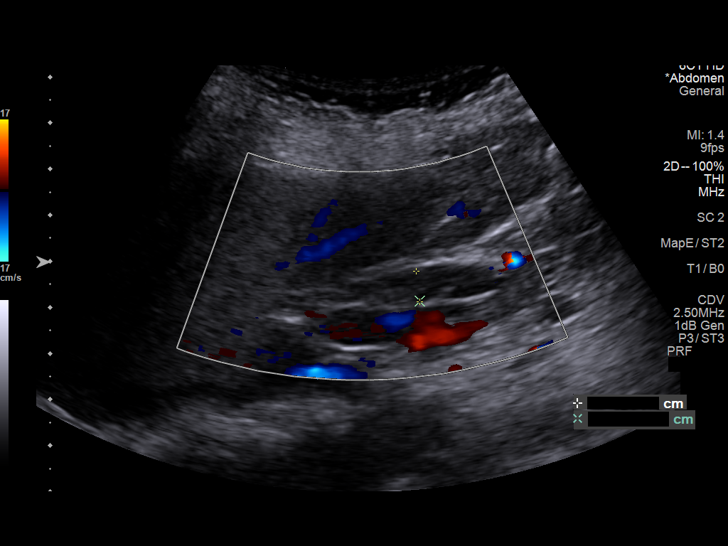
[im 19/73]
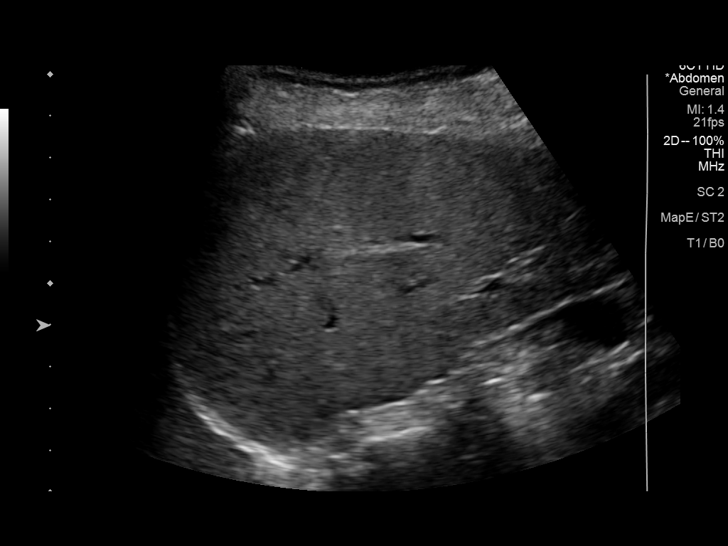
[im 25/73]
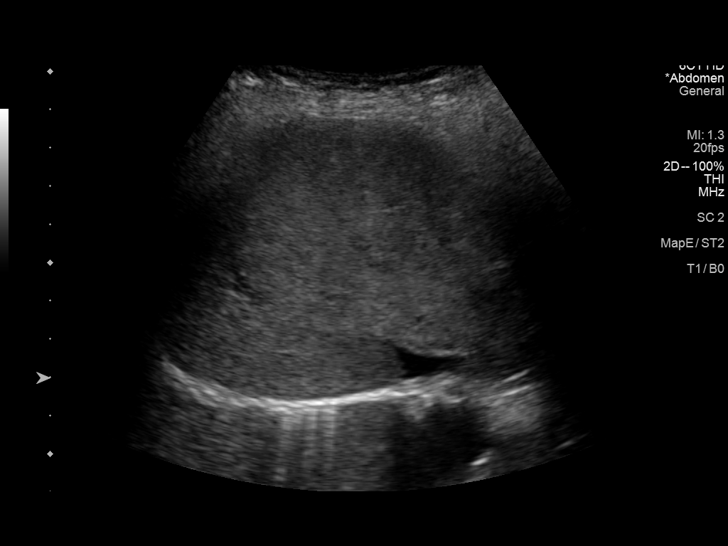
[im 31/73]
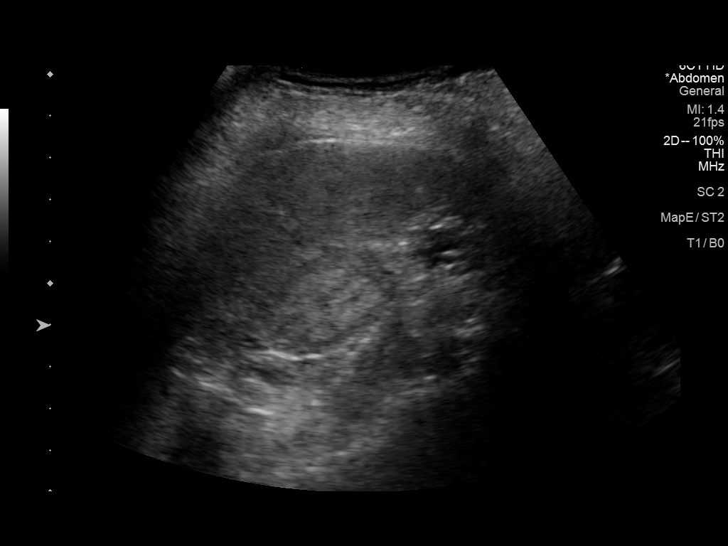
[im 37/73]
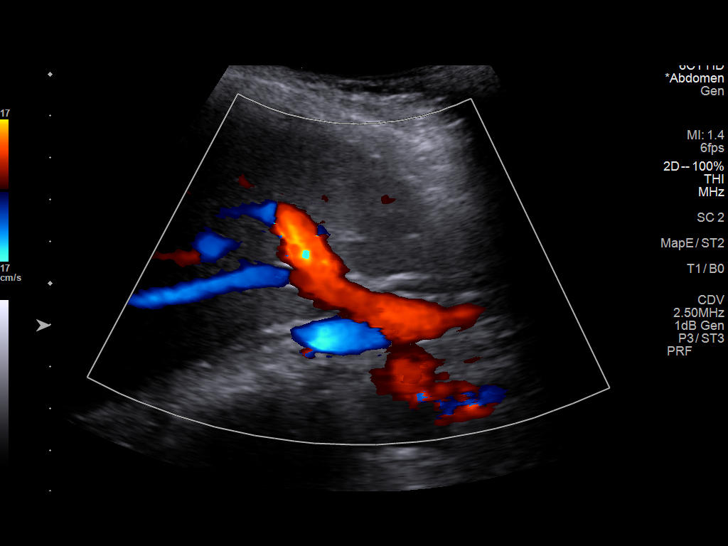
[im 43/73]
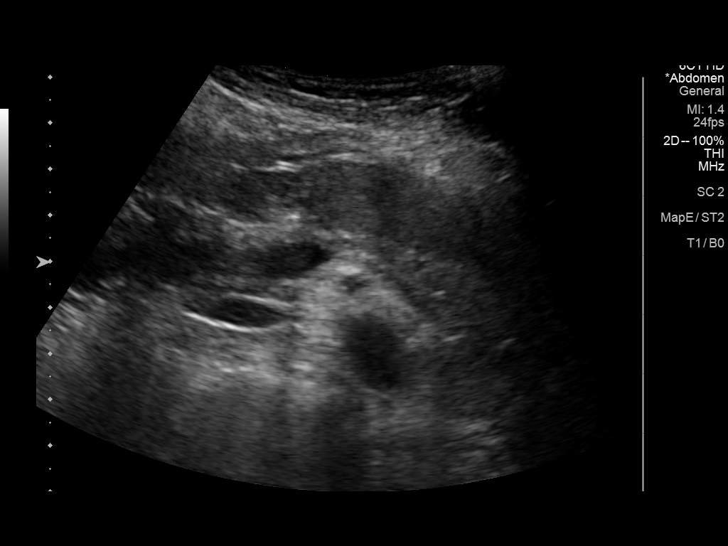
[im 49/73]
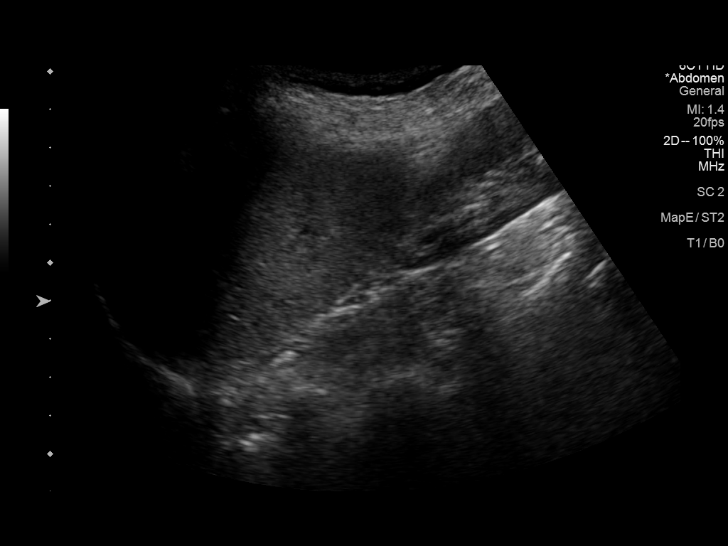
[im 55/73]
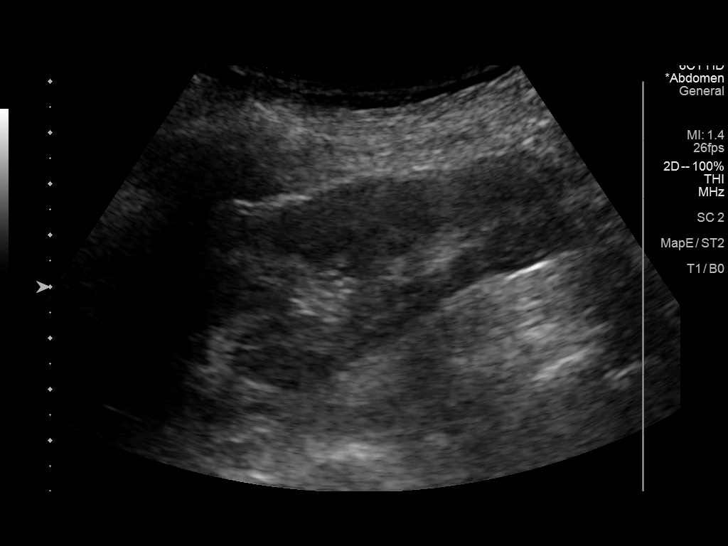
[im 61/73]
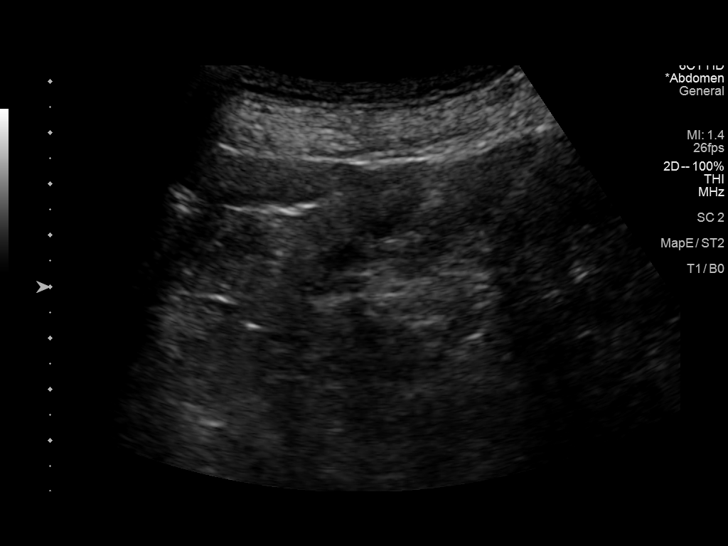
[im 67/73]
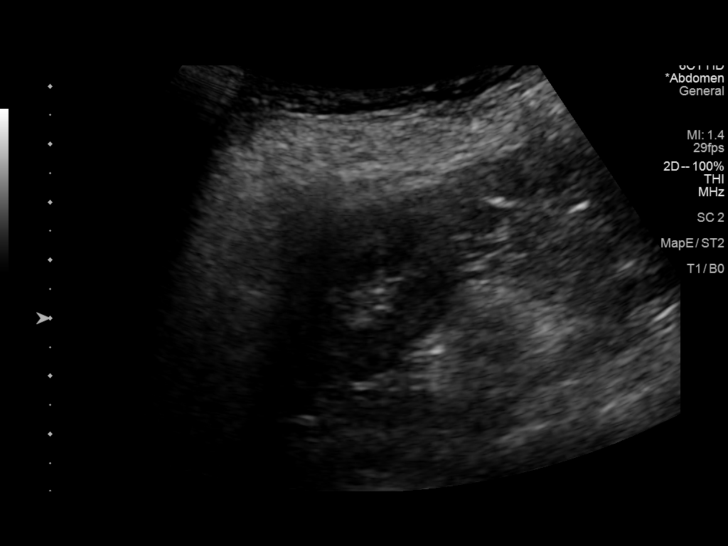
[im 73/73]
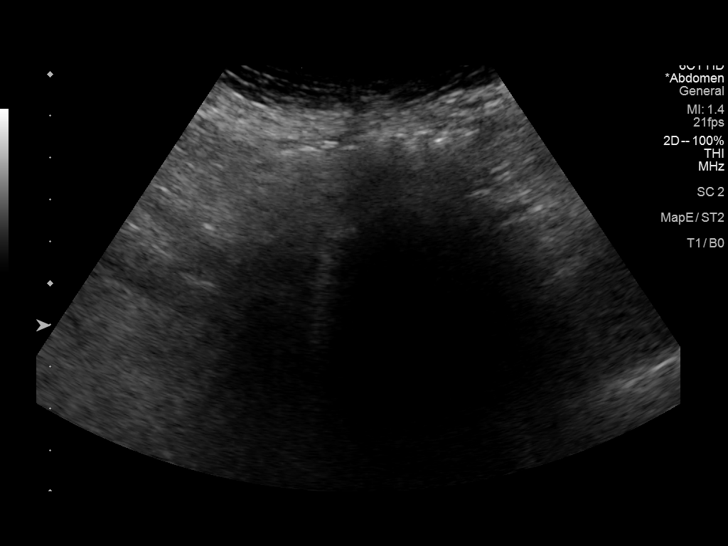

[13 of 25 positions shown; findings below may reference images not displayed]

FINDINGS: Gallbladder: Physiologically distended. No gallstones or wall
thickening visualized. No sonographic Murphy sign noted by
sonographer.

Common bile duct: Diameter: 3 mm proximally, 6-7 mm distally, normal
for age. No visualized choledocholithiasis.

Liver: Heterogeneous hepatic parenchyma without discrete focal
lesion. There is no capsular nodularity. Portal vein is patent on
color Doppler imaging with normal direction of blood flow towards
the liver.

IVC: No abnormality visualized.

Pancreas: Visualized portion unremarkable. No evident pancreatic
mass or ductal dilatation.

Spleen: Size and appearance within normal limits.

Right Kidney: Length: 9.9 cm. Mild increased parenchymal
echogenicity. No hydronephrosis. No visualized stone or focal
lesion.

Left Kidney: Length: 8.8 cm. Mild increased parenchymal
echogenicity. No hydronephrosis. No visualized stone.

Abdominal aorta: No aneurysm visualized.

Other findings: No abdominal ascites.
IMPRESSION: 1. Mild increased renal parenchymal echogenicity suggesting chronic
medical renal disease.
2. Heterogeneous liver parenchyma without discrete focal lesion.
3. No explanation for weight loss.

## 2022-03-24 NOTE — Telephone Encounter (Signed)
Tried to call pt, line just rings out. Called daughter to relay message to mom for her to give [place] a call

## 2022-03-31 ENCOUNTER — Inpatient Hospital Stay: Payer: Medicare HMO | Admitting: Oncology

## 2022-03-31 ENCOUNTER — Encounter: Payer: Self-pay | Admitting: Oncology

## 2022-03-31 VITALS — BP 157/93 | HR 58 | Resp 18 | Ht 61.0 in | Wt 88.0 lb

## 2022-03-31 DIAGNOSIS — F1090 Alcohol use, unspecified, uncomplicated: Secondary | ICD-10-CM

## 2022-03-31 DIAGNOSIS — R768 Other specified abnormal immunological findings in serum: Secondary | ICD-10-CM | POA: Diagnosis not present

## 2022-03-31 DIAGNOSIS — Z8261 Family history of arthritis: Secondary | ICD-10-CM | POA: Diagnosis not present

## 2022-03-31 DIAGNOSIS — D649 Anemia, unspecified: Secondary | ICD-10-CM

## 2022-03-31 DIAGNOSIS — Z8042 Family history of malignant neoplasm of prostate: Secondary | ICD-10-CM | POA: Diagnosis not present

## 2022-03-31 DIAGNOSIS — Z79899 Other long term (current) drug therapy: Secondary | ICD-10-CM | POA: Diagnosis not present

## 2022-03-31 DIAGNOSIS — R69 Illness, unspecified: Secondary | ICD-10-CM | POA: Diagnosis not present

## 2022-03-31 DIAGNOSIS — R7989 Other specified abnormal findings of blood chemistry: Secondary | ICD-10-CM

## 2022-03-31 DIAGNOSIS — K760 Fatty (change of) liver, not elsewhere classified: Secondary | ICD-10-CM | POA: Diagnosis not present

## 2022-03-31 DIAGNOSIS — Z8249 Family history of ischemic heart disease and other diseases of the circulatory system: Secondary | ICD-10-CM | POA: Diagnosis not present

## 2022-03-31 DIAGNOSIS — Z86010 Personal history of colonic polyps: Secondary | ICD-10-CM | POA: Diagnosis not present

## 2022-03-31 DIAGNOSIS — Z8371 Family history of colonic polyps: Secondary | ICD-10-CM | POA: Diagnosis not present

## 2022-03-31 DIAGNOSIS — I129 Hypertensive chronic kidney disease with stage 1 through stage 4 chronic kidney disease, or unspecified chronic kidney disease: Secondary | ICD-10-CM | POA: Diagnosis not present

## 2022-03-31 DIAGNOSIS — R634 Abnormal weight loss: Secondary | ICD-10-CM | POA: Diagnosis not present

## 2022-03-31 NOTE — Assessment & Plan Note (Addendum)
Encourage patient's alcohol cessation

## 2022-03-31 NOTE — Assessment & Plan Note (Signed)
Previous work-up showed normal folate, iron panel showed increased iron saturation of 51, ferritin elevated at 582.  SPEP showed negative M protein, light chain ratio is normal.  Polyclonal increase detected in 1 or more immunoglobulins. Anemia is likely secondary to chronic kidney disease and alcohol induced bone marrow suppression. Currently hemoglobin is above 10.  Continue observation.

## 2022-03-31 NOTE — Progress Notes (Addendum)
Hematology/Oncology Progress note [phone] [phone]            Patient Care Team: Ria Bush, MD as PCP - General (Family Medicine)  ASSESSMENT & PLAN:   Normocytic anemia Previous work-up showed normal folate, iron panel showed increased iron saturation of 51, ferritin elevated at 582.  SPEP showed negative M protein, light chain ratio is normal.  Polyclonal increase detected in 1 or more immunoglobulins. Anemia is likely secondary to chronic kidney disease and alcohol induced bone marrow suppression. Currently hemoglobin is above 10.  Continue observation.  Elevated ferritin negative hepatitis panel, negative hemochromatosis DNA mutation screening.  Likely due to chronic inflammation/alcohol use. 02/22/2021.ultrasound abdomen done on  heterogeneous liver parenchyma without discrete focal lesion.?  Fatty liver disease/chronic alcoholic liver disease.  12/19/21 ultrasound abdomen recently showed No focal lesion identified. Within normal limits in parenchymal echogenicity. Ferritin level has decreased.  Likely secondary to alcohol   Encourage cessation.efforts.  Habitual alcohol use Encourage patient's alcohol cessation   Orders Placed This Encounter  Procedures   CBC with Differential/Platelet    Standing Status:   Future    Standing Expiration Date:   04/01/2023   Comprehensive metabolic panel    Standing Status:   Future    Standing Expiration Date:   03/31/2023   Folate    Standing Status:   Future    Standing Expiration Date:   04/01/2023   Ferritin    Standing Status:   Future    Standing Expiration Date:   04/01/2023   Iron and TIBC    Standing Status:   Future    Standing Expiration Date:   04/01/2023   Vitamin B12    Standing Status:   Future    Standing Expiration Date:   04/01/2023   Follow-up in 4 months All questions were answered. The patient knows to call the clinic with any problems, questions or concerns.  Earlie Server, MD,  PhD [facility] Hematology Oncology 03/31/2022   CHIEF COMPLAINTS/REASON FOR VISIT:  Follow-up for anemia, elevated ferritin  HISTORY OF PRESENTING ILLNESS:   Margaret Nguyen is a  81 y.o.  female with PMH listed below was seen in consultation at the request of  Ria Bush, MD  for evaluation of anemia  08/22/2021 cbc showed wbc 1.9, hemoglobin 9.8, normal platelet. SPEP showed no m protein.  Iron panel showed saturation of 35, ferritin of 554.  Reviewed previous records, patient has a chronic history of elevated ferritin. 09/26/2021 Cbc showed wbc 3.2, hemoglobin 10.2, normal platelet.  B12 400,   Patient drinks alcohol routinely.  She drinks 2 shots of vodka daily.  She reports that ever since she was notified that she is referred to see hematology oncology, she has been drinking slightly more. She feels well otherwise.  She has lost weight since her dental work which caused her to have mild sore.  She also has had chronic night sweating ever since 1970s.  No fever.  INTERVAL HISTORY Margaret Nguyen is a 81 y.o. female who has above history reviewed by me today presents for follow up visit for management of anemia. Elevated ferritin She has cut back on alcohol consumption. Stopped drinking [place], she patient drinks brandy occasionally. She denies any new complaints.  Appetite is fair.  Weight is stable.  Review of Systems  Constitutional:  Negative for appetite change, chills, fatigue, fever and unexpected weight change.  HENT:   Negative for hearing loss and voice change.  Eyes:  Negative for eye problems.  Respiratory:  Negative for chest tightness and cough.   Cardiovascular:  Negative for chest pain.  Gastrointestinal:  Negative for abdominal distention, abdominal pain and blood in stool.  Endocrine: Negative for hot flashes.  Genitourinary:  Negative for difficulty urinating and frequency.   Musculoskeletal:  Negative for arthralgias.  Skin:  Negative for  itching and rash.  Neurological:  Negative for extremity weakness.  Hematological:  Negative for adenopathy.  Psychiatric/Behavioral:  Negative for confusion.     MEDICAL HISTORY:  Past Medical History:  Diagnosis Date   Allergy    Anemia    Arthritis    Blood transfusion without reported diagnosis    Diverticulitis    Family hx colonic polyps    Heart murmur    has Rheumatic fever as a child    Hiatal hernia    Hypertension    Osteoporosis 08/2009   R femur -2.5   Villous adenoma of colon     SURGICAL HISTORY: Past Surgical History:  Procedure Laterality Date   ABDOMINAL HYSTERECTOMY     COLONOSCOPY  04/2010   polyps, diverticulosis, hem, rec rpt 3 yrs [facility])   COLONOSCOPY  04/2014   mult TA, one with high grade dysplasia, mod diverticulosis, rpt 3 yrs [facility] Plan)   COLONOSCOPY  03/2019   1 TA, diverticulosis, no f/u planned [facility] Plan)   DEXA  08/2009   femur -2.5   DEXA  04/2015   T -3 hips, -1.8 spine   LAPAROSCOPIC SIGMOID COLECTOMY  1999?   villous adenoma   PARTIAL HYSTERECTOMY     ovaries remain   TOE SURGERY     TUBAL LIGATION      SOCIAL HISTORY: Social History   Socioeconomic History   Marital status: Widowed    Spouse name: Not on file   Number of children: Not on file   Years of education: Not on file   Highest education level: Not on file  Occupational History   Not on file  Tobacco Use   Smoking status: Never    Passive exposure: Past   Smokeless tobacco: Never  Vaping Use   Vaping Use: Never used  Substance and Sexual Activity   Alcohol use: Yes    Alcohol/week: 2.0 standard drinks of alcohol    Types: 2 Shots of liquor per week    Comment: 2 drinks of vodka daily. / a 5th every 2 weeks   Drug use: Never   Sexual activity: Not Currently  Other Topics Concern   Not on file  Social History Narrative   Married [phone], widowed   1 son, 2 daughters, 1 son deceased   5 grandchildren   Lives alone; I- ADLs   Working-fulltime [employer]         Social Determinants of Health   Financial Resource Strain: Low Risk  (01/30/2022)   Overall Financial Resource Strain (CARDIA)    Difficulty of Paying Living Expenses: Not hard at all  Food Insecurity: No Food Insecurity (01/30/2022)   Hunger Vital Sign    Worried About Running Out of Food in the Last Year: Never true    Ran Out of Food in the Last Year: Never true  Transportation Needs: No Transportation Needs (01/30/2022)   PRAPARE - Hydrologist (Medical): No    Lack of Transportation (Non-Medical): No  Physical Activity: Inactive (01/30/2022)   Exercise Vital Sign    Days of Exercise per Week: 0 days  Minutes of Exercise per Session: 0 min  Stress: No Stress Concern Present (01/30/2022)   [place]    Feeling of Stress : Not at all  Social Connections: Socially Isolated (01/30/2022)   Social Connection and Isolation Panel [NHANES]    Frequency of Communication with Friends and Family: More than three times a week    Frequency of Social Gatherings with Friends and Family: More than three times a week    Attends Religious Services: Never    Marine scientist or Organizations: No    Attends Archivist Meetings: Never    Marital Status: Widowed  Intimate Partner Violence: Not At Risk (01/30/2022)   Humiliation, Afraid, Rape, and Kick questionnaire    Fear of Current or Ex-Partner: No    Emotionally Abused: No    Physically Abused: No    Sexually Abused: No    FAMILY HISTORY: Family History  Problem Relation Age of Onset   Arthritis Mother    Arthritis Father    Heart disease Father    Hypertension Father    Allergic rhinitis Daughter    Allergic rhinitis Son    Prostate cancer Son    Colon cancer Neg Hx    Esophageal cancer Neg Hx    Rectal cancer Neg Hx    Stomach cancer Neg Hx    Colon polyps Neg Hx     ALLERGIES:  is allergic to other  and shellfish allergy.  MEDICATIONS:  Current Outpatient Medications  Medication Sig Dispense Refill   acetaminophen (TYLENOL) 500 MG tablet Take 1 tablet (500 mg total) by mouth every 6 (six) hours as needed. 30 tablet 0   Calcium Carb-Cholecalciferol 500-400 MG-UNIT TABS Take by mouth.     Cholecalciferol (VITAMIN D3) 25 MCG (1000 UT) CAPS Take by mouth.     colchicine 0.6 MG tablet TAKE 1 TABLET (0.6 MG TOTAL) BY MOUTH DAILY AS NEEDED (GOUT FLARE). 90 tablet 1   hydrochlorothiazide (HYDRODIURIL) 25 MG tablet Take 0.5 tablets (12.5 mg total) by mouth daily. 45 tablet 3   losartan (COZAAR) 50 MG tablet TAKE 1 TABLET BY MOUTH EVERY DAY 90 tablet 3   Multiple Vitamins-Minerals (CENTRUM PO) Take by mouth.     potassium chloride (KLOR-CON) 10 MEQ tablet Take 1 tablet (10 mEq total) by mouth daily. 90 tablet 3   vitamin B-12 (CYANOCOBALAMIN) 500 MCG tablet Take 1 tablet (500 mcg total) by mouth 2 (two) times a week.     vitamin C (ASCORBIC ACID) 500 MG tablet Take 500 mg by mouth daily.     No current facility-administered medications for this visit.     PHYSICAL EXAMINATION: ECOG PERFORMANCE STATUS: 1 - Symptomatic but completely ambulatory Vitals:   03/31/22 1504 03/31/22 1505  BP:  (!) 157/93  Pulse:  (!) 58  Resp: 18 18  SpO2:  100%   Filed Weights   03/31/22 1502 03/31/22 1504  Weight: 88 lb (39.9 kg) 88 lb (39.9 kg)    Physical Exam Constitutional:      General: She is not in acute distress.    Comments: Frail thin built elderly female, ambulates independently  HENT:     Head: Normocephalic and atraumatic.  Eyes:     General: No scleral icterus. Cardiovascular:     Rate and Rhythm: Normal rate and regular rhythm.     Heart sounds: Normal heart sounds.  Pulmonary:     Effort: Pulmonary effort is normal. No  respiratory distress.     Breath sounds: No wheezing.  Abdominal:     General: Bowel sounds are normal. There is no distension.     Palpations: Abdomen is soft.   Musculoskeletal:        General: No deformity. Normal range of motion.     Cervical back: Normal range of motion and neck supple.  Skin:    General: Skin is warm and dry.     Findings: No erythema or rash.  Neurological:     Mental Status: She is alert and oriented to person, place, and time. Mental status is at baseline.     Cranial Nerves: No cranial nerve deficit.  Psychiatric:        Mood and Affect: Mood normal.     LABORATORY DATA:  I have reviewed the data as listed    Latest Ref Rng & Units 03/23/2022   10:13 AM 02/17/2022    8:26 AM 12/10/2021    9:48 AM  CBC  WBC 4.0 - 10.5 K/uL 5.0  3.3  4.8   Hemoglobin 12.0 - 15.0 g/dL 10.4  10.9  10.1   Hematocrit 36.0 - 46.0 % 32.8  33.0  30.3   Platelets 150 - 400 K/uL 224  238.0  364.0       Latest Ref Rng & Units 03/23/2022   10:13 AM 02/17/2022    8:26 AM 12/10/2021    9:48 AM  CMP  Glucose 70 - 99 mg/dL 117  162  103   BUN 8 - 23 mg/dL 31  43  44   Creatinine 0.44 - 1.00 mg/dL 1.15  1.24  1.30   Sodium 135 - 145 mmol/L 137  138  135   Potassium 3.5 - 5.1 mmol/L 4.1  4.2  4.1   Chloride 98 - 111 mmol/L 102  98  99   CO2 22 - 32 mmol/L '27  28  26   '$ Calcium 8.9 - 10.3 mg/dL 9.4  10.0  10.2   Total Protein 6.5 - 8.1 g/dL 8.0  8.0  8.2   Total Bilirubin 0.3 - 1.2 mg/dL 0.5  0.8  0.3   Alkaline Phos 38 - 126 U/L 46  47  51   AST 15 - 41 U/L '25  22  17   '$ ALT 0 - 44 U/L '14  14  11      '$ Iron/TIBC/Ferritin/ %Sat    Component Value Date/Time   IRON 102 02/17/2022 0826   TIBC 411.6 02/17/2022 0826   FERRITIN 288 03/23/2022 1013   IRONPCTSAT 24.8 02/17/2022 0826       RADIOGRAPHIC STUDIES: I have personally reviewed the radiological images as listed and agreed with the findings in the report. No results found.

## 2022-03-31 NOTE — Assessment & Plan Note (Addendum)
negative hepatitis panel, negative hemochromatosis DNA mutation screening.  Likely due to chronic inflammation/alcohol use. 02/22/2021.ultrasound abdomen done on  heterogeneous liver parenchyma without discrete focal lesion.?  Fatty liver disease/chronic alcoholic liver disease.  12/19/21 ultrasound abdomen recently showed No focal lesion identified. Within normal limits in parenchymal echogenicity. Ferritin level has decreased.  Likely secondary to alcohol   Encourage cessation.efforts.

## 2022-07-28 ENCOUNTER — Inpatient Hospital Stay: Payer: Medicare Other | Attending: Oncology

## 2022-07-28 DIAGNOSIS — Z86010 Personal history of colonic polyps: Secondary | ICD-10-CM | POA: Insufficient documentation

## 2022-07-28 DIAGNOSIS — Z79899 Other long term (current) drug therapy: Secondary | ICD-10-CM | POA: Insufficient documentation

## 2022-07-28 DIAGNOSIS — Z83719 Family history of colon polyps, unspecified: Secondary | ICD-10-CM | POA: Insufficient documentation

## 2022-07-28 DIAGNOSIS — R634 Abnormal weight loss: Secondary | ICD-10-CM | POA: Insufficient documentation

## 2022-07-28 DIAGNOSIS — N183 Chronic kidney disease, stage 3 unspecified: Secondary | ICD-10-CM | POA: Insufficient documentation

## 2022-07-28 DIAGNOSIS — K760 Fatty (change of) liver, not elsewhere classified: Secondary | ICD-10-CM | POA: Insufficient documentation

## 2022-07-28 DIAGNOSIS — Z90710 Acquired absence of both cervix and uterus: Secondary | ICD-10-CM | POA: Insufficient documentation

## 2022-07-28 DIAGNOSIS — Z8042 Family history of malignant neoplasm of prostate: Secondary | ICD-10-CM | POA: Insufficient documentation

## 2022-07-28 DIAGNOSIS — Z8249 Family history of ischemic heart disease and other diseases of the circulatory system: Secondary | ICD-10-CM | POA: Insufficient documentation

## 2022-07-28 DIAGNOSIS — Z8261 Family history of arthritis: Secondary | ICD-10-CM | POA: Insufficient documentation

## 2022-07-28 DIAGNOSIS — R768 Other specified abnormal immunological findings in serum: Secondary | ICD-10-CM | POA: Insufficient documentation

## 2022-07-28 DIAGNOSIS — R7989 Other specified abnormal findings of blood chemistry: Secondary | ICD-10-CM | POA: Insufficient documentation

## 2022-07-28 DIAGNOSIS — I129 Hypertensive chronic kidney disease with stage 1 through stage 4 chronic kidney disease, or unspecified chronic kidney disease: Secondary | ICD-10-CM | POA: Insufficient documentation

## 2022-07-28 DIAGNOSIS — D649 Anemia, unspecified: Secondary | ICD-10-CM | POA: Insufficient documentation

## 2022-07-29 ENCOUNTER — Telehealth: Payer: Self-pay | Admitting: *Deleted

## 2022-07-29 NOTE — Telephone Encounter (Signed)
Patient called to cancel her appointment today she asked to be called to reschedule.

## 2022-07-31 ENCOUNTER — Inpatient Hospital Stay: Payer: Medicare Other | Admitting: Oncology

## 2022-08-03 ENCOUNTER — Inpatient Hospital Stay: Payer: Medicare Other | Admitting: Oncology

## 2022-08-03 ENCOUNTER — Inpatient Hospital Stay: Payer: Medicare Other

## 2022-08-03 ENCOUNTER — Encounter: Payer: Self-pay | Admitting: Oncology

## 2022-08-03 VITALS — BP 148/71 | HR 72 | Temp 95.5°F | Resp 18 | Wt 93.2 lb

## 2022-08-03 DIAGNOSIS — Z90710 Acquired absence of both cervix and uterus: Secondary | ICD-10-CM | POA: Diagnosis not present

## 2022-08-03 DIAGNOSIS — D649 Anemia, unspecified: Secondary | ICD-10-CM

## 2022-08-03 DIAGNOSIS — R7989 Other specified abnormal findings of blood chemistry: Secondary | ICD-10-CM | POA: Diagnosis not present

## 2022-08-03 DIAGNOSIS — I129 Hypertensive chronic kidney disease with stage 1 through stage 4 chronic kidney disease, or unspecified chronic kidney disease: Secondary | ICD-10-CM | POA: Diagnosis not present

## 2022-08-03 DIAGNOSIS — F1090 Alcohol use, unspecified, uncomplicated: Secondary | ICD-10-CM | POA: Diagnosis not present

## 2022-08-03 DIAGNOSIS — K760 Fatty (change of) liver, not elsewhere classified: Secondary | ICD-10-CM | POA: Diagnosis not present

## 2022-08-03 DIAGNOSIS — R768 Other specified abnormal immunological findings in serum: Secondary | ICD-10-CM | POA: Diagnosis not present

## 2022-08-03 DIAGNOSIS — Z8249 Family history of ischemic heart disease and other diseases of the circulatory system: Secondary | ICD-10-CM | POA: Diagnosis not present

## 2022-08-03 DIAGNOSIS — Z79899 Other long term (current) drug therapy: Secondary | ICD-10-CM | POA: Diagnosis not present

## 2022-08-03 DIAGNOSIS — Z8042 Family history of malignant neoplasm of prostate: Secondary | ICD-10-CM | POA: Diagnosis not present

## 2022-08-03 DIAGNOSIS — N183 Chronic kidney disease, stage 3 unspecified: Secondary | ICD-10-CM | POA: Diagnosis not present

## 2022-08-03 DIAGNOSIS — R634 Abnormal weight loss: Secondary | ICD-10-CM | POA: Diagnosis not present

## 2022-08-03 DIAGNOSIS — Z83719 Family history of colon polyps, unspecified: Secondary | ICD-10-CM | POA: Diagnosis not present

## 2022-08-03 DIAGNOSIS — Z86010 Personal history of colonic polyps: Secondary | ICD-10-CM | POA: Diagnosis not present

## 2022-08-03 DIAGNOSIS — Z8261 Family history of arthritis: Secondary | ICD-10-CM | POA: Diagnosis not present

## 2022-08-03 LAB — IRON AND TIBC
Iron: 80 ug/dL (ref 28–170)
Saturation Ratios: 23 % (ref 10.4–31.8)
TIBC: 356 ug/dL (ref 250–450)
UIBC: 276 ug/dL

## 2022-08-03 LAB — CBC WITH DIFFERENTIAL/PLATELET
Abs Immature Granulocytes: 0.01 10*3/uL (ref 0.00–0.07)
Basophils Absolute: 0 10*3/uL (ref 0.0–0.1)
Basophils Relative: 0 %
Eosinophils Absolute: 0.1 10*3/uL (ref 0.0–0.5)
Eosinophils Relative: 2 %
HCT: 32.1 % — ABNORMAL LOW (ref 36.0–46.0)
Hemoglobin: 10.9 g/dL — ABNORMAL LOW (ref 12.0–15.0)
Immature Granulocytes: 0 %
Lymphocytes Relative: 18 %
Lymphs Abs: 0.8 10*3/uL (ref 0.7–4.0)
MCH: 29.3 pg (ref 26.0–34.0)
MCHC: 34 g/dL (ref 30.0–36.0)
MCV: 86.3 fL (ref 80.0–100.0)
Monocytes Absolute: 0.5 10*3/uL (ref 0.1–1.0)
Monocytes Relative: 11 %
Neutro Abs: 3.1 10*3/uL (ref 1.7–7.7)
Neutrophils Relative %: 69 %
Platelets: 338 10*3/uL (ref 150–400)
RBC: 3.72 MIL/uL — ABNORMAL LOW (ref 3.87–5.11)
RDW: 13.4 % (ref 11.5–15.5)
WBC: 4.5 10*3/uL (ref 4.0–10.5)
nRBC: 0 % (ref 0.0–0.2)

## 2022-08-03 LAB — COMPREHENSIVE METABOLIC PANEL
ALT: 11 U/L (ref 0–44)
AST: 23 U/L (ref 15–41)
Albumin: 4.3 g/dL (ref 3.5–5.0)
Alkaline Phosphatase: 48 U/L (ref 38–126)
Anion gap: 11 (ref 5–15)
BUN: 27 mg/dL — ABNORMAL HIGH (ref 8–23)
CO2: 26 mmol/L (ref 22–32)
Calcium: 9.6 mg/dL (ref 8.9–10.3)
Chloride: 102 mmol/L (ref 98–111)
Creatinine, Ser: 1.23 mg/dL — ABNORMAL HIGH (ref 0.44–1.00)
GFR, Estimated: 44 mL/min — ABNORMAL LOW (ref >60)
Glucose, Bld: 134 mg/dL — ABNORMAL HIGH (ref 70–99)
Potassium: 4.1 mmol/L (ref 3.5–5.1)
Sodium: 139 mmol/L (ref 135–145)
Total Bilirubin: 0.4 mg/dL (ref 0.3–1.2)
Total Protein: 8.3 g/dL — ABNORMAL HIGH (ref 6.5–8.1)

## 2022-08-03 LAB — FOLATE: Folate: 35 ng/mL (ref >5.9)

## 2022-08-03 LAB — VITAMIN B12: Vitamin B-12: 1047 pg/mL — ABNORMAL HIGH (ref 180–914)

## 2022-08-03 LAB — FERRITIN: Ferritin: 310 ng/mL — ABNORMAL HIGH (ref 11–307)

## 2022-08-03 NOTE — Assessment & Plan Note (Addendum)
Previous work-up showed normal folate, iron panel showed increased iron saturation of 51, ferritin elevated at 582.  SPEP showed negative M protein, light chain ratio is normal.  Polyclonal increase detected in 1 or more immunoglobulins. Anemia is likely secondary to chronic kidney disease and alcohol induced bone marrow suppression. Currently hemoglobin is stable, above 10.  Continue observation.

## 2022-08-03 NOTE — Assessment & Plan Note (Addendum)
Encourage patient's alcohol cessation effort  

## 2022-08-03 NOTE — Progress Notes (Signed)
Hematology/Oncology Progress note [phone] [phone]            Patient Care Team: Ria Bush, MD as PCP - General (Family Medicine)  ASSESSMENT & PLAN:   Normocytic anemia Previous work-up showed normal folate, iron panel showed increased iron saturation of 51, ferritin elevated at 582.  SPEP showed negative M protein, light chain ratio is normal.  Polyclonal increase detected in 1 or more immunoglobulins. Anemia is likely secondary to chronic kidney disease and alcohol induced bone marrow suppression. Currently hemoglobin is stable, above 10.  Continue observation.  Elevated ferritin negative hepatitis panel, negative hemochromatosis DNA mutation screening.  Likely due to chronic inflammation/alcohol use. 02/22/2021.ultrasound abdomen done on  heterogeneous liver parenchyma without discrete focal lesion.?  Fatty liver disease/chronic alcoholic liver disease.  12/19/21 ultrasound abdomen recently showed no focal lesion identified. Within normal limits in parenchymal echogenicity.  Ferritin level is pending, continue monitor   Likely secondary to alcohol use. Encourage cessation efforts.  Habitual alcohol use Encourage patient's alcohol cessation effort  Orders Placed This Encounter  Procedures   CBC with Differential/Platelet    Standing Status:   Future    Standing Expiration Date:   08/04/2023   Comprehensive metabolic panel    Standing Status:   Future    Standing Expiration Date:   08/03/2023   Iron and TIBC    Standing Status:   Future    Standing Expiration Date:   08/04/2023   Ferritin    Standing Status:   Future    Standing Expiration Date:   08/04/2023   Follow-up in 4 months All questions were answered. The patient knows to call the clinic with any problems, questions or concerns.  Earlie Server, MD, PhD [facility] Hematology Oncology 08/03/2022   CHIEF COMPLAINTS/REASON FOR VISIT:  Follow-up for anemia, elevated  ferritin  HISTORY OF PRESENTING ILLNESS:   Margaret Nguyen is a  82 y.o.  female with PMH listed below was seen in consultation at the request of  Ria Bush, MD  for evaluation of anemia  08/22/2021 cbc showed wbc 1.9, hemoglobin 9.8, normal platelet. SPEP showed no m protein.  Iron panel showed saturation of 35, ferritin of 554.  Reviewed previous records, patient has a chronic history of elevated ferritin. 09/26/2021 Cbc showed wbc 3.2, hemoglobin 10.2, normal platelet.  B12 400,   Patient drinks alcohol routinely.  She drinks 2 shots of vodka daily.  She reports that ever since she was notified that she is referred to see hematology oncology, she has been drinking slightly more. She feels well otherwise.  She has lost weight since her dental work which caused her to have mild sore.  She also has had chronic night sweating ever since 1970s.  No fever.  INTERVAL HISTORY Margaret Nguyen is a 83 y.o. female who has above history reviewed by me today presents for follow up visit for management of anemia. Elevated ferritin She has cut back on alcohol consumption. Stopped drinking [place], she patient drinks brandy occasionally. She denies any new complaints.  Appetite is fair.  Weight is stable.  Review of Systems  Constitutional:  Negative for appetite change, chills, fatigue, fever and unexpected weight change.  HENT:   Negative for hearing loss and voice change.   Eyes:  Negative for eye problems.  Respiratory:  Negative for chest tightness and cough.   Cardiovascular:  Negative for chest pain.  Gastrointestinal:  Negative for abdominal distention, abdominal pain and blood in  stool.  Endocrine: Negative for hot flashes.  Genitourinary:  Negative for difficulty urinating and frequency.   Musculoskeletal:  Negative for arthralgias.  Skin:  Negative for itching and rash.  Neurological:  Negative for extremity weakness.  Hematological:  Negative for adenopathy.   Psychiatric/Behavioral:  Negative for confusion.     MEDICAL HISTORY:  Past Medical History:  Diagnosis Date   Allergy    Anemia    Arthritis    Blood transfusion without reported diagnosis    Diverticulitis    Family hx colonic polyps    Heart murmur    has Rheumatic fever as a child    Hiatal hernia    Hypertension    Osteoporosis 08/2009   R femur -2.5   Villous adenoma of colon     SURGICAL HISTORY: Past Surgical History:  Procedure Laterality Date   ABDOMINAL HYSTERECTOMY     COLONOSCOPY  04/2010   polyps, diverticulosis, hem, rec rpt 3 yrs [facility] Plan)   COLONOSCOPY  04/2014   mult TA, one with high grade dysplasia, mod diverticulosis, rpt 3 yrs [facility] Plan)   COLONOSCOPY  03/2019   1 TA, diverticulosis, no f/u planned [facility] Plan)   DEXA  08/2009   femur -2.5   DEXA  04/2015   T -3 hips, -1.8 spine   LAPAROSCOPIC SIGMOID COLECTOMY  1999?   villous adenoma   PARTIAL HYSTERECTOMY     ovaries remain   TOE SURGERY     TUBAL LIGATION      SOCIAL HISTORY: Social History   Socioeconomic History   Marital status: Widowed    Spouse name: Not on file   Number of children: Not on file   Years of education: Not on file   Highest education level: Not on file  Occupational History   Not on file  Tobacco Use   Smoking status: Never    Passive exposure: Past   Smokeless tobacco: Never  Vaping Use   Vaping Use: Never used  Substance and Sexual Activity   Alcohol use: Yes    Alcohol/week: 2.0 standard drinks of alcohol    Types: 2 Shots of liquor per week    Comment: 2 drinks of vodka daily. / a 5th every 2 weeks   Drug use: Never   Sexual activity: Not Currently  Other Topics Concern   Not on file  Social History Narrative   Married [phone], widowed   1 son, 2 daughters, 1 son deceased   5 grandchildren   Lives alone; I- ADLs   Working-fulltime [employer]         Social Determinants of Health   Financial Resource Strain: Low Risk  (01/30/2022)   Overall  Financial Resource Strain (CARDIA)    Difficulty of Paying Living Expenses: Not hard at all  Food Insecurity: No Food Insecurity (01/30/2022)   Hunger Vital Sign    Worried About Running Out of Food in the Last Year: Never true    Ran Out of Food in the Last Year: Never true  Transportation Needs: No Transportation Needs (01/30/2022)   PRAPARE - Hydrologist (Medical): No    Lack of Transportation (Non-Medical): No  Physical Activity: Inactive (01/30/2022)   Exercise Vital Sign    Days of Exercise per Week: 0 days    Minutes of Exercise per Session: 0 min  Stress: No Stress Concern Present (01/30/2022)   [place]    Feeling of Stress :  Not at all  Social Connections: Socially Isolated (01/30/2022)   Social Connection and Isolation Panel [NHANES]    Frequency of Communication with Friends and Family: More than three times a week    Frequency of Social Gatherings with Friends and Family: More than three times a week    Attends Religious Services: Never    Marine scientist or Organizations: No    Attends Archivist Meetings: Never    Marital Status: Widowed  Intimate Partner Violence: Not At Risk (01/30/2022)   Humiliation, Afraid, Rape, and Kick questionnaire    Fear of Current or Ex-Partner: No    Emotionally Abused: No    Physically Abused: No    Sexually Abused: No    FAMILY HISTORY: Family History  Problem Relation Age of Onset   Arthritis Mother    Arthritis Father    Heart disease Father    Hypertension Father    Allergic rhinitis Daughter    Allergic rhinitis Son    Prostate cancer Son    Colon cancer Neg Hx    Esophageal cancer Neg Hx    Rectal cancer Neg Hx    Stomach cancer Neg Hx    Colon polyps Neg Hx     ALLERGIES:  is allergic to other and shellfish allergy.  MEDICATIONS:  Current Outpatient Medications  Medication Sig Dispense Refill    acetaminophen (TYLENOL) 500 MG tablet Take 1 tablet (500 mg total) by mouth every 6 (six) hours as needed. 30 tablet 0   Calcium Carb-Cholecalciferol 500-400 MG-UNIT TABS Take by mouth.     Cholecalciferol (VITAMIN D3) 25 MCG (1000 UT) CAPS Take by mouth.     hydrochlorothiazide (HYDRODIURIL) 25 MG tablet Take 0.5 tablets (12.5 mg total) by mouth daily. 45 tablet 3   losartan (COZAAR) 50 MG tablet TAKE 1 TABLET BY MOUTH EVERY DAY 90 tablet 3   Multiple Vitamins-Minerals (CENTRUM PO) Take by mouth.     potassium chloride (KLOR-CON) 10 MEQ tablet Take 1 tablet (10 mEq total) by mouth daily. 90 tablet 3   vitamin B-12 (CYANOCOBALAMIN) 500 MCG tablet Take 1 tablet (500 mcg total) by mouth 2 (two) times a week.     vitamin C (ASCORBIC ACID) 500 MG tablet Take 500 mg by mouth daily.     colchicine 0.6 MG tablet TAKE 1 TABLET (0.6 MG TOTAL) BY MOUTH DAILY AS NEEDED (GOUT FLARE). (Patient not taking: Reported on 08/03/2022) 90 tablet 1   No current facility-administered medications for this visit.     PHYSICAL EXAMINATION: ECOG PERFORMANCE STATUS: 1 - Symptomatic but completely ambulatory Vitals:   08/03/22 0914  BP: (!) 148/71  Pulse: 72  Resp: 18  Temp: (!) 95.5 F (35.3 C)   Filed Weights   08/03/22 0914  Weight: 93 lb 3.2 oz (42.3 kg)    Physical Exam Constitutional:      General: She is not in acute distress.    Comments: Frail thin built elderly female, ambulates independently  HENT:     Head: Normocephalic and atraumatic.  Eyes:     General: No scleral icterus. Cardiovascular:     Rate and Rhythm: Normal rate and regular rhythm.     Heart sounds: Normal heart sounds.  Pulmonary:     Effort: Pulmonary effort is normal. No respiratory distress.     Breath sounds: No wheezing.  Abdominal:     General: Bowel sounds are normal. There is no distension.     Palpations: Abdomen is soft.  Musculoskeletal:        General: No deformity. Normal range of motion.     Cervical back:  Normal range of motion and neck supple.  Skin:    General: Skin is warm and dry.     Findings: No erythema or rash.  Neurological:     Mental Status: She is alert and oriented to person, place, and time. Mental status is at baseline.     Cranial Nerves: No cranial nerve deficit.  Psychiatric:        Mood and Affect: Mood normal.     LABORATORY DATA:  I have reviewed the data as listed    Latest Ref Rng & Units 08/03/2022    8:59 AM 03/23/2022   10:13 AM 02/17/2022    8:26 AM  CBC  WBC 4.0 - 10.5 K/uL 4.5  5.0  3.3   Hemoglobin 12.0 - 15.0 g/dL 10.9  10.4  10.9   Hematocrit 36.0 - 46.0 % 32.1  32.8  33.0   Platelets 150 - 400 K/uL 338  224  238.0       Latest Ref Rng & Units 08/03/2022    8:59 AM 03/23/2022   10:13 AM 02/17/2022    8:26 AM  CMP  Glucose 70 - 99 mg/dL 134  117  162   BUN 8 - 23 mg/dL 27  31  43   Creatinine 0.44 - 1.00 mg/dL 1.23  1.15  1.24   Sodium 135 - 145 mmol/L 139  137  138   Potassium 3.5 - 5.1 mmol/L 4.1  4.1  4.2   Chloride 98 - 111 mmol/L 102  102  98   CO2 22 - 32 mmol/L '26  27  28   '$ Calcium 8.9 - 10.3 mg/dL 9.6  9.4  10.0   Total Protein 6.5 - 8.1 g/dL 8.3  8.0  8.0   Total Bilirubin 0.3 - 1.2 mg/dL 0.4  0.5  0.8   Alkaline Phos 38 - 126 U/L 48  46  47   AST 15 - 41 U/L '23  25  22   '$ ALT 0 - 44 U/L '11  14  14      '$ Iron/TIBC/Ferritin/ %Sat    Component Value Date/Time   IRON 102 02/17/2022 0826   TIBC 411.6 02/17/2022 0826   FERRITIN 288 03/23/2022 1013   IRONPCTSAT 24.8 02/17/2022 0826       RADIOGRAPHIC STUDIES: I have personally reviewed the radiological images as listed and agreed with the findings in the report. No results found.

## 2022-08-03 NOTE — Assessment & Plan Note (Addendum)
negative hepatitis panel, negative hemochromatosis DNA mutation screening.  Likely due to chronic inflammation/alcohol use. 02/22/2021.ultrasound abdomen done on  heterogeneous liver parenchyma without discrete focal lesion.?  Fatty liver disease/chronic alcoholic liver disease.  12/19/21 ultrasound abdomen recently showed no focal lesion identified. Within normal limits in parenchymal echogenicity.  Ferritin level is pending, continue monitor   Likely secondary to alcohol use. Encourage cessation efforts.

## 2022-08-18 ENCOUNTER — Encounter: Payer: Self-pay | Admitting: Pharmacist

## 2022-08-28 ENCOUNTER — Encounter: Payer: Self-pay | Admitting: Family Medicine

## 2022-08-28 ENCOUNTER — Ambulatory Visit (INDEPENDENT_AMBULATORY_CARE_PROVIDER_SITE_OTHER): Payer: Medicare Other | Admitting: Family Medicine

## 2022-08-28 VITALS — BP 138/66 | HR 62 | Temp 97.3°F | Ht 61.0 in | Wt 91.0 lb

## 2022-08-28 DIAGNOSIS — G6289 Other specified polyneuropathies: Secondary | ICD-10-CM

## 2022-08-28 DIAGNOSIS — D649 Anemia, unspecified: Secondary | ICD-10-CM | POA: Diagnosis not present

## 2022-08-28 DIAGNOSIS — I10 Essential (primary) hypertension: Secondary | ICD-10-CM

## 2022-08-28 DIAGNOSIS — K760 Fatty (change of) liver, not elsewhere classified: Secondary | ICD-10-CM | POA: Diagnosis not present

## 2022-08-28 DIAGNOSIS — N1832 Chronic kidney disease, stage 3b: Secondary | ICD-10-CM | POA: Diagnosis not present

## 2022-08-28 DIAGNOSIS — G629 Polyneuropathy, unspecified: Secondary | ICD-10-CM

## 2022-08-28 DIAGNOSIS — R636 Underweight: Secondary | ICD-10-CM

## 2022-08-28 DIAGNOSIS — M810 Age-related osteoporosis without current pathological fracture: Secondary | ICD-10-CM

## 2022-08-28 DIAGNOSIS — F1090 Alcohol use, unspecified, uncomplicated: Secondary | ICD-10-CM | POA: Diagnosis not present

## 2022-08-28 HISTORY — DX: Polyneuropathy, unspecified: G62.9

## 2022-08-28 MED ORDER — HYDROCHLOROTHIAZIDE 12.5 MG PO TABS: 12.5000 mg | ORAL_TABLET | Freq: Every day | ORAL | 1 refills | Status: DC

## 2022-08-28 NOTE — Assessment & Plan Note (Signed)
Latest DEXA improved - will continue cal/vit D replacement, off medication.

## 2022-08-28 NOTE — Assessment & Plan Note (Addendum)
Continue to encourage alcohol cessation which likely contributes to current symptoms.

## 2022-08-28 NOTE — Progress Notes (Signed)
Patient ID: Margaret Nguyen, female    DOB: 03-Jun-1941, 82 y.o.   MRN: [id number]  This visit was conducted in person.  BP 138/66   Pulse 62   Temp (!) 97.3 F (36.3 C) (Temporal)   Ht 5' 1"$  (1.549 m)   Wt 91 lb (41.3 kg)   SpO2 94%   BMI 17.19 kg/m    CC: 6 mo f/u visit  Subjective:   HPI: Margaret Nguyen is a 82 y.o. female presenting on 08/28/2022 for Medical Management of Chronic Issues (Here for 6 mo f/u.)   She notes she's not been receiving phone calls but rather daughter is - she wants her primary # used [phone].   Anemia with leukopenia and elevated ferritin - followed by Dr Janese Banks hematology. Thought multifactorial (alcohol, CKD). Seeing Q4 months. She's cut down on alcohol.   She endorses years of cramps to legs as well as numbness and tingling to the toes. No burning pain. Endorses h/o gout. No numbness to hands or elsewhere on body, no weakness noted.   Fatty liver changes - saw GI, AMA returned positive. Planned f/u PRN.   Shellfish allergy - saw allergist, has epi-pen.   Low weight - saw nutritionist 08/2020, weight ranging 85-100 lbs. Trouble with denture fit may have contributed. She decided to stop remeron. She notes chewing is doing better.   Osteoporosis - unable to tolerate oral bisphosphonate. Prolia was unaffordable. To consider IV bisphosphonate, consider rpt DEXA later this year.  DEXA 02/2021 - T score +2.4 spine, R femur neck -1.6 improved bone density consider rpt 2 yrs.       Relevant past medical, surgical, family and social history reviewed and updated as indicated. Interim medical history since our last visit reviewed. Allergies and medications reviewed and updated. Outpatient Medications Prior to Visit  Medication Sig Dispense Refill   acetaminophen (TYLENOL) 500 MG tablet Take 1 tablet (500 mg total) by mouth every 6 (six) hours as needed. 30 tablet 0   Calcium Carb-Cholecalciferol 500-400 MG-UNIT TABS Take by mouth.      Cholecalciferol (VITAMIN D3) 25 MCG (1000 UT) CAPS Take by mouth.     colchicine 0.6 MG tablet TAKE 1 TABLET (0.6 MG TOTAL) BY MOUTH DAILY AS NEEDED (GOUT FLARE). 90 tablet 1   losartan (COZAAR) 50 MG tablet TAKE 1 TABLET BY MOUTH EVERY DAY 90 tablet 3   Multiple Vitamins-Minerals (CENTRUM PO) Take by mouth.     potassium chloride (KLOR-CON) 10 MEQ tablet Take 1 tablet (10 mEq total) by mouth daily. 90 tablet 3   vitamin B-12 (CYANOCOBALAMIN) 500 MCG tablet Take 1 tablet (500 mcg total) by mouth 2 (two) times a week.     vitamin C (ASCORBIC ACID) 500 MG tablet Take 500 mg by mouth daily.     hydrochlorothiazide (HYDRODIURIL) 25 MG tablet Take 0.5 tablets (12.5 mg total) by mouth daily. 45 tablet 3   No facility-administered medications prior to visit.     Per HPI unless specifically indicated in ROS section below Review of Systems  Objective:  BP 138/66   Pulse 62   Temp (!) 97.3 F (36.3 C) (Temporal)   Ht 5' 1"$  (1.549 m)   Wt 91 lb (41.3 kg)   SpO2 94%   BMI 17.19 kg/m   Wt Readings from Last 3 Encounters:  08/28/22 91 lb (41.3 kg)  08/03/22 93 lb 3.2 oz (42.3 kg)  03/31/22 88 lb (39.9 kg)  Physical Exam Vitals and nursing note reviewed.  Constitutional:      Appearance: Normal appearance. She is not ill-appearing.     Comments: Ambulates with cane  Cardiovascular:     Rate and Rhythm: Normal rate and regular rhythm.     Pulses: Normal pulses.     Heart sounds: Normal heart sounds. No murmur heard. Pulmonary:     Effort: Pulmonary effort is normal. No respiratory distress.     Breath sounds: Normal breath sounds. No wheezing, rhonchi or rales.  Musculoskeletal:     Right lower leg: No edema.     Left lower leg: No edema.  Skin:    General: Skin is warm and dry.     Findings: No rash.  Neurological:     Mental Status: She is alert.  Psychiatric:        Mood and Affect: Mood normal.        Behavior: Behavior normal.       Results for orders placed or  performed in visit on 08/03/22  Vitamin B12  Result Value Ref Range   Vitamin B-12 1,047 (H) 180 - 914 pg/mL  Iron and TIBC  Result Value Ref Range   Iron 80 28 - 170 ug/dL   TIBC 356 250 - 450 ug/dL   Saturation Ratios 23 10.4 - 31.8 %   UIBC 276 ug/dL  Ferritin  Result Value Ref Range   Ferritin 310 (H) 11 - 307 ng/mL  Folate  Result Value Ref Range   Folate 35.0 >5.9 ng/mL  Comprehensive metabolic panel  Result Value Ref Range   Sodium 139 135 - 145 mmol/L   Potassium 4.1 3.5 - 5.1 mmol/L   Chloride 102 98 - 111 mmol/L   CO2 26 22 - 32 mmol/L   Glucose, Bld 134 (H) 70 - 99 mg/dL   BUN 27 (H) 8 - 23 mg/dL   Creatinine, Ser 1.23 (H) 0.44 - 1.00 mg/dL   Calcium 9.6 8.9 - 10.3 mg/dL   Total Protein 8.3 (H) 6.5 - 8.1 g/dL   Albumin 4.3 3.5 - 5.0 g/dL   AST 23 15 - 41 U/L   ALT 11 0 - 44 U/L   Alkaline Phosphatase 48 38 - 126 U/L   Total Bilirubin 0.4 0.3 - 1.2 mg/dL   GFR, Estimated 44 (L) >60 mL/min   Anion gap 11 5 - 15  CBC with Differential/Platelet  Result Value Ref Range   WBC 4.5 4.0 - 10.5 K/uL   RBC 3.72 (L) 3.87 - 5.11 MIL/uL   Hemoglobin 10.9 (L) 12.0 - 15.0 g/dL   HCT 32.1 (L) 36.0 - 46.0 %   MCV 86.3 80.0 - 100.0 fL   MCH 29.3 26.0 - 34.0 pg   MCHC 34.0 30.0 - 36.0 g/dL   RDW 13.4 11.5 - 15.5 %   Platelets 338 150 - 400 K/uL   nRBC 0.0 0.0 - 0.2 %   Neutrophils Relative % 69 %   Neutro Abs 3.1 1.7 - 7.7 K/uL   Lymphocytes Relative 18 %   Lymphs Abs 0.8 0.7 - 4.0 K/uL   Monocytes Relative 11 %   Monocytes Absolute 0.5 0.1 - 1.0 K/uL   Eosinophils Relative 2 %   Eosinophils Absolute 0.1 0.0 - 0.5 K/uL   Basophils Relative 0 %   Basophils Absolute 0.0 0.0 - 0.1 K/uL   Immature Granulocytes 0 %   Abs Immature Granulocytes 0.01 0.00 - 0.07 K/uL   Assessment & Plan:  Problem List Items Addressed This Visit     Normocytic anemia (Chronic)    Appreciate heme care.  Anticipate related to h/o alcohol use and CKD.       Habitual alcohol use  (Chronic)    Continue to encourage alcohol cessation which likely contributes to current symptoms.       Essential hypertension    Chronic, stable. Continue current regimen.       Relevant Medications   hydrochlorothiazide (HYDRODIURIL) 12.5 MG tablet   Osteoporosis    Latest DEXA improved - will continue cal/vit D replacement, off medication.       CKD (chronic kidney disease) stage 3, GFR 30-59 ml/min (HCC)    Latest GFR 44.       Fatty liver    Saw GI, planned f/u PRN.       Underweight    Weight stable.       Peripheral neuropathy - Primary    Describes symptoms of bilateral symmetrical length dependent sensorineuropathy -likely alcohol related. B12 and b1 levels normal on last check. Will continue to monitor. She continues using cane. No recent falls.         Meds ordered this encounter  Medications   hydrochlorothiazide (HYDRODIURIL) 12.5 MG tablet    Sig: Take 1 tablet (12.5 mg total) by mouth daily.    Dispense:  90 tablet    Refill:  1    Note new dose    No orders of the defined types were placed in this encounter.   Patient Instructions  You are doing well today Continue current medicines and vitamins. Return in 6 months for physical/wellness visit (after 02/26/2023).   Follow up plan: Return in about 6 months (around 02/26/2023) for annual exam, prior fasting for blood work, medicare wellness visit.  Ria Bush, MD

## 2022-08-28 NOTE — Assessment & Plan Note (Signed)
Chronic, stable. Continue current regimen. 

## 2022-08-28 NOTE — Assessment & Plan Note (Signed)
Latest GFR 44.

## 2022-08-28 NOTE — Assessment & Plan Note (Addendum)
Appreciate heme care.  Anticipate related to h/o alcohol use and CKD.

## 2022-08-28 NOTE — Assessment & Plan Note (Signed)
Describes symptoms of bilateral symmetrical length dependent sensorineuropathy -likely alcohol related. B12 and b1 levels normal on last check. Will continue to monitor. She continues using cane. No recent falls.

## 2022-08-28 NOTE — Assessment & Plan Note (Signed)
Saw GI, planned f/u PRN.

## 2022-08-28 NOTE — Assessment & Plan Note (Signed)
Weight stable.

## 2022-08-28 NOTE — Patient Instructions (Addendum)
You are doing well today Continue current medicines and vitamins. Return in 6 months for physical/wellness visit (after 02/26/2023).

## 2022-11-13 ENCOUNTER — Other Ambulatory Visit: Payer: Self-pay | Admitting: Family Medicine

## 2022-11-13 DIAGNOSIS — M79671 Pain in right foot: Secondary | ICD-10-CM

## 2022-11-13 NOTE — Telephone Encounter (Signed)
Colchicine Last filled:  03/24/21, #90 Last OV:  08/28/22, polyneuropathy Next OV:  02/26/23, CPE

## 2022-11-13 NOTE — Telephone Encounter (Signed)
Refill left on vm at pharmacy.  

## 2022-11-13 NOTE — Telephone Encounter (Signed)
Please phone in due to E prescribing error.  

## 2022-11-13 NOTE — Telephone Encounter (Signed)
ERx 

## 2022-11-17 NOTE — Telephone Encounter (Signed)
Refill left on vm at pharmacy.  

## 2023-01-19 IMAGING — US US ABDOMEN LIMITED
1 series · 15 of 25 positions shown · non-contrast
Comparison: Abdominal ultrasound 02/21/2021

CLINICAL DATA: Elevated LFTs

EXAM:
ULTRASOUND ABDOMEN LIMITED RIGHT UPPER QUADRANT

[Series 1: us abdomen limited ruq mc & wl · 15 of 44 slices shown]
[im 1/44]
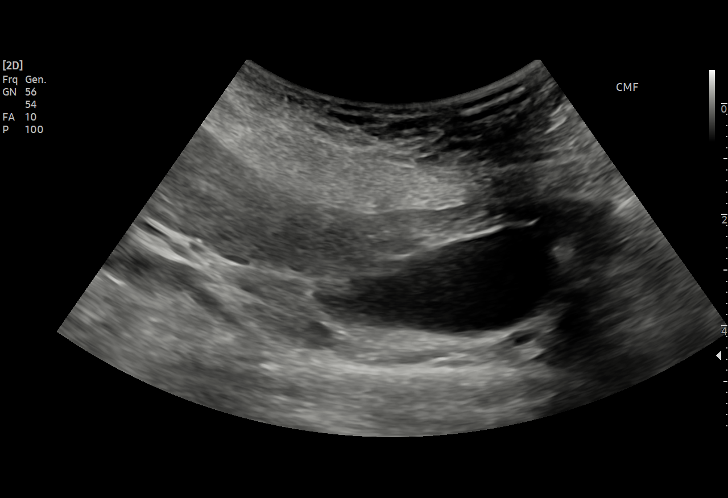
[im 4/44]
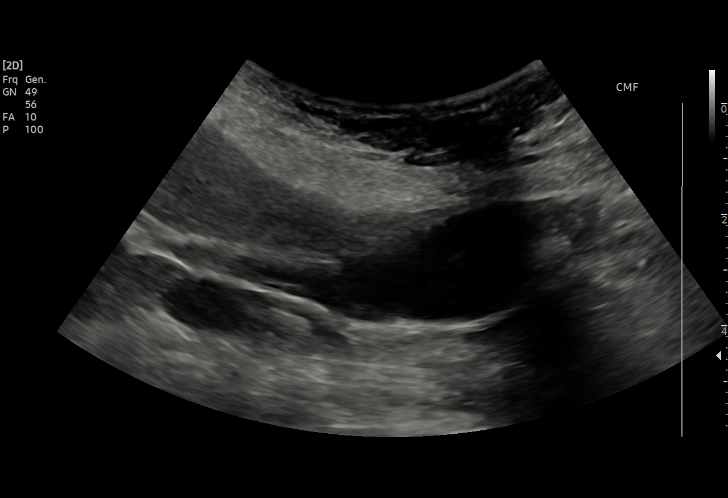
[im 8/44]
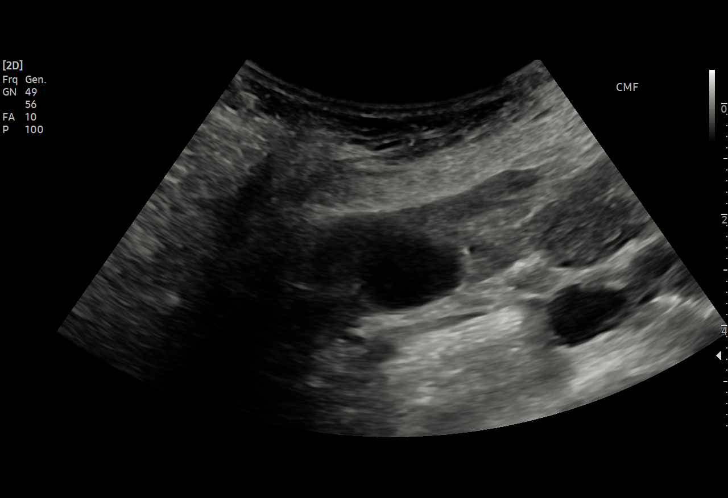
[im 9/44]
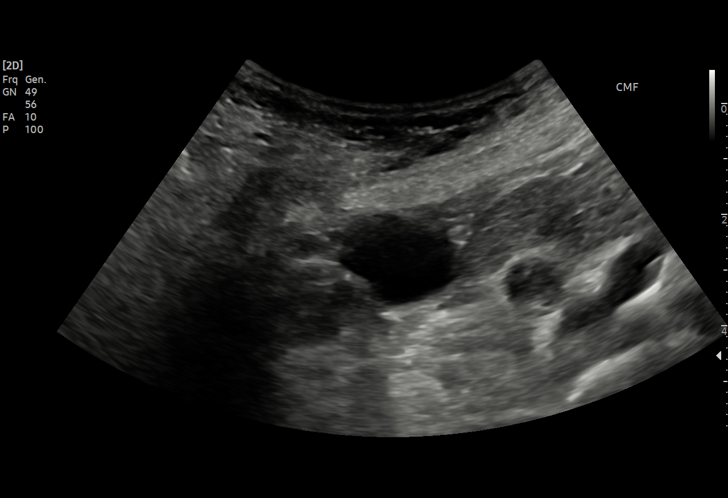
[im 13/44]
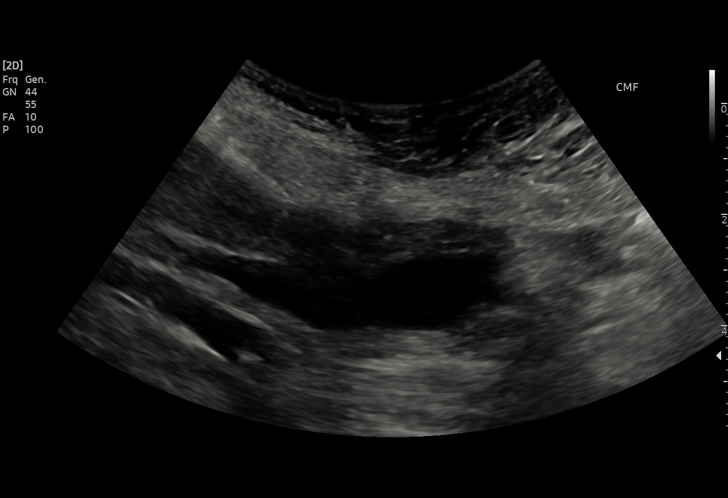
[im 17/44]
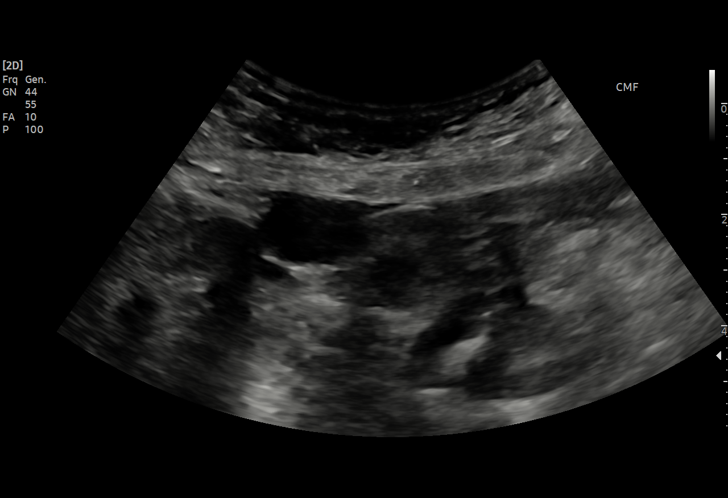
[im 18/44]
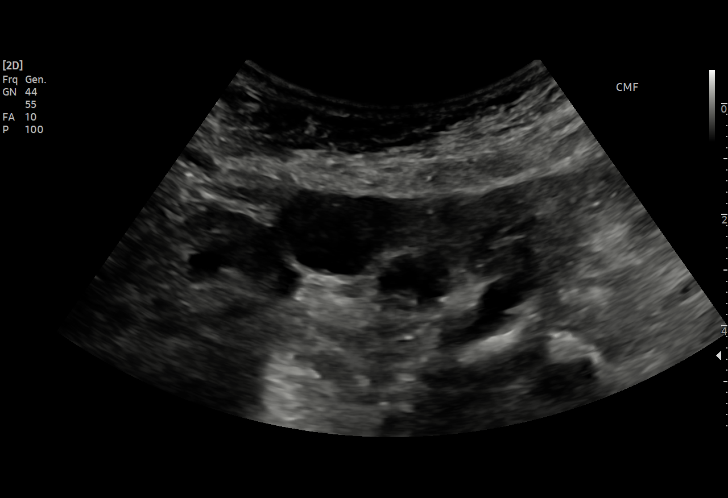
[im 22/44]
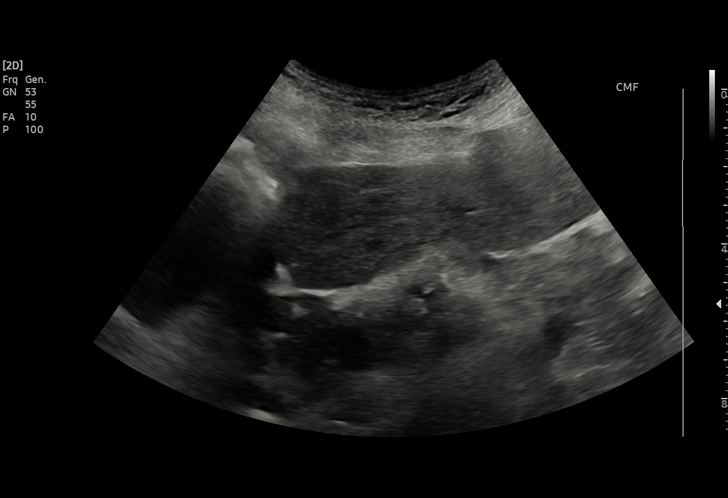
[im 26/44]
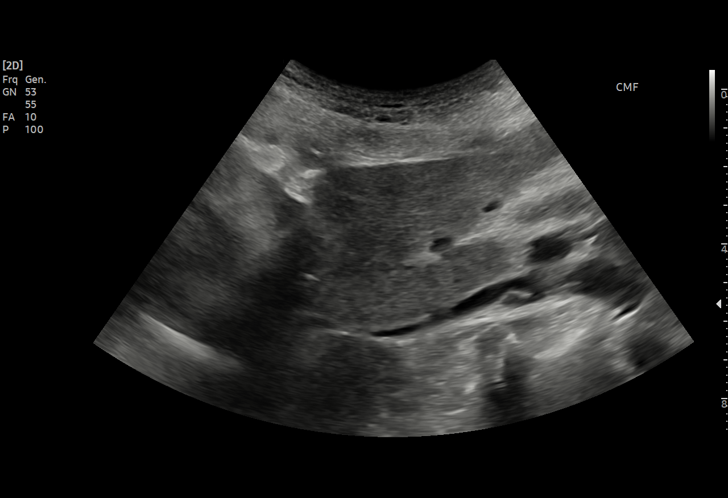
[im 27/44]
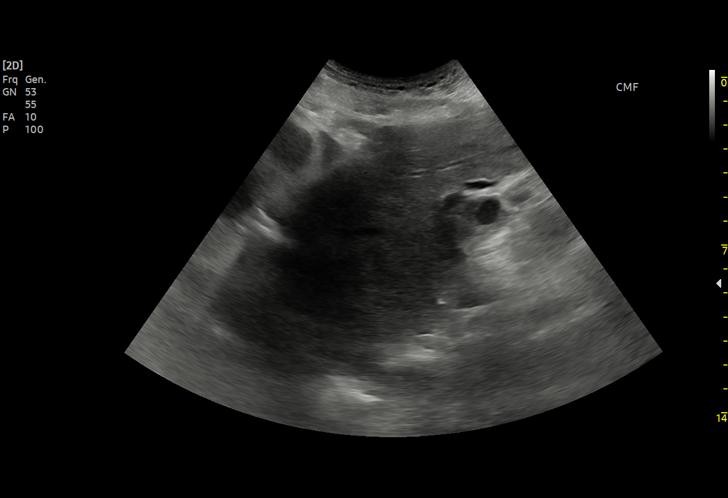
[im 31/44]
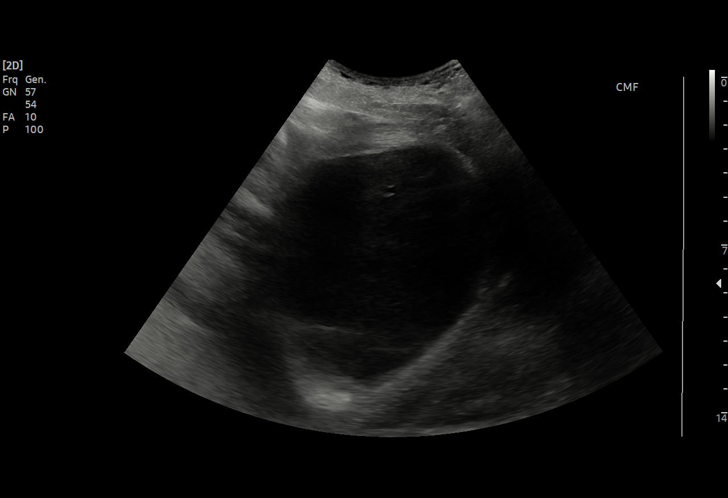
[im 35/44]
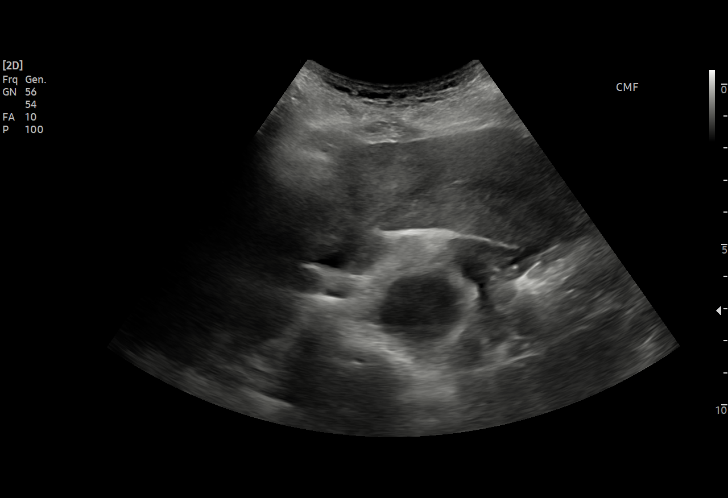
[im 36/44]
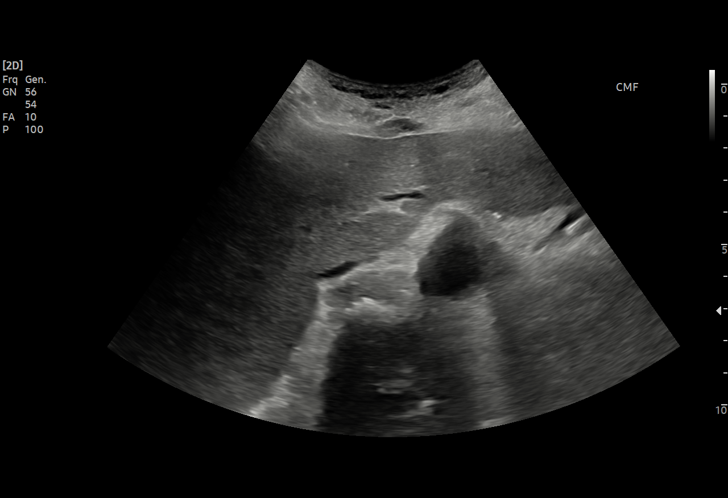
[im 40/44]
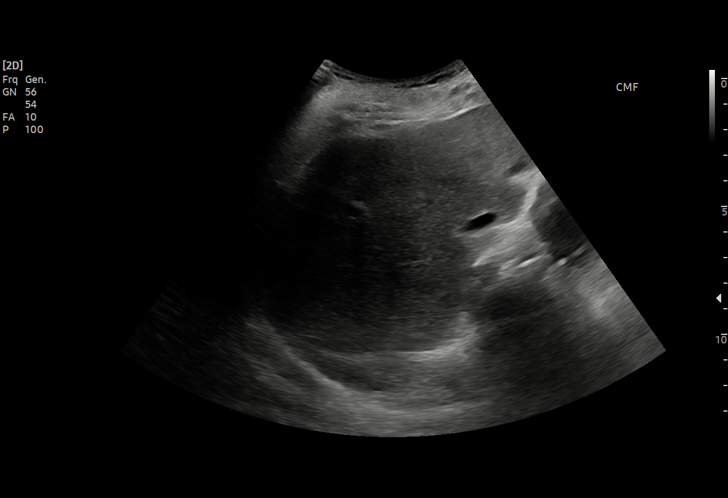
[im 44/44]
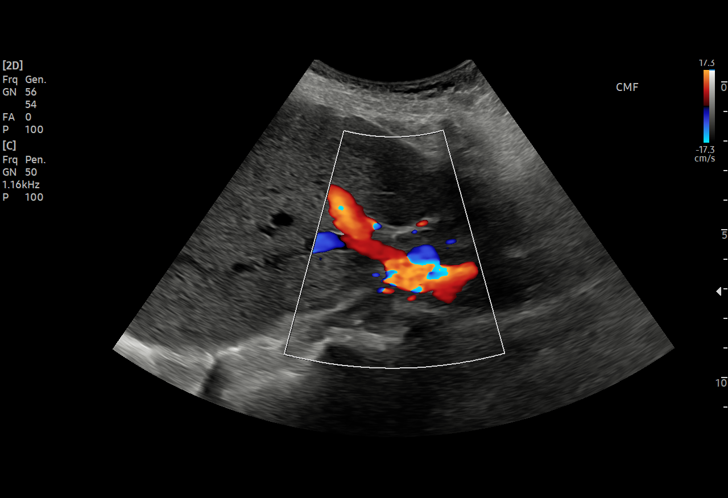

[15 of 25 positions shown; findings below may reference images not displayed]

FINDINGS: Gallbladder:

No gallstones or wall thickening visualized. No sonographic Murphy
sign noted by sonographer.

Common bile duct:

Diameter: 5 mm

Liver:

No focal lesion identified. Within normal limits in parenchymal
echogenicity. Portal vein is patent on color Doppler imaging with
normal direction of blood flow towards the liver.

Other: Limited exam due to body habitus.
IMPRESSION: No acute process identified.

## 2023-02-01 ENCOUNTER — Encounter: Payer: Self-pay | Admitting: Oncology

## 2023-02-01 ENCOUNTER — Inpatient Hospital Stay: Payer: Medicare Other | Attending: Oncology

## 2023-02-01 ENCOUNTER — Inpatient Hospital Stay: Payer: Medicare Other | Admitting: Oncology

## 2023-02-01 VITALS — BP 140/71 | HR 80 | Temp 98.0°F | Resp 18 | Wt 89.8 lb

## 2023-02-01 DIAGNOSIS — Z8261 Family history of arthritis: Secondary | ICD-10-CM | POA: Diagnosis not present

## 2023-02-01 DIAGNOSIS — F1090 Alcohol use, unspecified, uncomplicated: Secondary | ICD-10-CM | POA: Diagnosis not present

## 2023-02-01 DIAGNOSIS — Z86010 Personal history of colonic polyps: Secondary | ICD-10-CM | POA: Insufficient documentation

## 2023-02-01 DIAGNOSIS — Z90710 Acquired absence of both cervix and uterus: Secondary | ICD-10-CM | POA: Diagnosis not present

## 2023-02-01 DIAGNOSIS — R634 Abnormal weight loss: Secondary | ICD-10-CM | POA: Insufficient documentation

## 2023-02-01 DIAGNOSIS — Z83719 Family history of colon polyps, unspecified: Secondary | ICD-10-CM | POA: Insufficient documentation

## 2023-02-01 DIAGNOSIS — Z8042 Family history of malignant neoplasm of prostate: Secondary | ICD-10-CM | POA: Insufficient documentation

## 2023-02-01 DIAGNOSIS — R7989 Other specified abnormal findings of blood chemistry: Secondary | ICD-10-CM | POA: Insufficient documentation

## 2023-02-01 DIAGNOSIS — D631 Anemia in chronic kidney disease: Secondary | ICD-10-CM

## 2023-02-01 DIAGNOSIS — N1832 Chronic kidney disease, stage 3b: Secondary | ICD-10-CM

## 2023-02-01 DIAGNOSIS — I129 Hypertensive chronic kidney disease with stage 1 through stage 4 chronic kidney disease, or unspecified chronic kidney disease: Secondary | ICD-10-CM | POA: Diagnosis not present

## 2023-02-01 DIAGNOSIS — D649 Anemia, unspecified: Secondary | ICD-10-CM

## 2023-02-01 DIAGNOSIS — Z8249 Family history of ischemic heart disease and other diseases of the circulatory system: Secondary | ICD-10-CM | POA: Insufficient documentation

## 2023-02-01 DIAGNOSIS — Z79899 Other long term (current) drug therapy: Secondary | ICD-10-CM | POA: Diagnosis not present

## 2023-02-01 DIAGNOSIS — Z7289 Other problems related to lifestyle: Secondary | ICD-10-CM | POA: Insufficient documentation

## 2023-02-01 LAB — COMPREHENSIVE METABOLIC PANEL
ALT: 14 U/L (ref 0–44)
AST: 28 U/L (ref 15–41)
Albumin: 4.1 g/dL (ref 3.5–5.0)
Alkaline Phosphatase: 49 U/L (ref 38–126)
Anion gap: 11 (ref 5–15)
BUN: 20 mg/dL (ref 8–23)
CO2: 25 mmol/L (ref 22–32)
Calcium: 9.2 mg/dL (ref 8.9–10.3)
Chloride: 101 mmol/L (ref 98–111)
Creatinine, Ser: 1.2 mg/dL — ABNORMAL HIGH (ref 0.44–1.00)
GFR, Estimated: 45 mL/min — ABNORMAL LOW (ref >60)
Glucose, Bld: 143 mg/dL — ABNORMAL HIGH (ref 70–99)
Potassium: 3.8 mmol/L (ref 3.5–5.1)
Sodium: 137 mmol/L (ref 135–145)
Total Bilirubin: 0.3 mg/dL (ref 0.3–1.2)
Total Protein: 7.4 g/dL (ref 6.5–8.1)

## 2023-02-01 LAB — CBC WITH DIFFERENTIAL/PLATELET
Abs Immature Granulocytes: 0.01 10*3/uL (ref 0.00–0.07)
Basophils Absolute: 0 10*3/uL (ref 0.0–0.1)
Basophils Relative: 0 %
Eosinophils Absolute: 0 10*3/uL (ref 0.0–0.5)
Eosinophils Relative: 1 %
HCT: 30.9 % — ABNORMAL LOW (ref 36.0–46.0)
Hemoglobin: 9.7 g/dL — ABNORMAL LOW (ref 12.0–15.0)
Immature Granulocytes: 0 %
Lymphocytes Relative: 19 %
Lymphs Abs: 0.8 10*3/uL (ref 0.7–4.0)
MCH: 29.5 pg (ref 26.0–34.0)
MCHC: 31.4 g/dL (ref 30.0–36.0)
MCV: 93.9 fL (ref 80.0–100.0)
Monocytes Absolute: 0.6 10*3/uL (ref 0.1–1.0)
Monocytes Relative: 14 %
Neutro Abs: 2.7 10*3/uL (ref 1.7–7.7)
Neutrophils Relative %: 66 %
Platelets: 218 10*3/uL (ref 150–400)
RBC: 3.29 MIL/uL — ABNORMAL LOW (ref 3.87–5.11)
RDW: 13.8 % (ref 11.5–15.5)
WBC: 4 10*3/uL (ref 4.0–10.5)
nRBC: 0 % (ref 0.0–0.2)

## 2023-02-01 LAB — IRON AND TIBC
Iron: 59 ug/dL (ref 28–170)
Saturation Ratios: 19 % (ref 10.4–31.8)
TIBC: 308 ug/dL (ref 250–450)
UIBC: 249 ug/dL

## 2023-02-01 LAB — FERRITIN: Ferritin: 355 ng/mL — ABNORMAL HIGH (ref 11–307)

## 2023-02-01 NOTE — Assessment & Plan Note (Addendum)
Anemia is likely secondary to chronic kidney disease and alcohol induced bone marrow suppression. Lab Results  Component Value Date   HGB 9.7 (L) 02/01/2023   TIBC 356 08/03/2022   IRONPCTSAT 23 08/03/2022   FERRITIN 310 (H) 08/03/2022    Hemoglobin has decreased,less than 10 Discussed recommendation of EPO replacement therapy, rationale and possible side effects were also discussed.  She prefers to be observed, and retested in 3 months.

## 2023-02-01 NOTE — Assessment & Plan Note (Signed)
Encourage patient's alcohol cessation effort  

## 2023-02-01 NOTE — Progress Notes (Signed)
Hematology/Oncology Progress note [phone] [phone]            Patient Care Team: [name], MD as PCP - General (Family Medicine)  ASSESSMENT & PLAN:   Anemia in chronic kidney disease (CKD) Anemia is likely secondary to chronic kidney disease and alcohol induced bone marrow suppression. Lab Results  Component Value Date   HGB 9.7 (L) 02/01/2023   TIBC 356 08/03/2022   IRONPCTSAT 23 08/03/2022   FERRITIN 310 (H) 08/03/2022    Hemoglobin has decreased,less than 10 Discussed recommendation of EPO replacement therapy, rationale and possible side effects were also discussed.  She prefers to be observed, and retested in 3 months.   Elevated ferritin negative hepatitis panel, negative hemochromatosis DNA mutation screening.  Likely due to chronic inflammation/alcohol use. 02/22/2021.ultrasound abdomen done on  heterogeneous liver parenchyma without discrete focal lesion.?  Fatty liver disease/chronic alcoholic liver disease.  12/19/21 ultrasound abdomen recently showed no focal lesion identified. Within normal limits in parenchymal echogenicity.  Ferritin level is chronically elevated, continue monitor   Likely secondary to alcohol use. Encourage cessation efforts.  Habitual alcohol use Encourage patient's alcohol cessation effort  No orders of the defined types were placed in this encounter.  Follow-up in 4 months All questions were answered. The patient knows to call the clinic with any problems, questions or concerns.  [name], MD, PhD [facility] Hematology Oncology 02/01/2023   CHIEF COMPLAINTS/REASON FOR VISIT:  Follow-up for anemia, elevated ferritin  HISTORY OF PRESENTING ILLNESS:   Margaret Nguyen is a  82 y.o.  female with PMH listed below was seen in consultation at the request of  [name], MD  for evaluation of anemia  08/22/2021 cbc showed wbc 1.9, hemoglobin 9.8, normal platelet. SPEP showed no m protein.   Iron panel showed saturation of 35, ferritin of 554.  Reviewed previous records, patient has a chronic history of elevated ferritin. 09/26/2021 Cbc showed wbc 3.2, hemoglobin 10.2, normal platelet.  B12 400,   Patient drinks alcohol routinely.  She drinks 2 shots of vodka daily.  She reports that ever since she was notified that she is referred to see hematology oncology, she has been drinking slightly more. She feels well otherwise.  She has lost weight since her dental work which caused her to have mild sore.  She also has had chronic night sweating ever since 1970s.  No fever.  INTERVAL HISTORY Margaret Nguyen is a 82 y.o. female who has above history reviewed by me today presents for follow up visit for management of anemia. Elevated ferritin She has cut back on alcohol consumption. Stopped drinking [place], patient drinks brandi twice a week.  She denies any new complaints.  Appetite is fair.  Weight is stable.  Review of Systems  Constitutional:  Negative for appetite change, chills, fatigue, fever and unexpected weight change.  HENT:   Negative for hearing loss and voice change.   Eyes:  Negative for eye problems.  Respiratory:  Negative for chest tightness and cough.   Cardiovascular:  Negative for chest pain.  Gastrointestinal:  Negative for abdominal distention, abdominal pain and blood in stool.  Endocrine: Negative for hot flashes.  Genitourinary:  Negative for difficulty urinating and frequency.   Musculoskeletal:  Negative for arthralgias.  Skin:  Negative for itching and rash.  Neurological:  Negative for extremity weakness.  Hematological:  Negative for adenopathy.  Psychiatric/Behavioral:  Negative for confusion.     MEDICAL HISTORY:  Past Medical  History:  Diagnosis Date   Allergy    Anemia    Arthritis    Blood transfusion without reported diagnosis    Diverticulitis    Family hx colonic polyps    Heart murmur    has Rheumatic fever as a child    Hiatal  hernia    Hypertension    Osteoporosis 08/2009   R femur -2.5   Villous adenoma of colon     SURGICAL HISTORY: Past Surgical History:  Procedure Laterality Date   ABDOMINAL HYSTERECTOMY     COLONOSCOPY  04/2010   polyps, diverticulosis, hem, rec rpt 3 yrs Russella Dar)   COLONOSCOPY  04/2014   mult TA, one with high grade dysplasia, mod diverticulosis, rpt 3 yrs Russella Dar)   COLONOSCOPY  03/2019   1 TA, diverticulosis, no f/u planned Russella Dar)   DEXA  08/2009   femur -2.5   DEXA  04/2015   T -3 hips, -1.8 spine   LAPAROSCOPIC SIGMOID COLECTOMY  1999?   villous adenoma   PARTIAL HYSTERECTOMY     ovaries remain   TOE SURGERY     TUBAL LIGATION      SOCIAL HISTORY: Social History   Socioeconomic History   Marital status: Widowed    Spouse name: Not on file   Number of children: Not on file   Years of education: Not on file   Highest education level: Not on file  Occupational History   Not on file  Tobacco Use   Smoking status: Never    Passive exposure: Past   Smokeless tobacco: Never  Vaping Use   Vaping status: Never Used  Substance and Sexual Activity   Alcohol use: Yes    Alcohol/week: 2.0 standard drinks of alcohol    Types: 2 Shots of liquor per week    Comment: 2 drinks of vodka daily. / a 5th every 2 weeks   Drug use: Never   Sexual activity: Not Currently  Other Topics Concern   Not on file  Social History Narrative   Married [phone], widowed   1 son, 2 daughters, 1 son deceased   5 grandchildren   Lives alone; I- ADLs   Working-fulltime [employer]         Social Determinants of Health   Financial Resource Strain: Low Risk  (01/30/2022)   Overall Financial Resource Strain (CARDIA)    Difficulty of Paying Living Expenses: Not hard at all  Food Insecurity: No Food Insecurity (01/30/2022)   Hunger Vital Sign    Worried About Running Out of Food in the Last Year: Never true    Ran Out of Food in the Last Year: Never true  Transportation Needs: No  Transportation Needs (01/30/2022)   PRAPARE - Administrator, Civil Service (Medical): No    Lack of Transportation (Non-Medical): No  Physical Activity: Inactive (01/30/2022)   Exercise Vital Sign    Days of Exercise per Week: 0 days    Minutes of Exercise per Session: 0 min  Stress: No Stress Concern Present (01/30/2022)   [employer] of Occupational Health - Occupational Stress Questionnaire    Feeling of Stress : Not at all  Social Connections: Socially Isolated (01/30/2022)   Social Connection and Isolation Panel [NHANES]    Frequency of Communication with Friends and Family: More than three times a week    Frequency of Social Gatherings with Friends and Family: More than three times a week    Attends Religious Services: Never  Active Member of Clubs or Organizations: No    Attends Banker Meetings: Never    Marital Status: Widowed  Intimate Partner Violence: Not At Risk (01/30/2022)   Humiliation, Afraid, Rape, and Kick questionnaire    Fear of Current or Ex-Partner: No    Emotionally Abused: No    Physically Abused: No    Sexually Abused: No    FAMILY HISTORY: Family History  Problem Relation Age of Onset   Arthritis Mother    Arthritis Father    Heart disease Father    Hypertension Father    Allergic rhinitis Daughter    Allergic rhinitis Son    Prostate cancer Son    Colon cancer Neg Hx    Esophageal cancer Neg Hx    Rectal cancer Neg Hx    Stomach cancer Neg Hx    Colon polyps Neg Hx     ALLERGIES:  is allergic to other and shellfish allergy.  MEDICATIONS:  Current Outpatient Medications  Medication Sig Dispense Refill   acetaminophen (TYLENOL) 500 MG tablet Take 1 tablet (500 mg total) by mouth every 6 (six) hours as needed. 30 tablet 0   Calcium Carb-Cholecalciferol 500-400 MG-UNIT TABS Take by mouth.     Cholecalciferol (VITAMIN D3) 25 MCG (1000 UT) CAPS Take by mouth.     colchicine 0.6 MG tablet TAKE 1 TABLET (0.6 MG TOTAL)  BY MOUTH DAILY AS NEEDED (GOUT FLARE). 30 tablet 1   hydrochlorothiazide (HYDRODIURIL) 12.5 MG tablet Take 1 tablet (12.5 mg total) by mouth daily. 90 tablet 1   losartan (COZAAR) 50 MG tablet TAKE 1 TABLET BY MOUTH EVERY DAY 90 tablet 3   Multiple Vitamins-Minerals (CENTRUM PO) Take by mouth.     potassium chloride (KLOR-CON) 10 MEQ tablet Take 1 tablet (10 mEq total) by mouth daily. 90 tablet 3   vitamin B-12 (CYANOCOBALAMIN) 500 MCG tablet Take 1 tablet (500 mcg total) by mouth 2 (two) times a week.     vitamin C (ASCORBIC ACID) 500 MG tablet Take 500 mg by mouth daily.     No current facility-administered medications for this visit.     PHYSICAL EXAMINATION: ECOG PERFORMANCE STATUS: 1 - Symptomatic but completely ambulatory Vitals:   02/01/23 1308  BP: (!) 140/71  Pulse: 80  Resp: 18  Temp: 98 F (36.7 C)  SpO2: 100%   Filed Weights   02/01/23 1308  Weight: 89 lb 12.8 oz (40.7 kg)    Physical Exam Constitutional:      General: She is not in acute distress.    Comments: Frail thin built elderly female, ambulates independently  HENT:     Head: Normocephalic and atraumatic.  Eyes:     General: No scleral icterus. Cardiovascular:     Rate and Rhythm: Normal rate and regular rhythm.     Heart sounds: Normal heart sounds.  Pulmonary:     Effort: Pulmonary effort is normal. No respiratory distress.     Breath sounds: No wheezing.  Abdominal:     General: Bowel sounds are normal. There is no distension.     Palpations: Abdomen is soft.  Musculoskeletal:        General: No deformity. Normal range of motion.     Cervical back: Normal range of motion and neck supple.  Skin:    General: Skin is warm and dry.     Findings: No erythema or rash.  Neurological:     Mental Status: She is alert and oriented to person,  place, and time. Mental status is at baseline.     Cranial Nerves: No cranial nerve deficit.  Psychiatric:        Mood and Affect: Mood normal.      LABORATORY DATA:  I have reviewed the data as listed    Latest Ref Rng & Units 02/01/2023   12:46 PM 08/03/2022    8:59 AM 03/23/2022   10:13 AM  CBC  WBC 4.0 - 10.5 K/uL 4.0  4.5  5.0   Hemoglobin 12.0 - 15.0 g/dL 9.7  16.1  09.6   Hematocrit 36.0 - 46.0 % 30.9  32.1  32.8   Platelets 150 - 400 K/uL 218  338  224       Latest Ref Rng & Units 08/03/2022    8:59 AM 03/23/2022   10:13 AM 02/17/2022    8:26 AM  CMP  Glucose 70 - 99 mg/dL 045  409  811   BUN 8 - 23 mg/dL 27  31  43   Creatinine 0.44 - 1.00 mg/dL 9.14  7.82  9.56   Sodium 135 - 145 mmol/L 139  137  138   Potassium 3.5 - 5.1 mmol/L 4.1  4.1  4.2   Chloride 98 - 111 mmol/L 102  102  98   CO2 22 - 32 mmol/L 26  27  28    Calcium 8.9 - 10.3 mg/dL 9.6  9.4  21.3   Total Protein 6.5 - 8.1 g/dL 8.3  8.0  8.0   Total Bilirubin 0.3 - 1.2 mg/dL 0.4  0.5  0.8   Alkaline Phos 38 - 126 U/L 48  46  47   AST 15 - 41 U/L 23  25  22    ALT 0 - 44 U/L 11  14  14       Iron/TIBC/Ferritin/ %Sat    Component Value Date/Time   IRON 80 08/03/2022 0859   TIBC 356 08/03/2022 0859   FERRITIN 310 (H) 08/03/2022 0859   IRONPCTSAT 23 08/03/2022 0859      RADIOGRAPHIC STUDIES: I have personally reviewed the radiological images as listed and agreed with the findings in the report. No results found.

## 2023-02-01 NOTE — Assessment & Plan Note (Addendum)
negative hepatitis panel, negative hemochromatosis DNA mutation screening.  Likely due to chronic inflammation/alcohol use. 02/22/2021.ultrasound abdomen done on  heterogeneous liver parenchyma without discrete focal lesion.?  Fatty liver disease/chronic alcoholic liver disease.  12/19/21 ultrasound abdomen recently showed no focal lesion identified. Within normal limits in parenchymal echogenicity.  Ferritin level is chronically elevated, continue monitor   Likely secondary to alcohol use. Encourage cessation efforts.

## 2023-02-02 ENCOUNTER — Ambulatory Visit (INDEPENDENT_AMBULATORY_CARE_PROVIDER_SITE_OTHER): Payer: Medicare Other

## 2023-02-02 VITALS — Ht 62.0 in | Wt 89.0 lb

## 2023-02-02 DIAGNOSIS — Z0000 Encounter for general adult medical examination without abnormal findings: Secondary | ICD-10-CM
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
## 2023-02-02 NOTE — Patient Instructions (Signed)
Margaret Nguyen , Thank you for taking time to come for your Medicare Wellness Visit. I appreciate your ongoing commitment to your health goals. Please review the following plan we discussed and let me know if I can assist you in the future.   These are the goals we discussed:  Goals       No current falls (pt-stated)      Patient Stated      Starting 01/12/2018, I will continue to take medications as prescribed.       Patient Stated      01/29/2021, I will maintain and continue medications as prescribed.       Patient Stated      Continue to take medications as prescribed.        This is a list of the screening recommended for you and due dates:  Health Maintenance  Topic Date Due   Zoster (Shingles) Vaccine (1 of 2) Never done   DTaP/Tdap/Td vaccine (2 - Td or Tdap) 01/21/2021   Mammogram  03/11/2022   COVID-19 Vaccine (5 - 2023-24 season) 03/13/2022   Medicare Annual Wellness Visit  01/31/2023   Flu Shot  02/11/2023   Pneumonia Vaccine  Completed   DEXA scan (bone density measurement)  Completed   HPV Vaccine  Aged Out   Colon Cancer Screening  Discontinued   Hepatitis C Screening  Discontinued    Advanced directives: Please bring a copy of your health care power of attorney and living will to the office to be added to your chart at your convenience.   Conditions/risks identified: Aim for 30 minutes of exercise or brisk walking, 6-8 glasses of water, and 5 servings of fruits and vegetables each day.   Next appointment: Follow up in one year for your annual wellness visit 02/03/24 @ 3:30 televisit   Preventive Care 65 Years and Older, Female Preventive care refers to lifestyle choices and visits with your health care provider that can promote health and wellness. What does preventive care include? A yearly physical exam. This is also called an annual well check. Dental exams once or twice a year. Routine eye exams. Ask your health care provider how often you should have  your eyes checked. Personal lifestyle choices, including: Daily care of your teeth and gums. Regular physical activity. Eating a healthy diet. Avoiding tobacco and drug use. Limiting alcohol use. Practicing safe sex. Taking low-dose aspirin every day. Taking vitamin and mineral supplements as recommended by your health care provider. What happens during an annual well check? The services and screenings done by your health care provider during your annual well check will depend on your age, overall health, lifestyle risk factors, and family history of disease. Counseling  Your health care provider may ask you questions about your: Alcohol use. Tobacco use. Drug use. Emotional well-being. Home and relationship well-being. Sexual activity. Eating habits. History of falls. Memory and ability to understand (cognition). Work and work Astronomer. Reproductive health. Screening  You may have the following tests or measurements: Height, weight, and BMI. Blood pressure. Lipid and cholesterol levels. These may be checked every 5 years, or more frequently if you are over 47 years old. Skin check. Lung cancer screening. You may have this screening every year starting at age 32 if you have a 30-pack-year history of smoking and currently smoke or have quit within the past 15 years. Fecal occult blood test (FOBT) of the stool. You may have this test every year starting at age 37. Flexible sigmoidoscopy  or colonoscopy. You may have a sigmoidoscopy every 5 years or a colonoscopy every 10 years starting at age 45. Hepatitis C blood test. Hepatitis B blood test. Sexually transmitted disease (STD) testing. Diabetes screening. This is done by checking your blood sugar (glucose) after you have not eaten for a while (fasting). You may have this done every 1-3 years. Bone density scan. This is done to screen for osteoporosis. You may have this done starting at age 34. Mammogram. This may be done every  1-2 years. Talk to your health care provider about how often you should have regular mammograms. Talk with your health care provider about your test results, treatment options, and if necessary, the need for more tests. Vaccines  Your health care provider may recommend certain vaccines, such as: Influenza vaccine. This is recommended every year. Tetanus, diphtheria, and acellular pertussis (Tdap, Td) vaccine. You may need a Td booster every 10 years. Zoster vaccine. You may need this after age 100. Pneumococcal 13-valent conjugate (PCV13) vaccine. One dose is recommended after age 28. Pneumococcal polysaccharide (PPSV23) vaccine. One dose is recommended after age 57. Talk to your health care provider about which screenings and vaccines you need and how often you need them. This information is not intended to replace advice given to you by your health care provider. Make sure you discuss any questions you have with your health care provider. Document Released: 07/26/2015 Document Revised: 03/18/2016 Document Reviewed: 04/30/2015 Elsevier Interactive Patient Education  2017 [employer].  Fall Prevention in the Home Falls can cause injuries. They can happen to people of all ages. There are many things you can do to make your home safe and to help prevent falls. What can I do on the outside of my home? Regularly fix the edges of walkways and driveways and fix any cracks. Remove anything that might make you trip as you walk through a door, such as a raised step or threshold. Trim any bushes or trees on the path to your home. Use bright outdoor lighting. Clear any walking paths of anything that might make someone trip, such as rocks or tools. Regularly check to see if handrails are loose or broken. Make sure that both sides of any steps have handrails. Any raised decks and porches should have guardrails on the edges. Have any leaves, snow, or ice cleared regularly. Use sand or salt on walking  paths during winter. Clean up any spills in your garage right away. This includes oil or grease spills. What can I do in the bathroom? Use night lights. Install grab bars by the toilet and in the tub and shower. Do not use towel bars as grab bars. Use non-skid mats or decals in the tub or shower. If you need to sit down in the shower, use a plastic, non-slip stool. Keep the floor dry. Clean up any water that spills on the floor as soon as it happens. Remove soap buildup in the tub or shower regularly. Attach bath mats securely with double-sided non-slip rug tape. Do not have throw rugs and other things on the floor that can make you trip. What can I do in the bedroom? Use night lights. Make sure that you have a light by your bed that is easy to reach. Do not use any sheets or blankets that are too big for your bed. They should not hang down onto the floor. Have a firm chair that has side arms. You can use this for support while you get dressed. Do  not have throw rugs and other things on the floor that can make you trip. What can I do in the kitchen? Clean up any spills right away. Avoid walking on wet floors. Keep items that you use a lot in easy-to-reach places. If you need to reach something above you, use a strong step stool that has a grab bar. Keep electrical cords out of the way. Do not use floor polish or wax that makes floors slippery. If you must use wax, use non-skid floor wax. Do not have throw rugs and other things on the floor that can make you trip. What can I do with my stairs? Do not leave any items on the stairs. Make sure that there are handrails on both sides of the stairs and use them. Fix handrails that are broken or loose. Make sure that handrails are as long as the stairways. Check any carpeting to make sure that it is firmly attached to the stairs. Fix any carpet that is loose or worn. Avoid having throw rugs at the top or bottom of the stairs. If you do have  throw rugs, attach them to the floor with carpet tape. Make sure that you have a light switch at the top of the stairs and the bottom of the stairs. If you do not have them, ask someone to add them for you. What else can I do to help prevent falls? Wear shoes that: Do not have high heels. Have rubber bottoms. Are comfortable and fit you well. Are closed at the toe. Do not wear sandals. If you use a stepladder: Make sure that it is fully opened. Do not climb a closed stepladder. Make sure that both sides of the stepladder are locked into place. Ask someone to hold it for you, if possible. Clearly mark and make sure that you can see: Any grab bars or handrails. First and last steps. Where the edge of each step is. Use tools that help you move around (mobility aids) if they are needed. These include: Canes. Walkers. Scooters. Crutches. Turn on the lights when you go into a dark area. Replace any light bulbs as soon as they burn out. Set up your furniture so you have a clear path. Avoid moving your furniture around. If any of your floors are uneven, fix them. If there are any pets around you, be aware of where they are. Review your medicines with your doctor. Some medicines can make you feel dizzy. This can increase your chance of falling. Ask your doctor what other things that you can do to help prevent falls. This information is not intended to replace advice given to you by your health care provider. Make sure you discuss any questions you have with your health care provider. Document Released: 04/25/2009 Document Revised: 12/05/2015 Document Reviewed: 08/03/2014 Elsevier Interactive Patient Education  2017 [employer].

## 2023-02-13 ENCOUNTER — Other Ambulatory Visit: Payer: Self-pay | Admitting: Family Medicine

## 2023-02-13 DIAGNOSIS — N1832 Chronic kidney disease, stage 3b: Secondary | ICD-10-CM

## 2023-02-13 DIAGNOSIS — F1090 Alcohol use, unspecified, uncomplicated: Secondary | ICD-10-CM

## 2023-02-13 DIAGNOSIS — E038 Other specified hypothyroidism: Secondary | ICD-10-CM

## 2023-02-13 DIAGNOSIS — M810 Age-related osteoporosis without current pathological fracture: Secondary | ICD-10-CM

## 2023-02-13 DIAGNOSIS — E785 Hyperlipidemia, unspecified: Secondary | ICD-10-CM

## 2023-02-13 DIAGNOSIS — E538 Deficiency of other specified B group vitamins: Secondary | ICD-10-CM

## 2023-02-19 ENCOUNTER — Other Ambulatory Visit (INDEPENDENT_AMBULATORY_CARE_PROVIDER_SITE_OTHER): Payer: Medicare Other

## 2023-02-19 DIAGNOSIS — N1832 Chronic kidney disease, stage 3b: Secondary | ICD-10-CM | POA: Diagnosis not present

## 2023-02-19 DIAGNOSIS — M810 Age-related osteoporosis without current pathological fracture: Secondary | ICD-10-CM

## 2023-02-19 DIAGNOSIS — F1090 Alcohol use, unspecified, uncomplicated: Secondary | ICD-10-CM | POA: Diagnosis not present

## 2023-02-19 DIAGNOSIS — E038 Other specified hypothyroidism: Secondary | ICD-10-CM | POA: Diagnosis not present

## 2023-02-19 DIAGNOSIS — D631 Anemia in chronic kidney disease: Secondary | ICD-10-CM | POA: Diagnosis not present

## 2023-02-19 DIAGNOSIS — E785 Hyperlipidemia, unspecified: Secondary | ICD-10-CM | POA: Diagnosis not present

## 2023-02-19 DIAGNOSIS — E538 Deficiency of other specified B group vitamins: Secondary | ICD-10-CM

## 2023-02-19 LAB — LIPID PANEL
Cholesterol: 173 mg/dL (ref 0–200)
HDL: 61.7 mg/dL (ref >39.00)
LDL Cholesterol: 86 mg/dL (ref 0–99)
NonHDL: 111.18
Total CHOL/HDL Ratio: 3
Triglycerides: 124 mg/dL (ref 0.0–149.0)
VLDL: 24.8 mg/dL (ref 0.0–40.0)

## 2023-02-19 LAB — CBC WITH DIFFERENTIAL/PLATELET
Basophils Absolute: 0 10*3/uL (ref 0.0–0.1)
Basophils Relative: 1.2 % (ref 0.0–3.0)
Eosinophils Absolute: 0.1 10*3/uL (ref 0.0–0.7)
Eosinophils Relative: 1.6 % (ref 0.0–5.0)
HCT: 31.2 % — ABNORMAL LOW (ref 36.0–46.0)
Hemoglobin: 10 g/dL — ABNORMAL LOW (ref 12.0–15.0)
Lymphocytes Relative: 25 % (ref 12.0–46.0)
Lymphs Abs: 0.9 10*3/uL (ref 0.7–4.0)
MCHC: 32.1 g/dL (ref 30.0–36.0)
MCV: 91.9 fl (ref 78.0–100.0)
Monocytes Absolute: 0.5 10*3/uL (ref 0.1–1.0)
Monocytes Relative: 15.1 % — ABNORMAL HIGH (ref 3.0–12.0)
Neutro Abs: 2.1 10*3/uL (ref 1.4–7.7)
Neutrophils Relative %: 57.1 % (ref 43.0–77.0)
Platelets: 341 10*3/uL (ref 150.0–400.0)
RBC: 3.4 Mil/uL — ABNORMAL LOW (ref 3.87–5.11)
RDW: 13.6 % (ref 11.5–15.5)
WBC: 3.6 10*3/uL — ABNORMAL LOW (ref 4.0–10.5)

## 2023-02-19 LAB — TSH: TSH: 3.92 u[IU]/mL (ref 0.35–5.50)

## 2023-02-19 LAB — RENAL FUNCTION PANEL
Albumin: 4.4 g/dL (ref 3.5–5.2)
BUN: 26 mg/dL — ABNORMAL HIGH (ref 6–23)
CO2: 28 mEq/L (ref 19–32)
Calcium: 10 mg/dL (ref 8.4–10.5)
Chloride: 102 mEq/L (ref 96–112)
Creatinine, Ser: 1.29 mg/dL — ABNORMAL HIGH (ref 0.40–1.20)
GFR: 38.79 mL/min — ABNORMAL LOW (ref >60.00)
Glucose, Bld: 128 mg/dL — ABNORMAL HIGH (ref 70–99)
Phosphorus: 4 mg/dL (ref 2.3–4.6)
Potassium: 4.1 mEq/L (ref 3.5–5.1)
Sodium: 140 mEq/L (ref 135–145)

## 2023-02-19 LAB — VITAMIN D 25 HYDROXY (VIT D DEFICIENCY, FRACTURES): VITD: 82.26 ng/mL (ref 30.00–100.00)

## 2023-02-19 LAB — VITAMIN B12: Vitamin B-12: 1008 pg/mL — ABNORMAL HIGH (ref 211–911)

## 2023-02-19 LAB — T4, FREE: Free T4: 0.64 ng/dL (ref 0.60–1.60)

## 2023-02-19 LAB — FOLATE: Folate: >23.8 ng/mL (ref >5.9)

## 2023-02-19 NOTE — Addendum Note (Signed)
Addended by: Damita Lack on: 02/19/2023 10:02 AM   Modules accepted: Orders

## 2023-02-23 ENCOUNTER — Other Ambulatory Visit: Payer: Self-pay | Admitting: Family Medicine

## 2023-02-23 DIAGNOSIS — I10 Essential (primary) hypertension: Secondary | ICD-10-CM

## 2023-02-26 ENCOUNTER — Encounter: Payer: Self-pay | Admitting: Family Medicine

## 2023-02-26 ENCOUNTER — Ambulatory Visit (INDEPENDENT_AMBULATORY_CARE_PROVIDER_SITE_OTHER): Payer: Medicare Other | Admitting: Family Medicine

## 2023-02-26 ENCOUNTER — Telehealth: Payer: Self-pay | Admitting: Family Medicine

## 2023-02-26 VITALS — BP 122/74 | HR 79 | Temp 97.4°F | Ht 60.5 in | Wt 90.0 lb

## 2023-02-26 DIAGNOSIS — Z0000 Encounter for general adult medical examination without abnormal findings: Secondary | ICD-10-CM | POA: Diagnosis not present

## 2023-02-26 DIAGNOSIS — N1832 Chronic kidney disease, stage 3b: Secondary | ICD-10-CM

## 2023-02-26 DIAGNOSIS — E038 Other specified hypothyroidism: Secondary | ICD-10-CM | POA: Diagnosis not present

## 2023-02-26 DIAGNOSIS — E538 Deficiency of other specified B group vitamins: Secondary | ICD-10-CM | POA: Diagnosis not present

## 2023-02-26 DIAGNOSIS — D631 Anemia in chronic kidney disease: Secondary | ICD-10-CM

## 2023-02-26 DIAGNOSIS — M810 Age-related osteoporosis without current pathological fracture: Secondary | ICD-10-CM

## 2023-02-26 DIAGNOSIS — E785 Hyperlipidemia, unspecified: Secondary | ICD-10-CM | POA: Diagnosis not present

## 2023-02-26 DIAGNOSIS — Z91013 Allergy to seafood: Secondary | ICD-10-CM | POA: Diagnosis not present

## 2023-02-26 DIAGNOSIS — F1090 Alcohol use, unspecified, uncomplicated: Secondary | ICD-10-CM | POA: Diagnosis not present

## 2023-02-26 DIAGNOSIS — I10 Essential (primary) hypertension: Secondary | ICD-10-CM

## 2023-02-26 DIAGNOSIS — Z7189 Other specified counseling: Secondary | ICD-10-CM

## 2023-02-26 LAB — MICROALBUMIN / CREATININE URINE RATIO
Creatinine,U: 82.6 mg/dL
Microalb Creat Ratio: 0.8 mg/g (ref 0.0–30.0)
Microalb, Ur: <0.7 mg/dL (ref 0.0–1.9)

## 2023-02-26 MED ORDER — POTASSIUM CHLORIDE ER 10 MEQ PO TBCR: 10.0000 meq | EXTENDED_RELEASE_TABLET | Freq: Every day | ORAL | 4 refills | Status: DC

## 2023-02-26 MED ORDER — LOSARTAN POTASSIUM-HCTZ 50-12.5 MG PO TABS: 1.0000 | ORAL_TABLET | Freq: Every day | ORAL | 4 refills | Status: DC

## 2023-02-26 MED ORDER — VITAMIN B-12 500 MCG PO TABS: 500.0000 ug | ORAL_TABLET | ORAL | Status: AC

## 2023-02-26 NOTE — Assessment & Plan Note (Signed)
Preventative protocols reviewed and updated unless pt declined. Discussed healthy diet and lifestyle.  

## 2023-02-26 NOTE — Assessment & Plan Note (Signed)
Chronic, off statin. The ASCVD Risk score (Arnett DK, et al., 2019) failed to calculate for the following reasons:   The 2019 ASCVD risk score is only valid for ages 90 to 46

## 2023-02-26 NOTE — Assessment & Plan Note (Signed)
TFTs remain normal.

## 2023-02-26 NOTE — Assessment & Plan Note (Signed)
Chronic, stable on current regimen - combine regimen to combo hyzaar.

## 2023-02-26 NOTE — Assessment & Plan Note (Signed)
Appreciate hematology care. Stable period.

## 2023-02-26 NOTE — Assessment & Plan Note (Signed)
Levels elevated - drop replacement to once weekly

## 2023-02-26 NOTE — Telephone Encounter (Signed)
Called phn # provided in message- belongs to pt's daughter, Roanna Raider.   Spoke with pt at home # in chart [phone]) returning her call. Pt says she forgot to ask Dr. Reece Agar can help her get a back brace and walker. Plz advise.

## 2023-02-26 NOTE — Assessment & Plan Note (Signed)
Declines rpt DEXA.  Continue calcium and vitamin D supplements.

## 2023-02-26 NOTE — Assessment & Plan Note (Addendum)
Advanced directive discussion - advanced directive packet provided previously. She has talked with family. Daughter [name] who is an Charity fundraiser is Product manager. Doesn't want prolonged life support if terminal condition.

## 2023-02-26 NOTE — Telephone Encounter (Signed)
Patient called saying she forgot to tell the doctor something can the nurse give her a call. Patient can be reach at [phone].

## 2023-02-26 NOTE — Assessment & Plan Note (Signed)
Chronic, GFR 30s. Encouraged good hydration status.

## 2023-02-26 NOTE — Telephone Encounter (Signed)
We didn't talk about this at OV >  What issues is she having to need back brace and walker?

## 2023-02-26 NOTE — Assessment & Plan Note (Signed)
She has significantly cut down in the past year.
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
## 2023-02-26 NOTE — Patient Instructions (Addendum)
Call to schedule mammogram at your convenience: [facility] in [place] [phone] If interested in RSV shot, check with local pharmacy.  Schedule eye doctor appointment.  Good to see you today Return as needed or in 1 year for next physical.

## 2023-02-27 ENCOUNTER — Other Ambulatory Visit: Payer: Self-pay | Admitting: Family Medicine

## 2023-03-01 NOTE — Telephone Encounter (Signed)
Called patient reviewed information. Placed order with list of supply stores near patient per her request. She will come by office and pick up both. She will get brace from pharmacy and set up visit if does not help.

## 2023-03-01 NOTE — Telephone Encounter (Signed)
Called patient when he goes to grocery store or something that she can set down when she gets fatigued. Would like small walker with seat. As far as the back brace patient had one that she used when she worked that helped with back pain when moving around. Sounds like it was just over the counter brace.

## 2023-03-01 NOTE — Telephone Encounter (Addendum)
Rx written for rolling walker, placed in Margaret Nguyen's box.  She may take this to local durable medical supply store.  She can get OTC lumbar support brace. If desires Rx, recommend OV to eval back pain as I haven't recently seen her for this

## 2023-05-03 ENCOUNTER — Inpatient Hospital Stay: Payer: Medicare Other | Attending: Oncology

## 2023-05-03 DIAGNOSIS — Z8042 Family history of malignant neoplasm of prostate: Secondary | ICD-10-CM | POA: Diagnosis not present

## 2023-05-03 DIAGNOSIS — Z8249 Family history of ischemic heart disease and other diseases of the circulatory system: Secondary | ICD-10-CM | POA: Diagnosis not present

## 2023-05-03 DIAGNOSIS — Z90710 Acquired absence of both cervix and uterus: Secondary | ICD-10-CM | POA: Insufficient documentation

## 2023-05-03 DIAGNOSIS — N189 Chronic kidney disease, unspecified: Secondary | ICD-10-CM | POA: Diagnosis not present

## 2023-05-03 DIAGNOSIS — Z860101 Personal history of adenomatous and serrated colon polyps: Secondary | ICD-10-CM | POA: Insufficient documentation

## 2023-05-03 DIAGNOSIS — R7989 Other specified abnormal findings of blood chemistry: Secondary | ICD-10-CM | POA: Diagnosis not present

## 2023-05-03 DIAGNOSIS — D631 Anemia in chronic kidney disease: Secondary | ICD-10-CM | POA: Diagnosis not present

## 2023-05-03 DIAGNOSIS — Z8261 Family history of arthritis: Secondary | ICD-10-CM | POA: Diagnosis not present

## 2023-05-03 DIAGNOSIS — N1832 Chronic kidney disease, stage 3b: Secondary | ICD-10-CM

## 2023-05-03 DIAGNOSIS — Z79899 Other long term (current) drug therapy: Secondary | ICD-10-CM | POA: Diagnosis not present

## 2023-05-03 DIAGNOSIS — R634 Abnormal weight loss: Secondary | ICD-10-CM | POA: Insufficient documentation

## 2023-05-03 DIAGNOSIS — I129 Hypertensive chronic kidney disease with stage 1 through stage 4 chronic kidney disease, or unspecified chronic kidney disease: Secondary | ICD-10-CM | POA: Insufficient documentation

## 2023-05-03 LAB — CBC WITH DIFFERENTIAL (CANCER CENTER ONLY)
Abs Immature Granulocytes: 0.01 10*3/uL (ref 0.00–0.07)
Basophils Absolute: 0 10*3/uL (ref 0.0–0.1)
Basophils Relative: 0 %
Eosinophils Absolute: 0 10*3/uL (ref 0.0–0.5)
Eosinophils Relative: 1 %
HCT: 33 % — ABNORMAL LOW (ref 36.0–46.0)
Hemoglobin: 10.4 g/dL — ABNORMAL LOW (ref 12.0–15.0)
Immature Granulocytes: 0 %
Lymphocytes Relative: 25 %
Lymphs Abs: 1 10*3/uL (ref 0.7–4.0)
MCH: 27.4 pg (ref 26.0–34.0)
MCHC: 31.5 g/dL (ref 30.0–36.0)
MCV: 87.1 fL (ref 80.0–100.0)
Monocytes Absolute: 0.6 10*3/uL (ref 0.1–1.0)
Monocytes Relative: 14 %
Neutro Abs: 2.5 10*3/uL (ref 1.7–7.7)
Neutrophils Relative %: 60 %
Platelet Count: 263 10*3/uL (ref 150–400)
RBC: 3.79 MIL/uL — ABNORMAL LOW (ref 3.87–5.11)
RDW: 12.5 % (ref 11.5–15.5)
WBC Count: 4.2 10*3/uL (ref 4.0–10.5)
nRBC: 0 % (ref 0.0–0.2)

## 2023-05-03 LAB — IRON AND TIBC
Iron: 62 ug/dL (ref 28–170)
Saturation Ratios: 17 % (ref 10.4–31.8)
TIBC: 377 ug/dL (ref 250–450)
UIBC: 315 ug/dL

## 2023-05-03 LAB — RETIC PANEL
Immature Retic Fract: 5.4 % (ref 2.3–15.9)
RBC.: 3.71 MIL/uL — ABNORMAL LOW (ref 3.87–5.11)
Retic Count, Absolute: 25.6 10*3/uL (ref 19.0–186.0)
Retic Ct Pct: 0.7 % (ref 0.4–3.1)
Reticulocyte Hemoglobin: 30.1 pg (ref >27.9)

## 2023-05-03 LAB — FERRITIN: Ferritin: 261 ng/mL (ref 11–307)

## 2023-05-06 ENCOUNTER — Inpatient Hospital Stay: Payer: Medicare Other | Admitting: Oncology

## 2023-05-06 ENCOUNTER — Encounter: Payer: Self-pay | Admitting: Oncology

## 2023-05-06 ENCOUNTER — Inpatient Hospital Stay: Payer: Medicare Other

## 2023-05-06 VITALS — BP 143/73 | HR 76 | Temp 97.6°F | Resp 18 | Wt 94.3 lb

## 2023-05-06 DIAGNOSIS — N189 Chronic kidney disease, unspecified: Secondary | ICD-10-CM | POA: Diagnosis not present

## 2023-05-06 DIAGNOSIS — N1832 Chronic kidney disease, stage 3b: Secondary | ICD-10-CM | POA: Diagnosis not present

## 2023-05-06 DIAGNOSIS — Z8249 Family history of ischemic heart disease and other diseases of the circulatory system: Secondary | ICD-10-CM | POA: Diagnosis not present

## 2023-05-06 DIAGNOSIS — K709 Alcoholic liver disease, unspecified: Secondary | ICD-10-CM

## 2023-05-06 DIAGNOSIS — R634 Abnormal weight loss: Secondary | ICD-10-CM | POA: Diagnosis not present

## 2023-05-06 DIAGNOSIS — R7989 Other specified abnormal findings of blood chemistry: Secondary | ICD-10-CM | POA: Diagnosis not present

## 2023-05-06 DIAGNOSIS — Z860101 Personal history of adenomatous and serrated colon polyps: Secondary | ICD-10-CM | POA: Diagnosis not present

## 2023-05-06 DIAGNOSIS — Z79899 Other long term (current) drug therapy: Secondary | ICD-10-CM | POA: Diagnosis not present

## 2023-05-06 DIAGNOSIS — Z8261 Family history of arthritis: Secondary | ICD-10-CM | POA: Diagnosis not present

## 2023-05-06 DIAGNOSIS — I129 Hypertensive chronic kidney disease with stage 1 through stage 4 chronic kidney disease, or unspecified chronic kidney disease: Secondary | ICD-10-CM | POA: Diagnosis not present

## 2023-05-06 DIAGNOSIS — D631 Anemia in chronic kidney disease: Secondary | ICD-10-CM | POA: Diagnosis not present

## 2023-05-06 HISTORY — DX: Alcoholic liver disease, unspecified: K70.9

## 2023-05-06 NOTE — Progress Notes (Signed)
Hematology/Oncology Progress note [phone] [phone]            Patient Care Team: [name], MD as PCP - General (Family Medicine)  ASSESSMENT & PLAN:   Anemia in chronic kidney disease (CKD) Anemia is likely secondary to chronic kidney disease and alcohol induced bone marrow suppression. Lab Results  Component Value Date   HGB 10.4 (L) 05/03/2023   TIBC 377 05/03/2023   IRONPCTSAT 17 05/03/2023   FERRITIN 261 05/03/2023    Hemoglobin has improved likely due to decreased alcohol use. .  I will hold off EPO.   Elevated ferritin negative hepatitis panel, negative hemochromatosis DNA mutation screening.  Likely due to chronic inflammation/alcohol use. 02/22/2021.ultrasound abdomen done on  heterogeneous liver parenchyma without discrete focal lesion.?  Fatty liver disease/chronic alcoholic liver disease.  12/19/21 ultrasound abdomen recently showed no focal lesion identified. Within normal limits in parenchymal echogenicity.  Ferritin level is chronically elevated, improved, likely due to her alcohol cessation efforts. Encourage her to continue  Orders Placed This Encounter  Procedures   CBC with Differential (Cancer Center Only)    Standing Status:   Future    Standing Expiration Date:   05/05/2024   Ferritin    Standing Status:   Future    Standing Expiration Date:   05/05/2024   Iron and TIBC    Standing Status:   Future    Standing Expiration Date:   05/05/2024   Follow-up in 6 months All questions were answered. The patient knows to call the clinic with any problems, questions or concerns.  [name], MD, PhD [facility] Hematology Oncology 05/06/2023   CHIEF COMPLAINTS/REASON FOR VISIT:  Follow-up for anemia, elevated ferritin  HISTORY OF PRESENTING ILLNESS:   Margaret Nguyen is a  82 y.o.  female with PMH listed below was seen in consultation at the request of  [name], MD  for evaluation of  anemia  08/22/2021 cbc showed wbc 1.9, hemoglobin 9.8, normal platelet. SPEP showed no m protein.  Iron panel showed saturation of 35, ferritin of 554.  Reviewed previous records, patient has a chronic history of elevated ferritin. 09/26/2021 Cbc showed wbc 3.2, hemoglobin 10.2, normal platelet.  B12 400,   Patient drinks alcohol routinely.  She drinks 2 shots of vodka daily.  She reports that ever since she was notified that she is referred to see hematology oncology, she has been drinking slightly more. She feels well otherwise.  She has lost weight since her dental work which caused her to have mild sore.  She also has had chronic night sweating ever since 1970s.  No fever.  INTERVAL HISTORY Margaret Nguyen is a 82 y.o. female who has above history reviewed by me today presents for follow up visit for management of anemia. Elevated ferritin She has stopped alcohol consumption.  She denies any new complaints.  Appetite is fair.  Weight is stable.  Review of Systems  Constitutional:  Negative for appetite change, chills, fatigue, fever and unexpected weight change.  HENT:   Negative for hearing loss and voice change.   Eyes:  Negative for eye problems.  Respiratory:  Negative for chest tightness and cough.   Cardiovascular:  Negative for chest pain.  Gastrointestinal:  Negative for abdominal distention, abdominal pain and blood in stool.  Endocrine: Negative for hot flashes.  Genitourinary:  Negative for difficulty urinating and frequency.   Musculoskeletal:  Negative for arthralgias.  Skin:  Negative for itching and rash.  Neurological:  Negative for extremity weakness.  Hematological:  Negative for adenopathy.  Psychiatric/Behavioral:  Negative for confusion.     MEDICAL HISTORY:  Past Medical History:  Diagnosis Date   Allergy    Anemia    Arthritis    Blood transfusion without reported diagnosis    Diverticulitis    Family hx colonic polyps    Heart murmur    has  Rheumatic fever as a child    Hiatal hernia    Hypertension    Osteoporosis 08/2009   R femur -2.5   Villous adenoma of colon     SURGICAL HISTORY: Past Surgical History:  Procedure Laterality Date   ABDOMINAL HYSTERECTOMY     COLONOSCOPY  04/2010   polyps, diverticulosis, hem, rec rpt 3 yrs Russella Dar)   COLONOSCOPY  04/2014   mult TA, one with high grade dysplasia, mod diverticulosis, rpt 3 yrs Russella Dar)   COLONOSCOPY  03/2019   1 TA, diverticulosis, no f/u planned Russella Dar)   DEXA  08/2009   femur -2.5   DEXA  04/2015   T -3 hips, -1.8 spine   LAPAROSCOPIC SIGMOID COLECTOMY  1999?   villous adenoma   PARTIAL HYSTERECTOMY     ovaries remain   TOE SURGERY     TUBAL LIGATION      SOCIAL HISTORY: Social History   Socioeconomic History   Marital status: Widowed    Spouse name: Not on file   Number of children: Not on file   Years of education: Not on file   Highest education level: Not on file  Occupational History   Not on file  Tobacco Use   Smoking status: Never    Passive exposure: Past   Smokeless tobacco: Never  Vaping Use   Vaping status: Never Used  Substance and Sexual Activity   Alcohol use: Yes    Alcohol/week: 2.0 standard drinks of alcohol    Types: 2 Shots of liquor per week    Comment: 2 drinks of vodka daily. / a 5th every 2 weeks   Drug use: Never   Sexual activity: Not Currently  Other Topics Concern   Not on file  Social History Narrative   Married [phone], widowed   1 son, 2 daughters, 1 son deceased   5 grandchildren   Lives alone; I- ADLs   Working-fulltime [employer]         Social Determinants of Health   Financial Resource Strain: Low Risk  (02/02/2023)   Overall Financial Resource Strain (CARDIA)    Difficulty of Paying Living Expenses: Not hard at all  Food Insecurity: No Food Insecurity (02/02/2023)   Hunger Vital Sign    Worried About Running Out of Food in the Last Year: Never true    Ran Out of Food in the Last Year:  Never true  Transportation Needs: No Transportation Needs (02/02/2023)   PRAPARE - Administrator, Civil Service (Medical): No    Lack of Transportation (Non-Medical): No  Physical Activity: Inactive (02/02/2023)   Exercise Vital Sign    Days of Exercise per Week: 0 days    Minutes of Exercise per Session: 0 min  Stress: No Stress Concern Present (02/02/2023)   [employer] of Occupational Health - Occupational Stress Questionnaire    Feeling of Stress : Not at all  Social Connections: Socially Isolated (02/02/2023)   Social Connection and Isolation Panel [NHANES]    Frequency of Communication with Friends and Family: More than three times a  week    Frequency of Social Gatherings with Friends and Family: More than three times a week    Attends Religious Services: Never    Database administrator or Organizations: No    Attends Banker Meetings: Never    Marital Status: Widowed  Intimate Partner Violence: Not At Risk (02/02/2023)   Humiliation, Afraid, Rape, and Kick questionnaire    Fear of Current or Ex-Partner: No    Emotionally Abused: No    Physically Abused: No    Sexually Abused: No    FAMILY HISTORY: Family History  Problem Relation Age of Onset   Arthritis Mother    Arthritis Father    Heart disease Father    Hypertension Father    Allergic rhinitis Daughter    Allergic rhinitis Son    Prostate cancer Son    Colon cancer Neg Hx    Esophageal cancer Neg Hx    Rectal cancer Neg Hx    Stomach cancer Neg Hx    Colon polyps Neg Hx     ALLERGIES:  is allergic to other and shellfish allergy.  MEDICATIONS:  Current Outpatient Medications  Medication Sig Dispense Refill   acetaminophen (TYLENOL) 500 MG tablet Take 1 tablet (500 mg total) by mouth every 6 (six) hours as needed. 30 tablet 0   Calcium Carb-Cholecalciferol 500-400 MG-UNIT TABS Take by mouth.     Cholecalciferol (VITAMIN D3) 25 MCG (1000 UT) CAPS Take by mouth.     colchicine 0.6  MG tablet TAKE 1 TABLET (0.6 MG TOTAL) BY MOUTH DAILY AS NEEDED (GOUT FLARE). 30 tablet 1   losartan-hydrochlorothiazide (HYZAAR) 50-12.5 MG tablet Take 1 tablet by mouth daily. 90 tablet 4   Multiple Vitamins-Minerals (CENTRUM PO) Take by mouth.     potassium chloride (KLOR-CON) 10 MEQ tablet Take 1 tablet (10 mEq total) by mouth daily. 90 tablet 4   vitamin B-12 (CYANOCOBALAMIN) 500 MCG tablet Take 1 tablet (500 mcg total) by mouth once a week.     vitamin C (ASCORBIC ACID) 500 MG tablet Take 500 mg by mouth daily.     No current facility-administered medications for this visit.     PHYSICAL EXAMINATION: ECOG PERFORMANCE STATUS: 1 - Symptomatic but completely ambulatory Vitals:   05/06/23 1448  BP: (!) 143/73  Pulse: 76  Resp: 18  Temp: 97.6 F (36.4 C)  SpO2: 100%   Filed Weights   05/06/23 1448  Weight: 94 lb 4.8 oz (42.8 kg)    Physical Exam Constitutional:      General: She is not in acute distress.    Comments: Frail thin built elderly female, ambulates independently  HENT:     Head: Normocephalic and atraumatic.  Eyes:     General: No scleral icterus. Cardiovascular:     Rate and Rhythm: Normal rate and regular rhythm.     Heart sounds: Normal heart sounds.  Pulmonary:     Effort: Pulmonary effort is normal. No respiratory distress.     Breath sounds: No wheezing.  Abdominal:     General: Bowel sounds are normal. There is no distension.     Palpations: Abdomen is soft.  Musculoskeletal:        General: No deformity. Normal range of motion.     Cervical back: Normal range of motion and neck supple.  Skin:    General: Skin is warm and dry.     Findings: No erythema or rash.  Neurological:     Mental Status: She  is alert and oriented to person, place, and time. Mental status is at baseline.     Cranial Nerves: No cranial nerve deficit.  Psychiatric:        Mood and Affect: Mood normal.     LABORATORY DATA:  I have reviewed the data as listed    Latest  Ref Rng & Units 05/03/2023    1:07 PM 02/19/2023    9:08 AM 02/01/2023   12:46 PM  CBC  WBC 4.0 - 10.5 K/uL 4.2  3.6  4.0   Hemoglobin 12.0 - 15.0 g/dL 01.0  27.2  9.7   Hematocrit 36.0 - 46.0 % 33.0  31.2  30.9   Platelets 150 - 400 K/uL 263  341.0  218       Latest Ref Rng & Units 02/19/2023    9:08 AM 02/01/2023   12:46 PM 08/03/2022    8:59 AM  CMP  Glucose 70 - 99 mg/dL 536  644  034   BUN 6 - 23 mg/dL 26  20  27    Creatinine 0.40 - 1.20 mg/dL 7.42  5.95  6.38   Sodium 135 - 145 mEq/L 140  137  139   Potassium 3.5 - 5.1 mEq/L 4.1  3.8  4.1   Chloride 96 - 112 mEq/L 102  101  102   CO2 19 - 32 mEq/L 28  25  26    Calcium 8.4 - 10.5 mg/dL 75.6  9.2  9.6   Total Protein 6.5 - 8.1 g/dL  7.4  8.3   Total Bilirubin 0.3 - 1.2 mg/dL  0.3  0.4   Alkaline Phos 38 - 126 U/L  49  48   AST 15 - 41 U/L  28  23   ALT 0 - 44 U/L  14  11      Iron/TIBC/Ferritin/ %Sat    Component Value Date/Time   IRON 62 05/03/2023 1307   TIBC 377 05/03/2023 1307   FERRITIN 261 05/03/2023 1307   IRONPCTSAT 17 05/03/2023 1307      RADIOGRAPHIC STUDIES: I have personally reviewed the radiological images as listed and agreed with the findings in the report. No results found.

## 2023-05-06 NOTE — Assessment & Plan Note (Signed)
negative hepatitis panel, negative hemochromatosis DNA mutation screening.  Likely due to chronic inflammation/alcohol use. 02/22/2021.ultrasound abdomen done on  heterogeneous liver parenchyma without discrete focal lesion.?  Fatty liver disease/chronic alcoholic liver disease.  12/19/21 ultrasound abdomen recently showed no focal lesion identified. Within normal limits in parenchymal echogenicity.  Ferritin level is chronically elevated, improved, likely due to her alcohol cessation efforts. Encourage her to continue

## 2023-05-06 NOTE — Assessment & Plan Note (Addendum)
Anemia is likely secondary to chronic kidney disease and alcohol induced bone marrow suppression. Lab Results  Component Value Date   HGB 10.4 (L) 05/03/2023   TIBC 377 05/03/2023   IRONPCTSAT 17 05/03/2023   FERRITIN 261 05/03/2023    Hemoglobin has improved likely due to decreased alcohol use. .  I will hold off EPO.

## 2023-10-25 ENCOUNTER — Telehealth: Payer: Self-pay | Admitting: *Deleted

## 2023-10-25 NOTE — Telephone Encounter (Signed)
 she says that she is having alot allergies and don't want to come to the lab encounter and the MD several days after labs. she states that she will call back when allergies are better . I asked if we can reschedule but she says that she will call us  when ready

## 2023-10-26 ENCOUNTER — Inpatient Hospital Stay: Payer: Medicare Other

## 2023-11-02 ENCOUNTER — Inpatient Hospital Stay: Payer: Medicare Other | Admitting: Oncology

## 2024-02-04 ENCOUNTER — Ambulatory Visit: Payer: Medicare Other

## 2024-02-04 VITALS — Ht 60.5 in | Wt 94.0 lb

## 2024-02-04 DIAGNOSIS — Z0100 Encounter for examination of eyes and vision without abnormal findings: Secondary | ICD-10-CM

## 2024-02-04 DIAGNOSIS — Z0000 Encounter for general adult medical examination without abnormal findings: Secondary | ICD-10-CM | POA: Diagnosis not present

## 2024-02-04 NOTE — Patient Instructions (Addendum)
 Margaret Nguyen , Thank you for taking time out of your busy schedule to complete your Annual Wellness Visit with me. I enjoyed our conversation and look forward to speaking with you again next year. I, as well as your care team,  appreciate your ongoing commitment to your health goals. Please review the following plan we discussed and let me know if I can assist you in the future. Your Game plan/ To Do List   A referral has been placed for eye exam per request of patient. [facility] [address] [place] [address] Ph [phone]  Follow up Visits: Next Medicare AWV with our clinical staff: 02/06/25@3 :40pm   Have you seen your provider in the last 6 months (3 months if uncontrolled diabetes)? No Next Office Visit with your provider: 04/25/24 physical  Clinician Recommendations:  Aim for 30 minutes of exercise or brisk walking, 6-8 glasses of water, and 5 servings of fruits and vegetables each day.       This is a list of the screening recommended for you and due dates:  Health Maintenance  Topic Date Due   Zoster (Shingles) Vaccine (1 of 2) Never done   DTaP/Tdap/Td vaccine (2 - Td or Tdap) 01/21/2021   Mammogram  03/11/2022   COVID-19 Vaccine (6 - 2024-25 season) 10/18/2023   Flu Shot  02/11/2024   Medicare Annual Wellness Visit  02/03/2025   Pneumococcal Vaccine for age over 4  Completed   DEXA scan (bone density measurement)  Completed   Hepatitis B Vaccine  Aged Out   HPV Vaccine  Aged Out   Meningitis B Vaccine  Aged Out   Colon Cancer Screening  Discontinued   Hepatitis C Screening  Discontinued    Advanced directives: (Declined) Advance directive discussed with you today. Even though you declined this today, please call our office should you change your mind, and we can give you the proper paperwork for you to fill out. Advance Care Planning is important because it:  [x]  Makes sure you receive the medical care that is consistent with your values, goals,  and preferences  [x]  It provides guidance to your family and loved ones and reduces their decisional burden about whether or not they are making the right decisions based on your wishes.  Follow the link provided in your after visit summary or read over the paperwork we have mailed to you to help you started getting your Advance Directives in place. If you need assistance in completing these, please reach out to us  so that we can help you!

## 2024-02-04 NOTE — Progress Notes (Addendum)
 Subjective:   Margaret Nguyen is a 83 y.o. who presents for a Medicare Wellness preventive visit.  As a reminder, Annual Wellness Visits don't include a physical exam, and some assessments may be limited, especially if this visit is performed virtually. We may recommend an in-person follow-up visit with your provider if needed.  Visit Complete: Virtual I connected with  Margaret Nguyen on 02/04/24 by a audio enabled telemedicine application and verified that I am speaking with the correct person using two identifiers.  Patient Location: Home  Provider Location: Home Office  I discussed the limitations of evaluation and management by telemedicine. The patient expressed understanding and agreed to proceed.  Vital Signs: Because this visit was a virtual/telehealth visit, some criteria may be missing or patient reported. Any vitals not documented were not able to be obtained and vitals that have been documented are patient reported.  VideoDeclined- This patient declined Librarian, academic. Therefore the visit was completed with audio only.  Persons Participating in Visit: Patient.  AWV Questionnaire: No: Patient Medicare AWV questionnaire was not completed prior to this visit.  Cardiac Risk Factors include: advanced age (>57men, >29 women);hypertension;sedentary lifestyle     Objective:    Today's Vitals   02/04/24 1542  Weight: 94 lb (42.6 kg)  Height: 5' 0.5 (1.537 m)   Body mass index is 18.06 kg/m.     02/04/2024    3:55 PM 05/06/2023    2:55 PM 02/02/2023    3:33 PM 02/01/2023    1:13 PM 01/30/2022    9:51 AM 11/20/2021   10:12 AM 10/24/2021    9:37 AM  Advanced Directives  Does Patient Have a Medical Advance Directive? No Yes Yes Yes Yes Yes Yes  Type of Science writer of [place];Living will Living will Healthcare Power of [place];Living will  Living will  Does patient want to  make changes to medical advance directive?     No - Patient declined    Copy of Healthcare Power of Attorney in Chart?  No - copy requested No - copy requested  No - copy requested    Would patient like information on creating a medical advance directive?     No - Patient declined      Current Medications (verified) Outpatient Encounter Medications as of 02/04/2024  Medication Sig   acetaminophen  (TYLENOL ) 500 MG tablet Take 1 tablet (500 mg total) by mouth every 6 (six) hours as needed.   Calcium  Carb-Cholecalciferol 500-400 MG-UNIT TABS Take by mouth.   Cholecalciferol (VITAMIN D3) 25 MCG (1000 UT) CAPS Take by mouth.   colchicine  0.6 MG tablet TAKE 1 TABLET (0.6 MG TOTAL) BY MOUTH DAILY AS NEEDED (GOUT FLARE).   losartan -hydrochlorothiazide  (HYZAAR) 50-12.5 MG tablet Take 1 tablet by mouth daily.   Multiple Vitamins-Minerals (CENTRUM PO) Take by mouth.   potassium chloride  (KLOR-CON ) 10 MEQ tablet Take 1 tablet (10 mEq total) by mouth daily.   vitamin B-12 (CYANOCOBALAMIN ) 500 MCG tablet Take 1 tablet (500 mcg total) by mouth once a week.   vitamin C (ASCORBIC ACID) 500 MG tablet Take 500 mg by mouth daily.   No facility-administered encounter medications on file as of 02/04/2024.    Allergies (verified) Other and Shellfish allergy    History: Past Medical History:  Diagnosis Date   Allergy     Anemia    Arthritis    Blood transfusion without reported diagnosis    Diverticulitis  Family hx colonic polyps    Heart murmur    has Rheumatic fever as a child    Hiatal hernia    Hypertension    Osteoporosis 08/2009   R femur -2.5   Villous adenoma of colon    Past Surgical History:  Procedure Laterality Date   ABDOMINAL HYSTERECTOMY     COLONOSCOPY  04/2010   polyps, diverticulosis, hem, rec rpt 3 yrs [facility])   COLONOSCOPY  04/2014   mult TA, one with high grade dysplasia, mod diverticulosis, rpt 3 yrs [facility])   COLONOSCOPY  03/2019   1 TA, diverticulosis, no f/u planned  [facility])   DEXA  08/2009   femur -2.5   DEXA  04/2015   T -3 hips, -1.8 spine   LAPAROSCOPIC SIGMOID COLECTOMY  1999?   villous adenoma   PARTIAL HYSTERECTOMY     ovaries remain   TOE SURGERY     TUBAL LIGATION     Family History  Problem Relation Age of Onset   Arthritis Mother    Arthritis Father    Heart disease Father    Hypertension Father    Allergic rhinitis Daughter    Allergic rhinitis Son    Prostate cancer Son    Colon cancer Neg Hx    Esophageal cancer Neg Hx    Rectal cancer Neg Hx    Stomach cancer Neg Hx    Colon polyps Neg Hx    Social History   Socioeconomic History   Marital status: Widowed    Spouse name: Not on file   Number of children: Not on file   Years of education: Not on file   Highest education level: Not on file  Occupational History   Not on file  Tobacco Use   Smoking status: Never    Passive exposure: Past   Smokeless tobacco: Never  Vaping Use   Vaping status: Never Used  Substance and Sexual Activity   Alcohol use: Yes    Alcohol/week: 2.0 standard drinks of alcohol    Types: 2 Shots of liquor per week    Comment: 2 drinks of vodka daily. / a 5th every 2 weeks   Drug use: Never   Sexual activity: Not Currently  Other Topics Concern   Not on file  Social History Narrative   Married [phone], widowed   1 son, 2 daughters, 1 son deceased   5 grandchildren   Lives alone; I- ADLs   Working-fulltime [employer]         Social Drivers of Health   Financial Resource Strain: Low Risk  (02/04/2024)   Overall Financial Resource Strain (CARDIA)    Difficulty of Paying Living Expenses: Not hard at all  Food Insecurity: No Food Insecurity (02/04/2024)   Hunger Vital Sign    Worried About Running Out of Food in the Last Year: Never true    Ran Out of Food in the Last Year: Never true  Transportation Needs: No Transportation Needs (02/04/2024)   PRAPARE - Administrator, Civil Service (Medical): No    Lack of  Transportation (Non-Medical): No  Physical Activity: Inactive (02/04/2024)   Exercise Vital Sign    Days of Exercise per Week: 0 days    Minutes of Exercise per Session: 0 min  Stress: No Stress Concern Present (02/04/2024)   [employer] of Occupational Health - Occupational Stress Questionnaire    Feeling of Stress: Not at all  Social Connections: Moderately Isolated (02/04/2024)   Social  Connection and Isolation Panel    Frequency of Communication with Friends and Family: More than three times a week    Frequency of Social Gatherings with Friends and Family: More than three times a week    Attends Religious Services: 1 to 4 times per year    Active Member of [employer] or Organizations: No    Attends Banker Meetings: Never    Marital Status: Widowed    Tobacco Counseling Counseling given: Not Answered    Clinical Intake:  Pre-visit preparation completed: Yes  Pain : No/denies pain     BMI - recorded: 18.06 Nutritional Status: BMI <19  Underweight Nutritional Risks: None Diabetes: No  Lab Results  Component Value Date   HGBA1C 5.3 02/12/2021   HGBA1C 5.8 08/07/2020     How often do you need to have someone help you when you read instructions, pamphlets, or other written materials from your doctor or pharmacy?: 1 - Never  Interpreter Needed?: No  Comments: lives alone Information entered by :: B.Keyah Blizard,LPN   Activities of Daily Living      02/04/2024    3:58 PM  In your present state of health, do you have any difficulty performing the following activities:  Hearing? 0  Vision? 1  Difficulty concentrating or making decisions? 0  Walking or climbing stairs? 0  Dressing or bathing? 0  Doing errands, shopping? 0  Preparing Food and eating ? N  Using the Toilet? N  In the past six months, have you accidently leaked urine? N  Do you have problems with loss of bowel control? N  Managing your Medications? N  Managing your Finances? N   Housekeeping or managing your Housekeeping? N    Patient Care Team: Rilla Baller, MD as PCP - General (Family Medicine)  I have updated your Care Teams any recent Medical Services you may have received from other providers in the past year.     Assessment:   This is a routine wellness examination for Margaret Nguyen.  Hearing/Vision screen Hearing Screening - Comments:: Pt says her hearing is alright she thinks Vision Screening - Comments:: Pt says her vision is not as clear and &quot;scratchy like&quot; needs a eye dr. Referral to [facility]   Goals Addressed               This Visit's Progress     No current falls (pt-stated)   On track     02/04/24      Patient Stated   On track     02/04/24-I will continue to take medications as prescribed.       COMPLETED: Patient Stated        01/29/2021, I will maintain and continue medications as prescribed.       COMPLETED: Patient Stated        Continue to take medications as prescribed.       Depression Screen     02/04/2024    3:52 PM 02/02/2023    3:32 PM 08/28/2022    9:09 AM 01/30/2022    9:44 AM 01/29/2021    9:05 AM 09/09/2020    1:44 PM 02/02/2020    9:34 AM  PHQ 2/9 Scores  PHQ - 2 Score 0 0 0 0 0 0 2  PHQ- 9 Score     0  13    Fall Risk     02/04/2024    3:48 PM 02/02/2023    3:35 PM 08/28/2022    9:09 AM 01/30/2022  9:49 AM 01/29/2021    9:05 AM  Fall Risk   Falls in the past year? 0 1 1 1 1   Number falls in past yr: 0 0 0 0 1  Comment  blacked out     Injury with Fall? 0 0 1 0 0  Comment    Followed by PCP   Risk for fall due to : Impaired mobility;Impaired balance/gait History of fall(s);Impaired balance/gait;Other (Comment)  History of fall(s) Impaired balance/gait;Medication side effect  Risk for fall due to: Comment  has blackouts     Follow up Education provided;Falls prevention discussed Falls evaluation completed;Education provided;Falls prevention discussed  Falls prevention discussed  Falls  evaluation completed;Falls prevention discussed      Data saved with a previous flowsheet row definition    MEDICARE RISK AT HOME:  Medicare Risk at Home Any stairs in or around the home?: Yes If so, are there any without handrails?: Yes Home free of loose throw rugs in walkways, pet beds, electrical cords, etc?: Yes Adequate lighting in your home to reduce risk of falls?: Yes Life alert?: Yes Use of a cane, walker or w/c?: Yes Grab bars in the bathroom?: Yes Shower chair or bench in shower?: Yes Elevated toilet seat or a handicapped toilet?: Yes  TIMED UP AND GO:  Was the test performed?  No  Cognitive Function: 6CIT completed    01/29/2021    9:08 AM 01/12/2018   11:38 AM  MMSE - Mini Mental State Exam  Orientation to time 5 5  Orientation to Place 5 5  Registration 3 3  Attention/ Calculation 5 0  Recall 2 2  Recall-comments  unable to recall 1 of 3 words  Language- name 2 objects  0  Language- repeat 1 1  Language- follow 3 step command  3  Language- read & follow direction  0  Write a sentence  0  Copy design  0  Total score  19        02/04/2024    4:10 PM 02/02/2023    3:39 PM 01/30/2022    9:52 AM  6CIT Screen  What Year? 0 points 0 points 0 points  What month? 0 points 0 points 0 points  What time? 0 points 0 points 0 points  Count back from 20 0 points 0 points 0 points  Months in reverse 0 points 2 points 0 points  Repeat phrase 0 points 6 points 0 points  Total Score 0 points 8 points 0 points    Immunizations Immunization History  Administered Date(s) Administered   Fluad Quad(high Dose 65+) 05/15/2019, 05/31/2020, 03/21/2022   Influenza Split 04/19/2012   Influenza Whole 06/13/2009   Influenza, High Dose Seasonal PF 05/07/2021, 03/24/2023   Influenza, Seasonal, Injecte, Preservative Fre 06/12/2016   Influenza,inj,Quad PF,6+ Mos 07/02/2014, 03/08/2015, 05/11/2017, 03/30/2018   Influenza-Unspecified 05/13/2013   PFIZER(Purple Top)SARS-COV-2  Vaccination 09/25/2019, 10/17/2019, 06/05/2020   Pfizer Covid-19 Vaccine Bivalent Booster 65yrs & up 05/15/2021   Pfizer(Comirnaty)Fall Seasonal Vaccine 12 years and older 04/19/2023   Pneumococcal Conjugate-13 09/09/2015   Pneumococcal Polysaccharide-23 02/24/2006, 02/12/2021   Tdap 01/22/2011    Screening Tests Health Maintenance  Topic Date Due   Zoster Vaccines- Shingrix (1 of 2) Never done   DTaP/Tdap/Td (2 - Td or Tdap) 01/21/2021   MAMMOGRAM  03/11/2022   COVID-19 Vaccine (6 - 2024-25 season) 10/18/2023   INFLUENZA VACCINE  02/11/2024   Medicare Annual Wellness (AWV)  02/03/2025   Pneumococcal Vaccine: 50+ Years  Completed  DEXA SCAN  Completed   Hepatitis B Vaccines  Aged Out   HPV VACCINES  Aged Out   Meningococcal B Vaccine  Aged Out   Colonoscopy  Discontinued   Hepatitis C Screening  Discontinued    Health Maintenance  Health Maintenance Due  Topic Date Due   Zoster Vaccines- Shingrix (1 of 2) Never done   DTaP/Tdap/Td (2 - Td or Tdap) 01/21/2021   MAMMOGRAM  03/11/2022   COVID-19 Vaccine (6 - 2024-25 season) 10/18/2023   Health Maintenance Items Addressed: Referral for eye exam placed per request of pt to [facility]  Additional Screening:  Vision Screening: Recommended annual ophthalmology exams for early detection of glaucoma and other disorders of the eye. Would you like a referral to an eye doctor? No    Dental Screening: Recommended annual dental exams for proper oral hygiene  Community Resource Referral / Chronic Care Management: CRR required this visit?  No   CCM required this visit?  Appt scheduled with PCP   Plan:    I have personally reviewed and noted the following in the patient's chart:   Medical and social history Use of alcohol, tobacco or illicit drugs  Current medications and supplements including opioid prescriptions. Patient is not currently taking opioid prescriptions. Functional ability and status Nutritional  status Physical activity Advanced directives List of other physicians Hospitalizations, surgeries, and ER visits in previous 12 months Vitals Screenings to include cognitive, depression, and falls Referrals and appointments  In addition, I have reviewed and discussed with patient certain preventive protocols, quality metrics, and best practice recommendations. A written personalized care plan for preventive services as well as general preventive health recommendations were provided to patient.   Erminio LITTIE Saris, LPN   [id number]   After Visit Summary: (Declined) Due to this being a telephonic visit, with patients personalized plan was offered to patient but patient Declined AVS at this time   Notes: Nothing significant to report at this time.

## 2024-04-16 ENCOUNTER — Other Ambulatory Visit: Payer: Self-pay | Admitting: Family Medicine

## 2024-04-16 DIAGNOSIS — E785 Hyperlipidemia, unspecified: Secondary | ICD-10-CM

## 2024-04-16 DIAGNOSIS — M810 Age-related osteoporosis without current pathological fracture: Secondary | ICD-10-CM

## 2024-04-16 DIAGNOSIS — E538 Deficiency of other specified B group vitamins: Secondary | ICD-10-CM

## 2024-04-16 DIAGNOSIS — D631 Anemia in chronic kidney disease: Secondary | ICD-10-CM

## 2024-04-16 DIAGNOSIS — D72819 Decreased white blood cell count, unspecified: Secondary | ICD-10-CM

## 2024-04-16 DIAGNOSIS — E038 Other specified hypothyroidism: Secondary | ICD-10-CM

## 2024-04-16 DIAGNOSIS — N1832 Chronic kidney disease, stage 3b: Secondary | ICD-10-CM

## 2024-04-18 ENCOUNTER — Other Ambulatory Visit

## 2024-04-18 DIAGNOSIS — N1832 Chronic kidney disease, stage 3b: Secondary | ICD-10-CM | POA: Diagnosis not present

## 2024-04-18 DIAGNOSIS — D631 Anemia in chronic kidney disease: Secondary | ICD-10-CM

## 2024-04-18 DIAGNOSIS — E538 Deficiency of other specified B group vitamins: Secondary | ICD-10-CM | POA: Diagnosis not present

## 2024-04-18 DIAGNOSIS — D72819 Decreased white blood cell count, unspecified: Secondary | ICD-10-CM

## 2024-04-18 DIAGNOSIS — M810 Age-related osteoporosis without current pathological fracture: Secondary | ICD-10-CM | POA: Diagnosis not present

## 2024-04-18 DIAGNOSIS — E785 Hyperlipidemia, unspecified: Secondary | ICD-10-CM | POA: Diagnosis not present

## 2024-04-18 DIAGNOSIS — E038 Other specified hypothyroidism: Secondary | ICD-10-CM | POA: Diagnosis not present

## 2024-04-18 LAB — CBC WITH DIFFERENTIAL/PLATELET
Basophils Absolute: 0.1 K/uL (ref 0.0–0.1)
Basophils Relative: 1 % (ref 0.0–3.0)
Eosinophils Absolute: 0 K/uL (ref 0.0–0.7)
Eosinophils Relative: 0.4 % (ref 0.0–5.0)
HCT: 33.8 % — ABNORMAL LOW (ref 36.0–46.0)
Hemoglobin: 11 g/dL — ABNORMAL LOW (ref 12.0–15.0)
Lymphocytes Relative: 14.5 % (ref 12.0–46.0)
Lymphs Abs: 0.8 K/uL (ref 0.7–4.0)
MCHC: 32.5 g/dL (ref 30.0–36.0)
MCV: 88.8 fl (ref 78.0–100.0)
Monocytes Absolute: 0.9 K/uL (ref 0.1–1.0)
Monocytes Relative: 17.7 % — ABNORMAL HIGH (ref 3.0–12.0)
Neutro Abs: 3.5 K/uL (ref 1.4–7.7)
Neutrophils Relative %: 66.4 % (ref 43.0–77.0)
Platelets: 212 K/uL (ref 150.0–400.0)
RBC: 3.81 Mil/uL — ABNORMAL LOW (ref 3.87–5.11)
RDW: 14.7 % (ref 11.5–15.5)
WBC: 5.3 K/uL (ref 4.0–10.5)

## 2024-04-18 LAB — T4, FREE: Free T4: 0.71 ng/dL (ref 0.60–1.60)

## 2024-04-18 LAB — LIPID PANEL
Cholesterol: 171 mg/dL (ref 0–200)
HDL: 80.3 mg/dL (ref >39.00)
LDL Cholesterol: 53 mg/dL (ref 0–99)
NonHDL: 91.01
Total CHOL/HDL Ratio: 2
Triglycerides: 192 mg/dL — ABNORMAL HIGH (ref 0.0–149.0)
VLDL: 38.4 mg/dL (ref 0.0–40.0)

## 2024-04-18 LAB — COMPREHENSIVE METABOLIC PANEL WITH GFR
ALT: 14 U/L (ref 0–35)
AST: 24 U/L (ref 0–37)
Albumin: 4.4 g/dL (ref 3.5–5.2)
Alkaline Phosphatase: 49 U/L (ref 39–117)
BUN: 33 mg/dL — ABNORMAL HIGH (ref 6–23)
CO2: 29 meq/L (ref 19–32)
Calcium: 9.8 mg/dL (ref 8.4–10.5)
Chloride: 101 meq/L (ref 96–112)
Creatinine, Ser: 1.19 mg/dL (ref 0.40–1.20)
GFR: 42.38 mL/min — ABNORMAL LOW (ref >60.00)
Glucose, Bld: 132 mg/dL — ABNORMAL HIGH (ref 70–99)
Potassium: 3.9 meq/L (ref 3.5–5.1)
Sodium: 141 meq/L (ref 135–145)
Total Bilirubin: 0.6 mg/dL (ref 0.2–1.2)
Total Protein: 7.6 g/dL (ref 6.0–8.3)

## 2024-04-18 LAB — PHOSPHORUS: Phosphorus: 2.9 mg/dL (ref 2.3–4.6)

## 2024-04-18 LAB — VITAMIN D 25 HYDROXY (VIT D DEFICIENCY, FRACTURES): VITD: 80.37 ng/mL (ref 30.00–100.00)

## 2024-04-18 LAB — TSH: TSH: 5.09 u[IU]/mL (ref 0.35–5.50)

## 2024-04-18 LAB — VITAMIN B12: Vitamin B-12: 899 pg/mL (ref 211–911)

## 2024-04-19 LAB — MICROALBUMIN / CREATININE URINE RATIO
Creatinine,U: 125.5 mg/dL
Microalb Creat Ratio: 18.6 mg/g (ref 0.0–30.0)
Microalb, Ur: 2.3 mg/dL — ABNORMAL HIGH (ref 0.0–1.9)

## 2024-04-19 LAB — PARATHYROID HORMONE, INTACT (NO CA): PTH: 34 pg/mL (ref 16–77)

## 2024-04-24 ENCOUNTER — Ambulatory Visit: Payer: Self-pay | Admitting: Family Medicine

## 2024-04-25 ENCOUNTER — Encounter: Payer: Self-pay | Admitting: Family Medicine

## 2024-04-25 ENCOUNTER — Ambulatory Visit: Admitting: Family Medicine

## 2024-04-25 VITALS — BP 120/82 | HR 96 | Temp 98.0°F | Ht 60.5 in | Wt 89.1 lb

## 2024-04-25 DIAGNOSIS — M79672 Pain in left foot: Secondary | ICD-10-CM

## 2024-04-25 DIAGNOSIS — R7401 Elevation of levels of liver transaminase levels: Secondary | ICD-10-CM | POA: Diagnosis not present

## 2024-04-25 DIAGNOSIS — E038 Other specified hypothyroidism: Secondary | ICD-10-CM | POA: Diagnosis not present

## 2024-04-25 DIAGNOSIS — M810 Age-related osteoporosis without current pathological fracture: Secondary | ICD-10-CM

## 2024-04-25 DIAGNOSIS — I10 Essential (primary) hypertension: Secondary | ICD-10-CM | POA: Diagnosis not present

## 2024-04-25 DIAGNOSIS — D72819 Decreased white blood cell count, unspecified: Secondary | ICD-10-CM

## 2024-04-25 DIAGNOSIS — Z0000 Encounter for general adult medical examination without abnormal findings: Secondary | ICD-10-CM

## 2024-04-25 DIAGNOSIS — E785 Hyperlipidemia, unspecified: Secondary | ICD-10-CM | POA: Diagnosis not present

## 2024-04-25 DIAGNOSIS — N1832 Chronic kidney disease, stage 3b: Secondary | ICD-10-CM

## 2024-04-25 DIAGNOSIS — D631 Anemia in chronic kidney disease: Secondary | ICD-10-CM | POA: Diagnosis not present

## 2024-04-25 DIAGNOSIS — M79671 Pain in right foot: Secondary | ICD-10-CM | POA: Diagnosis not present

## 2024-04-25 DIAGNOSIS — R636 Underweight: Secondary | ICD-10-CM

## 2024-04-25 DIAGNOSIS — F1090 Alcohol use, unspecified, uncomplicated: Secondary | ICD-10-CM

## 2024-04-25 DIAGNOSIS — E538 Deficiency of other specified B group vitamins: Secondary | ICD-10-CM | POA: Diagnosis not present

## 2024-04-25 DIAGNOSIS — Z23 Encounter for immunization: Secondary | ICD-10-CM

## 2024-04-25 DIAGNOSIS — H547 Unspecified visual loss: Secondary | ICD-10-CM

## 2024-04-25 DIAGNOSIS — Z91013 Allergy to seafood: Secondary | ICD-10-CM

## 2024-04-25 DIAGNOSIS — Z7189 Other specified counseling: Secondary | ICD-10-CM

## 2024-04-25 MED ORDER — LOSARTAN POTASSIUM-HCTZ 50-12.5 MG PO TABS: 1.0000 | ORAL_TABLET | Freq: Every day | ORAL | 3 refills | Status: AC

## 2024-04-25 MED ORDER — POTASSIUM CHLORIDE ER 10 MEQ PO TBCR: 10.0000 meq | EXTENDED_RELEASE_TABLET | Freq: Every day | ORAL | 3 refills | Status: AC

## 2024-04-25 MED ORDER — COLCHICINE 0.6 MG PO TABS: 0.6000 mg | ORAL_TABLET | Freq: Every day | ORAL | 1 refills | Status: DC | PRN

## 2024-04-25 NOTE — Assessment & Plan Note (Signed)
 Previously discussed.
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
## 2024-04-25 NOTE — Assessment & Plan Note (Signed)
 Preventative protocols reviewed and updated unless pt declined. Discussed healthy diet and lifestyle.

## 2024-04-25 NOTE — Patient Instructions (Addendum)
 Flu shot today  Ok to continue Ensure supplement - and ideally get 3 meals a day, with snacks in between if you can.  Consider updated bone density scan as you're due.  If interested, check with pharmacy about new 2 shot shingles series (shingrix).   Eye screen today.  Schedule eye exam as you're due. You may call [facility] for appointment [phone]   Return in 6 months for follow up visit

## 2024-04-27 NOTE — Assessment & Plan Note (Addendum)
 5 lb weight loss since last year. BMI 17 Encouraged 3 meals a day, good protein intake.

## 2024-04-27 NOTE — Assessment & Plan Note (Signed)
 Continue once weekly replacement with good levels

## 2024-04-27 NOTE — Assessment & Plan Note (Signed)
 Chronic with GFR increased to 40s - continue to monitor.

## 2024-04-27 NOTE — Assessment & Plan Note (Signed)
 This has resolved with decreased alcohol consumption - will continue to monitor.

## 2024-04-27 NOTE — Assessment & Plan Note (Signed)
 LFTs now normal with significant decreased alcohol consumption.

## 2024-04-27 NOTE — Assessment & Plan Note (Signed)
 Avoids shellfish. Tolerating ARB.  Has epi pen.

## 2024-04-27 NOTE — Assessment & Plan Note (Signed)
 Chronic, chol levels overall stable, not on statin.  The ASCVD Risk score (Arnett DK, et al., 2019) failed to calculate for the following reasons:   The 2019 ASCVD risk score is only valid for ages 18 to 31

## 2024-04-27 NOTE — Assessment & Plan Note (Signed)
 TFTs remain stable. Continue to monitor this.

## 2024-04-27 NOTE — Assessment & Plan Note (Addendum)
 Chronic overall stable period with latest Hgb 11.  Sees heme thought CKD related. Due for f/u - last seen 04/2023.

## 2024-04-27 NOTE — Assessment & Plan Note (Addendum)
 Reviewed latest DEXA from 2022 showing T score -3.4 at [facility]. Not on bone strengthening medication - intolerance to fosamax , reclast , and prolia was unaffordable. Continue regular calcium  and vitamin D  supplementation, encouraged regular weight bearing exercises.  She has declined repeat DEXA.

## 2024-04-27 NOTE — Assessment & Plan Note (Signed)
 Significantly cut down in 2024 - now  only drinking socially  - congratulated!

## 2024-04-27 NOTE — Assessment & Plan Note (Signed)
 Chronic, stable. Continue hyzaar. Potassium levels stable with daily potassium replacement.

## 2024-05-04 DIAGNOSIS — H35372 Puckering of macula, left eye: Secondary | ICD-10-CM | POA: Diagnosis not present

## 2024-05-04 DIAGNOSIS — H268 Other specified cataract: Secondary | ICD-10-CM | POA: Diagnosis not present

## 2024-05-08 DIAGNOSIS — H2513 Age-related nuclear cataract, bilateral: Secondary | ICD-10-CM | POA: Diagnosis not present

## 2024-05-08 DIAGNOSIS — H35372 Puckering of macula, left eye: Secondary | ICD-10-CM | POA: Diagnosis not present

## 2024-05-08 DIAGNOSIS — H43813 Vitreous degeneration, bilateral: Secondary | ICD-10-CM | POA: Diagnosis not present

## 2024-05-23 ENCOUNTER — Other Ambulatory Visit: Payer: Self-pay | Admitting: Family Medicine

## 2024-05-23 DIAGNOSIS — M79671 Pain in right foot: Secondary | ICD-10-CM

## 2024-05-26 ENCOUNTER — Encounter: Payer: Self-pay | Admitting: Ophthalmology

## 2024-05-26 NOTE — Anesthesia Preprocedure Evaluation (Addendum)
 Anesthesia Evaluation  Patient identified by MRN, date of birth, ID band Patient awake    Reviewed: Allergy  & Precautions, H&P , NPO status , Patient's Chart, lab work & pertinent test results  Airway Mallampati: III  TM Distance: <3 FB Neck ROM: Full  Mouth opening: Limited Mouth Opening  Dental no notable dental hx. (+) Upper Dentures, Partial Lower   Pulmonary neg pulmonary ROS, former smoker   Pulmonary exam normal breath sounds clear to auscultation       Cardiovascular hypertension, negative cardio ROS Normal cardiovascular exam+ Valvular Problems/Murmurs  Rhythm:Regular Rate:Normal     Neuro/Psych  Neuromuscular disease negative neurological ROS  negative psych ROS   GI/Hepatic negative GI ROS, Neg liver ROS, hiatal hernia,,,  Endo/Other  negative endocrine ROSHypothyroidism    Renal/GU Renal diseasenegative Renal ROS  negative genitourinary   Musculoskeletal negative musculoskeletal ROS (+) Arthritis ,    Abdominal   Peds negative pediatric ROS (+)  Hematology negative hematology ROS (+) Blood dyscrasia, anemia   Anesthesia Other Findings Office note: Anemia is likely secondary to chronic kidney disease and alcohol-induced bone marrow suppression.  Villous adenoma of colon  Hiatal hernia Family hx colonic polyps  Hypertension Diverticulitis  Arthritis Osteoporosis  Allergy  Heart murmur Blood transfusion without reported diagnosis Anemia  Chronic kidney disease, stage 3b (HCC) Anemia of chronic disease  Habitual alcohol use Subclinical hypothyroidism  Fatty liver Peripheral neuropathy  Chronic alcoholic liver disease    Reproductive/Obstetrics negative OB ROS                              Anesthesia Physical Anesthesia Plan  ASA: 3  Anesthesia Plan: MAC   Post-op Pain Management:    Induction: Intravenous  PONV Risk Score and Plan:   Airway Management  Planned: Natural Airway and Nasal Cannula  Additional Equipment:   Intra-op Plan:   Post-operative Plan:   Informed Consent: I have reviewed the patients History and Physical, chart, labs and discussed the procedure including the risks, benefits and alternatives for the proposed anesthesia with the patient or authorized representative who has indicated his/her understanding and acceptance.     Dental Advisory Given  Plan Discussed with: Anesthesiologist, CRNA and Surgeon  Anesthesia Plan Comments: (Patient consented for risks of anesthesia including but not limited to:  - adverse reactions to medications - damage to eyes, teeth, lips or other oral mucosa - nerve damage due to positioning  - sore throat or hoarseness - Damage to heart, brain, nerves, lungs, other parts of body or loss of life  Patient voiced understanding and assent.)        Anesthesia Quick Evaluation

## 2024-05-26 NOTE — Discharge Instructions (Signed)

## 2024-05-30 ENCOUNTER — Encounter
Admission: RE | Disposition: A | Discharge status: Home / Self Care | Payer: Self-pay | Source: Home / Self Care | Attending: Ophthalmology

## 2024-05-30 ENCOUNTER — Other Ambulatory Visit: Payer: Self-pay

## 2024-05-30 ENCOUNTER — Ambulatory Visit: Payer: Self-pay | Admitting: Anesthesiology

## 2024-05-30 ENCOUNTER — Ambulatory Visit
Admission: RE | Admit: 2024-05-30 | Discharge: 2024-05-30 | Disposition: A | Discharge status: Home / Self Care | Attending: Ophthalmology | Admitting: Ophthalmology

## 2024-05-30 ENCOUNTER — Encounter: Payer: Self-pay | Admitting: Ophthalmology

## 2024-05-30 DIAGNOSIS — M810 Age-related osteoporosis without current pathological fracture: Secondary | ICD-10-CM | POA: Insufficient documentation

## 2024-05-30 DIAGNOSIS — D631 Anemia in chronic kidney disease: Secondary | ICD-10-CM | POA: Insufficient documentation

## 2024-05-30 DIAGNOSIS — M1990 Unspecified osteoarthritis, unspecified site: Secondary | ICD-10-CM | POA: Insufficient documentation

## 2024-05-30 DIAGNOSIS — D649 Anemia, unspecified: Secondary | ICD-10-CM | POA: Diagnosis not present

## 2024-05-30 DIAGNOSIS — Z79899 Other long term (current) drug therapy: Secondary | ICD-10-CM | POA: Diagnosis not present

## 2024-05-30 DIAGNOSIS — I129 Hypertensive chronic kidney disease with stage 1 through stage 4 chronic kidney disease, or unspecified chronic kidney disease: Secondary | ICD-10-CM | POA: Diagnosis not present

## 2024-05-30 DIAGNOSIS — K449 Diaphragmatic hernia without obstruction or gangrene: Secondary | ICD-10-CM | POA: Insufficient documentation

## 2024-05-30 DIAGNOSIS — N1832 Chronic kidney disease, stage 3b: Secondary | ICD-10-CM | POA: Insufficient documentation

## 2024-05-30 DIAGNOSIS — H2512 Age-related nuclear cataract, left eye: Secondary | ICD-10-CM | POA: Diagnosis present

## 2024-05-30 DIAGNOSIS — Z87891 Personal history of nicotine dependence: Secondary | ICD-10-CM | POA: Insufficient documentation

## 2024-05-30 HISTORY — PX: CATARACT EXTRACTION W/PHACO: SHX586

## 2024-05-30 HISTORY — DX: Anemia in other chronic diseases classified elsewhere: D63.8

## 2024-05-30 HISTORY — DX: Fatty (change of) liver, not elsewhere classified: K76.0

## 2024-05-30 HISTORY — DX: Chronic kidney disease, stage 3b: N18.32

## 2024-05-30 HISTORY — DX: Other specified hypothyroidism: E03.8

## 2024-05-30 HISTORY — DX: Alcohol use, unspecified, uncomplicated: F10.90

## 2024-05-30 SURGERY — PHACOEMULSIFICATION, CATARACT, WITH IOL INSERTION
Anesthesia: Monitor Anesthesia Care | Laterality: Left

## 2024-05-30 MED ORDER — LACTATED RINGERS IV SOLN: INTRAVENOUS | Status: DC

## 2024-05-30 MED ORDER — FENTANYL CITRATE (PF) 100 MCG/2ML IJ SOLN
INTRAMUSCULAR | Status: DC | PRN
  Administered 2024-05-30: 50 ug via INTRAVENOUS

## 2024-05-30 MED ORDER — TETRACAINE HCL 0.5 % OP SOLN
1.0000 [drp] | OPHTHALMIC | Status: DC | PRN
  Administered 2024-05-30 (×3): 1 [drp] via OPHTHALMIC

## 2024-05-30 MED ORDER — CYCLOPENTOLATE HCL 2 % OP SOLN
OPHTHALMIC | Status: AC
  Filled 2024-05-30: qty 2

## 2024-05-30 MED ORDER — PHENYLEPHRINE HCL 10 % OP SOLN
OPHTHALMIC | Status: AC
  Filled 2024-05-30: qty 5

## 2024-05-30 MED ORDER — SIGHTPATH DOSE#1 NA CHONDROIT SULF-NA HYALURON 40-17 MG/ML IO SOLN
INTRAOCULAR | Status: DC | PRN
  Administered 2024-05-30: 1 mL via INTRAOCULAR

## 2024-05-30 MED ORDER — MOXIFLOXACIN HCL 0.5 % OP SOLN
OPHTHALMIC | Status: DC | PRN
  Administered 2024-05-30: .2 mL via OPHTHALMIC

## 2024-05-30 MED ORDER — TETRACAINE HCL 0.5 % OP SOLN
OPHTHALMIC | Status: AC
  Filled 2024-05-30: qty 4

## 2024-05-30 MED ORDER — LIDOCAINE HCL (PF) 2 % IJ SOLN
INTRAOCULAR | Status: DC | PRN
  Administered 2024-05-30: 2 mL

## 2024-05-30 MED ORDER — SIGHTPATH DOSE#1 BSS IO SOLN
INTRAOCULAR | Status: DC | PRN
  Administered 2024-05-30: 87 mL via OPHTHALMIC

## 2024-05-30 MED ORDER — FENTANYL CITRATE (PF) 100 MCG/2ML IJ SOLN
INTRAMUSCULAR | Status: AC
  Filled 2024-05-30: qty 2

## 2024-05-30 MED ORDER — BRIMONIDINE TARTRATE-TIMOLOL 0.2-0.5 % OP SOLN
OPHTHALMIC | Status: DC | PRN
  Administered 2024-05-30: 1 [drp] via OPHTHALMIC

## 2024-05-30 MED ORDER — SIGHTPATH DOSE#1 BSS IO SOLN
INTRAOCULAR | Status: DC | PRN
  Administered 2024-05-30: 15 mL via INTRAOCULAR

## 2024-05-30 MED ORDER — PHENYLEPHRINE HCL 10 % OP SOLN
1.0000 [drp] | OPHTHALMIC | Status: AC
  Administered 2024-05-30 (×3): 1 [drp] via OPHTHALMIC

## 2024-05-30 MED ORDER — CYCLOPENTOLATE HCL 2 % OP SOLN
1.0000 [drp] | OPHTHALMIC | Status: AC
Start: 2024-05-30 — End: 2024-05-30
  Administered 2024-05-30 (×3): 1 [drp] via OPHTHALMIC

## 2024-05-30 SURGICAL SUPPLY — 11 items
CANNULA ANT/CHMB 27G (MISCELLANEOUS) ×1 IMPLANT
CYSTOTOME ANGL RVRS SHRT 25G (CUTTER) ×1 IMPLANT
FEE CATARACT SUITE SIGHTPATH (MISCELLANEOUS) ×1 IMPLANT
GLOVE BIOGEL PI IND STRL 8 (GLOVE) ×1 IMPLANT
GLOVE SURG LX STRL 8.0 MICRO (GLOVE) ×1 IMPLANT
GLOVE SURG SYN 6.5 PF PI BL (GLOVE) ×1 IMPLANT
LENS IOL TECNIS EYHANCE 24.5 (Intraocular Lens) IMPLANT
NDL FILTER BLUNT 18X1 1/2 (NEEDLE) ×1 IMPLANT
NEEDLE FILTER BLUNT 18X1 1/2 (NEEDLE) ×1 IMPLANT
RING MALYGIN (MISCELLANEOUS) IMPLANT
SYR 3ML LL SCALE MARK (SYRINGE) ×1 IMPLANT

## 2024-05-30 NOTE — Anesthesia Postprocedure Evaluation (Signed)
 Anesthesia Post Note  Patient: Margaret Nguyen  Procedure(s) Performed: PHACOEMULSIFICATION, CATARACT, WITH IOL INSERTION 8.82 01:23.7 (Left)  Patient location during evaluation: PACU Anesthesia Type: MAC Level of consciousness: awake and alert Pain management: pain level controlled Vital Signs Assessment: post-procedure vital signs reviewed and stable Respiratory status: spontaneous breathing, nonlabored ventilation, respiratory function stable and patient connected to nasal cannula oxygen Cardiovascular status: stable and blood pressure returned to baseline Postop Assessment: no apparent nausea or vomiting Anesthetic complications: no   No notable events documented.   Last Vitals:  Vitals:   05/30/24 1024 05/30/24 1030  BP: (!) 161/79 (!) 140/84  Pulse: 75 84  Resp: 16 17  Temp: 36.8 C   SpO2: 100% 100%    Last Pain:  Vitals:   05/30/24 1030  TempSrc:   PainSc: 0-No pain                 Margaret Nguyen

## 2024-05-30 NOTE — Transfer of Care (Signed)
 Immediate Anesthesia Transfer of Care Note  Patient: Deneka Greenwalt  Procedure(s) Performed: PHACOEMULSIFICATION, CATARACT, WITH IOL INSERTION 8.82 01:23.7 (Left)  Patient Location: PACU  Anesthesia Type: MAC  Level of Consciousness: awake, alert  and patient cooperative  Airway and Oxygen Therapy: Patient Spontanous Breathing and Patient connected to supplemental oxygen  Post-op Assessment: Post-op Vital signs reviewed, Patient's Cardiovascular Status Stable, Respiratory Function Stable, Patent Airway and No signs of Nausea or vomiting  Post-op Vital Signs: Reviewed and stable  Complications: No notable events documented.

## 2024-05-30 NOTE — Op Note (Signed)
 PREOPERATIVE DIAGNOSIS:  Nuclear sclerotic cataract of the left eye.   POSTOPERATIVE DIAGNOSIS:  Nuclear sclerotic cataract of the left eye.   OPERATIVE PROCEDURE:ORPROCALL@   SURGEON:  Elsie Carmine, MD.   ANESTHESIA:  Anesthesiologist: Ola Donny BROCKS, MD CRNA: Myra Lawless, CRNA  1.      Managed anesthesia care. 2.     0.40ml of Shugarcaine was instilled following the paracentesis   COMPLICATIONS: Viscoelastic was used to raise the pupil margin.  A  Malyugin ring was placed as the pupil would not achieve sufficient pharmacologic dilation to undergo cataract extraction safely.( The ring was removed atraumatically following insertion of the IOL.)    TECHNIQUE:   Stop and chop   DESCRIPTION OF PROCEDURE:  The patient was examined and consented in the preoperative holding area where the aforementioned topical anesthesia was applied to the left eye and then brought back to the Operating Room where the left eye was prepped and draped in the usual sterile ophthalmic fashion and a lid speculum was placed. A paracentesis was created with the side port blade and the anterior chamber was filled with viscoelastic. A near clear corneal incision was performed with the steel keratome. A continuous curvilinear capsulorrhexis was performed with a cystotome followed by the capsulorrhexis forceps. Hydrodissection and hydrodelineation were carried out with BSS on a blunt cannula. The lens was removed in a stop and chop  technique and the remaining cortical material was removed with the irrigation-aspiration handpiece. The capsular bag was inflated with viscoelastic and the intraocular lens was placed in the capsular bag without complication. The remaining viscoelastic was removed from the eye with the irrigation-aspiration handpiece. The wounds were hydrated. The anterior chamber was flushed with BSS and the eye was inflated to physiologic pressure. 0.19ml Vigamox was placed in the anterior chamber. The  wounds were found to be water tight. The eye was dressed with Combigan. The patient was given protective glasses to wear throughout the day and a shield with which to sleep tonight. The patient was also given drops with which to begin a drop regimen today and will follow-up with me in one day. Implant Name Type Inv. Item Serial No. Manufacturer Lot No. LRB No. Used Action  LENS IOL TECNIS EYHANCE 24.5 - [id number] Intraocular Lens LENS IOL TECNIS EYHANCE 24.5 [id number] [employer]  Left 1 Implanted    Procedure(s): PHACOEMULSIFICATION, CATARACT, WITH IOL INSERTION 8.82 01:23.7 (Left)  Electronically signed: Elsie Carmine 05/30/2024 10:21 AM

## 2024-05-30 NOTE — H&P (Signed)
 [facility]   Primary Care Physician:  Rilla Baller, MD Ophthalmologist: Dr. Elsie Carmine  Pre-Procedure History & Physical: HPI:  Margaret Nguyen is a 83 y.o. female here for cataract surgery.   Past Medical History:  Diagnosis Date   Allergy     Anemia    Anemia of chronic disease    Arthritis    Blood transfusion without reported diagnosis    Chronic alcoholic liver disease 05/06/2023   attempting to cut back on ETOH   Chronic kidney disease, stage 3b (HCC)    Diverticulitis    Family hx colonic polyps    Fatty liver    Habitual alcohol use    2 shots of vodka daily, a fifth a week   Heart murmur    has Rheumatic fever as a child    Hiatal hernia    Hypertension    Osteoporosis 08/2009   R femur -2.5   Peripheral neuropathy 08/28/2022   bilateral symmetrical length dependent sensorineuropathy -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   villous adenoma   PARTIAL HYSTERECTOMY     ovaries remain   TOE SURGERY     TUBAL LIGATION      Prior to Admission medications   Medication Sig Start Date End Date Taking? Authorizing Provider  Calcium  Carb-Cholecalciferol 500-400 MG-UNIT TABS Take by mouth.   Yes [provider]  Cholecalciferol (VITAMIN D3) 25 MCG (1000 UT) CAPS Take by mouth.   Yes [provider]  losartan -hydrochlorothiazide  (HYZAAR) 50-12.5 MG tablet Take 1 tablet by mouth daily. For high blood pressure 04/25/24  Yes Rilla Baller, MD   Multiple Vitamins-Minerals (CENTRUM PO) Take by mouth.   Yes [provider]  potassium chloride  (KLOR-CON ) 10 MEQ tablet Take 1 tablet (10 mEq total) by mouth daily. 04/25/24  Yes Rilla Baller, MD  vitamin B-12 (CYANOCOBALAMIN ) 500 MCG tablet Take 1 tablet (500 mcg total) by mouth once a week. 02/26/23  Yes Rilla Baller, MD  vitamin C (ASCORBIC ACID) 500 MG tablet Take 500 mg by mouth daily.   Yes [provider]  acetaminophen  (TYLENOL ) 500 MG tablet Take 1 tablet (500 mg total) by mouth every 6 (six) hours as needed. 05/11/17   Rilla Baller, MD  colchicine  0.6 MG tablet TAKE 1 TABLET (0.6 MG TOTAL) BY MOUTH DAILY AS NEEDED (GOUT FLARE). 05/24/24   Rilla Baller, MD    Allergies as of 05/12/2024 - Review Complete 04/25/2024  Allergen Reaction Noted   Other Swelling 04/19/2014   Shellfish allergy  Swelling 02/25/2022    Family History  Problem Relation Age of Onset   Arthritis Mother    Arthritis Father    Heart disease Father    Hypertension Father    Allergic rhinitis Daughter    Allergic rhinitis Son    Prostate cancer Son    Colon cancer Neg Hx    Esophageal cancer Neg Hx  Rectal cancer Neg Hx    Stomach cancer Neg Hx    Colon polyps Neg Hx     Social History   Socioeconomic History   Marital status: Widowed    Spouse name: Not on file   Number of children: Not on file   Years of education: Not on file   Highest education level: Not on file  Occupational History   Not on file  Tobacco Use   Smoking status: Former    Current packs/day: 0.00    Types: Cigarettes    Quit date: 07/13/1978    Years since quitting: 45.9    Passive exposure: Past   Smokeless tobacco: Never  Vaping Use   Vaping status: Never Used  Substance and Sexual Activity   Alcohol use: Yes    Alcohol/week: 1.0 standard drink of alcohol    Types: 1 Shots of liquor per week    Comment: weekly   Drug use: Never   Sexual activity: Not Currently  Other Topics  Concern   Not on file  Social History Narrative   Married [phone], widowed   1 son, 2 daughters, 1 son deceased   5 grandchildren   Lives alone; I- ADLs   Working-fulltime [employer]         Social Drivers of Health   Financial Resource Strain: Low Risk  (02/04/2024)   Overall Financial Resource Strain (CARDIA)    Difficulty of Paying Living Expenses: Not hard at all  Food Insecurity: No Food Insecurity (02/04/2024)   Hunger Vital Sign    Worried About Running Out of Food in the Last Year: Never true    Ran Out of Food in the Last Year: Never true  Transportation Needs: No Transportation Needs (02/04/2024)   PRAPARE - Administrator, Civil Service (Medical): No    Lack of Transportation (Non-Medical): No  Physical Activity: Inactive (02/04/2024)   Exercise Vital Sign    Days of Exercise per Week: 0 days    Minutes of Exercise per Session: 0 min  Stress: No Stress Concern Present (02/04/2024)   [employer] of Occupational Health - Occupational Stress Questionnaire    Feeling of Stress: Not at all  Social Connections: Moderately Isolated (02/04/2024)   Social Connection and Isolation Panel    Frequency of Communication with Friends and Family: More than three times a week    Frequency of Social Gatherings with Friends and Family: More than three times a week    Attends Religious Services: 1 to 4 times per year    Active Member of [employer] or Organizations: No    Attends Banker Meetings: Never    Marital Status: Widowed  Intimate Partner Violence: Not At Risk (02/04/2024)   Humiliation, Afraid, Rape, and Kick questionnaire    Fear of Current or Ex-Partner: No    Emotionally Abused: No    Physically Abused: No    Sexually Abused: No    Review of Systems: See HPI, otherwise negative ROS  Physical Exam: BP 139/66   Pulse 80   Temp 98 F (36.7 C) (Temporal)   Resp 18   Ht 5' 1 (1.549 m)   Wt 40.4 kg   SpO2 99%   BMI 16.82 kg/m   General:   Alert, cooperative. Head:  Normocephalic and atraumatic. Respiratory:  Normal work of breathing. Cardiovascular:  NAD  Impression/Plan: Margaret Nguyen is here for cataract surgery.  Risks, benefits, limitations, and alternatives regarding cataract surgery have been  reviewed with the patient.  Questions have been answered.  All parties agreeable.   Elsie Carmine, MD  05/30/2024, 8:30 AM

## 2024-06-06 NOTE — Anesthesia Preprocedure Evaluation (Addendum)
 Anesthesia Evaluation  Patient identified by MRN, date of birth, ID band Patient awake    Reviewed: Allergy  & Precautions, H&P , NPO status , Patient's Chart, lab work & pertinent test results  Airway Mallampati: III  TM Distance: <3 FB Neck ROM: Full    Dental no notable dental hx. (+) Partial Upper, Upper Dentures Upper Dentures, Partial Lower:   Pulmonary neg pulmonary ROS, former smoker   Pulmonary exam normal breath sounds clear to auscultation       Cardiovascular hypertension, negative cardio ROS Normal cardiovascular exam+ Valvular Problems/Murmurs  Rhythm:Regular Rate:Normal     Neuro/Psych  Neuromuscular disease negative neurological ROS  negative psych ROS   GI/Hepatic negative GI ROS, Neg liver ROS, hiatal hernia,,,  Endo/Other  negative endocrine ROSHypothyroidism    Renal/GU Renal diseasenegative Renal ROS  negative genitourinary   Musculoskeletal negative musculoskeletal ROS (+) Arthritis ,    Abdominal   Peds negative pediatric ROS (+)  Hematology negative hematology ROS (+) Blood dyscrasia, anemia   Anesthesia Other Findings Previous cataract surgery 05-30-24 Dr. Ola   Office note: Anemia is likely secondary to chronic kidney disease and alcohol-induced bone marrow suppression.   Villous adenoma of colon       Hiatal hernia Family hx colonic polyps        Hypertension Diverticulitis             Arthritis Osteoporosis             Allergy  Heart murmurBlood transfusion without reported diagnosis Anemia             Chronic kidney disease, stage 3b (HCC) Anemia of chronic disease       Habitual alcohol use Subclinical hypothyroidism            Fatty liver Peripheral neuropathy      Chronic alcoholic liver disease   Reproductive/Obstetrics negative OB ROS                              Anesthesia Physical Anesthesia Plan  ASA: 3  Anesthesia Plan: MAC    Post-op Pain Management:    Induction: Intravenous  PONV Risk Score and Plan:   Airway Management Planned: Natural Airway and Nasal Cannula  Additional Equipment:   Intra-op Plan:   Post-operative Plan:   Informed Consent: I have reviewed the patients History and Physical, chart, labs and discussed the procedure including the risks, benefits and alternatives for the proposed anesthesia with the patient or authorized representative who has indicated his/her understanding and acceptance.     Dental Advisory Given  Plan Discussed with: Anesthesiologist, CRNA and Surgeon  Anesthesia Plan Comments: (Patient consented for risks of anesthesia including but not limited to:  - adverse reactions to medications - damage to eyes, teeth, lips or other oral mucosa - nerve damage due to positioning  - sore throat or hoarseness - Damage to heart, brain, nerves, lungs, other parts of body or loss of life  Patient voiced understanding and assent.)         Anesthesia Quick Evaluation

## 2024-06-12 NOTE — Discharge Instructions (Signed)

## 2024-06-13 ENCOUNTER — Ambulatory Visit
Admission: RE | Admit: 2024-06-13 | Discharge: 2024-06-13 | Disposition: A | Discharge status: Home / Self Care | Attending: Ophthalmology | Admitting: Ophthalmology

## 2024-06-13 ENCOUNTER — Ambulatory Visit: Payer: Self-pay | Admitting: Anesthesiology

## 2024-06-13 ENCOUNTER — Encounter: Payer: Self-pay | Admitting: Ophthalmology

## 2024-06-13 ENCOUNTER — Encounter
Admission: RE | Disposition: A | Discharge status: Home / Self Care | Payer: Self-pay | Source: Home / Self Care | Attending: Ophthalmology

## 2024-06-13 ENCOUNTER — Other Ambulatory Visit: Payer: Self-pay

## 2024-06-13 HISTORY — PX: CATARACT EXTRACTION W/PHACO: SHX586

## 2024-06-13 SURGERY — PHACOEMULSIFICATION, CATARACT, WITH IOL INSERTION
Anesthesia: Monitor Anesthesia Care | Site: Eye | Laterality: Right

## 2024-06-13 MED ORDER — PHENYLEPHRINE HCL 10 % OP SOLN
1.0000 [drp] | OPHTHALMIC | Status: AC
  Administered 2024-06-13 (×3): 1 [drp] via OPHTHALMIC

## 2024-06-13 MED ORDER — FENTANYL CITRATE (PF) 100 MCG/2ML IJ SOLN
INTRAMUSCULAR | Status: AC
  Filled 2024-06-13: qty 2

## 2024-06-13 MED ORDER — SIGHTPATH DOSE#1 BSS IO SOLN
INTRAOCULAR | Status: DC | PRN
  Administered 2024-06-13: 62 mL via OPHTHALMIC

## 2024-06-13 MED ORDER — CYCLOPENTOLATE HCL 2 % OP SOLN
OPHTHALMIC | Status: AC
  Filled 2024-06-13: qty 2

## 2024-06-13 MED ORDER — PHENYLEPHRINE HCL 10 % OP SOLN
OPHTHALMIC | Status: AC
  Filled 2024-06-13: qty 5

## 2024-06-13 MED ORDER — TETRACAINE HCL 0.5 % OP SOLN
OPHTHALMIC | Status: AC
  Filled 2024-06-13: qty 4

## 2024-06-13 MED ORDER — LACTATED RINGERS IV SOLN: INTRAVENOUS | Status: DC

## 2024-06-13 MED ORDER — CYCLOPENTOLATE HCL 2 % OP SOLN
1.0000 [drp] | OPHTHALMIC | Status: AC
  Administered 2024-06-13 (×3): 1 [drp] via OPHTHALMIC

## 2024-06-13 MED ORDER — TETRACAINE HCL 0.5 % OP SOLN
1.0000 [drp] | OPHTHALMIC | Status: DC | PRN
  Administered 2024-06-13 (×3): 1 [drp] via OPHTHALMIC

## 2024-06-13 MED ORDER — MIDAZOLAM HCL 2 MG/2ML IJ SOLN
INTRAMUSCULAR | Status: AC
  Filled 2024-06-13: qty 2

## 2024-06-13 MED ORDER — SIGHTPATH DOSE#1 NA CHONDROIT SULF-NA HYALURON 40-17 MG/ML IO SOLN
INTRAOCULAR | Status: DC | PRN
  Administered 2024-06-13: 1 mL via INTRAOCULAR

## 2024-06-13 MED ORDER — BRIMONIDINE TARTRATE-TIMOLOL 0.2-0.5 % OP SOLN
OPHTHALMIC | Status: DC | PRN
  Administered 2024-06-13: 1 [drp] via OPHTHALMIC

## 2024-06-13 MED ORDER — MOXIFLOXACIN HCL 0.5 % OP SOLN
OPHTHALMIC | Status: DC | PRN
  Administered 2024-06-13: .2 mL via OPHTHALMIC

## 2024-06-13 MED ORDER — SIGHTPATH DOSE#1 BSS IO SOLN
INTRAOCULAR | Status: DC | PRN
  Administered 2024-06-13: 15 mL via INTRAOCULAR

## 2024-06-13 MED ORDER — FENTANYL CITRATE (PF) 100 MCG/2ML IJ SOLN
INTRAMUSCULAR | Status: DC | PRN
  Administered 2024-06-13 (×2): 25 ug via INTRAVENOUS
  Administered 2024-06-13: 50 ug via INTRAVENOUS

## 2024-06-13 MED ORDER — LIDOCAINE HCL (PF) 2 % IJ SOLN
INTRAOCULAR | Status: DC | PRN
  Administered 2024-06-13: 2 mL

## 2024-06-13 SURGICAL SUPPLY — 10 items
CANNULA ANT/CHMB 27G (MISCELLANEOUS) ×1 IMPLANT
CYSTOTOME ANGL RVRS SHRT 25G (CUTTER) ×1 IMPLANT
FEE CATARACT SUITE SIGHTPATH (MISCELLANEOUS) ×1 IMPLANT
GLOVE BIOGEL PI IND STRL 8 (GLOVE) ×1 IMPLANT
GLOVE SURG LX STRL 8.0 MICRO (GLOVE) ×1 IMPLANT
GLOVE SURG SYN 6.5 PF PI BL (GLOVE) ×1 IMPLANT
LENS IOL TECNIS EYHANCE 23.5 (Intraocular Lens) IMPLANT
NDL FILTER BLUNT 18X1 1/2 (NEEDLE) ×1 IMPLANT
RING MALYGIN (MISCELLANEOUS) IMPLANT
SYR 3ML LL SCALE MARK (SYRINGE) ×1 IMPLANT

## 2024-06-13 NOTE — Anesthesia Postprocedure Evaluation (Signed)
 Anesthesia Post Note  Patient: Margaret Nguyen  Procedure(s) Performed: PHACOEMULSIFICATION, CATARACT, WITH IOL INSERTION 9.49 00:59.4 (Right: Eye)  Patient location during evaluation: PACU Anesthesia Type: MAC Level of consciousness: awake and alert Pain management: pain level controlled Vital Signs Assessment: post-procedure vital signs reviewed and stable Respiratory status: spontaneous breathing, nonlabored ventilation, respiratory function stable and patient connected to nasal cannula oxygen Cardiovascular status: stable and blood pressure returned to baseline Postop Assessment: no apparent nausea or vomiting Anesthetic complications: no   No notable events documented.   Last Vitals:  Vitals:   06/13/24 1113 06/13/24 1115  BP:  138/80  Pulse: 70 72  Resp: 15 (!) 23  Temp:    SpO2: 98% 98%    Last Pain:  Vitals:   06/13/24 1113  TempSrc:   PainSc: 0-No pain                 Rikia Sukhu C Helaine Yackel

## 2024-06-13 NOTE — Transfer of Care (Signed)
 Immediate Anesthesia Transfer of Care Note  Patient: Margaret Nguyen  Procedure(s) Performed: PHACOEMULSIFICATION, CATARACT, WITH IOL INSERTION 9.49 00:59.4 (Right: Eye)  Patient Location: PACU  Anesthesia Type: MAC  Level of Consciousness: awake, alert  and patient cooperative  Airway and Oxygen Therapy: Patient Spontanous Breathing and Patient connected to supplemental oxygen  Post-op Assessment: Post-op Vital signs reviewed, Patient's Cardiovascular Status Stable, Respiratory Function Stable, Patent Airway and No signs of Nausea or vomiting  Post-op Vital Signs: Reviewed and stable  Complications: No notable events documented.

## 2024-06-13 NOTE — H&P (Signed)
 [facility]   Primary Care Physician:  Rilla Baller, MD Ophthalmologist: Dr. Elsie Carmine  Pre-Procedure History & Physical: HPI:  Margaret Nguyen is a 83 y.o. female here for cataract surgery.   Past Medical History:  Diagnosis Date   Allergy     Anemia    Anemia of chronic disease    Arthritis    Blood transfusion without reported diagnosis    Chronic alcoholic liver disease 05/06/2023   attempting to cut back on ETOH   Chronic kidney disease, stage 3b (HCC)    Diverticulitis    Family hx colonic polyps    Fatty liver    Habitual alcohol use    2 shots of vodka daily, a fifth a week   Heart murmur    has Rheumatic fever as a child    Hiatal hernia    Hypertension    Osteoporosis 08/2009   R femur -2.5   Peripheral neuropathy 08/28/2022   bilateral symmetrical length dependent sensorineuropathy -likely alcohol related   Subclinical hypothyroidism    Villous adenoma of colon     Past Surgical History:  Procedure Laterality Date   ABDOMINAL HYSTERECTOMY     CATARACT EXTRACTION W/PHACO Left 05/30/2024   Procedure: PHACOEMULSIFICATION, CATARACT, WITH IOL INSERTION 8.82 01:23.7;  Surgeon: Carmine Elsie, MD;  Location: [facility];  Service: Ophthalmology;  Laterality: Left;   COLONOSCOPY  04/2010   polyps, diverticulosis, hem, rec rpt 3 yrs Oma)   COLONOSCOPY  04/2014   mult TA, one with high grade dysplasia, mod diverticulosis, rpt 3 yrs Oma)   COLONOSCOPY  03/2019   1 TA, diverticulosis, no f/u planned Oma)   DEXA  08/2009   femur -2.5   DEXA  04/2015   T -3 hips, -1.8 spine   LAPAROSCOPIC SIGMOID COLECTOMY  1999?   villous adenoma   PARTIAL HYSTERECTOMY     ovaries remain   TOE SURGERY     TUBAL LIGATION      Prior to Admission medications   Medication Sig Start Date End Date Taking? Authorizing Provider  acetaminophen  (TYLENOL ) 500 MG tablet Take 1 tablet (500 mg total) by mouth every 6 (six) hours as needed.  05/11/17  Yes Rilla Baller, MD  Calcium  Carb-Cholecalciferol 500-400 MG-UNIT TABS Take by mouth.   Yes [provider]  Cholecalciferol (VITAMIN D3) 25 MCG (1000 UT) CAPS Take by mouth.   Yes [provider]  losartan -hydrochlorothiazide  (HYZAAR) 50-12.5 MG tablet Take 1 tablet by mouth daily. For high blood pressure 04/25/24  Yes Rilla Baller, MD  Multiple Vitamins-Minerals (CENTRUM PO) Take by mouth.   Yes [provider]  potassium chloride  (KLOR-CON ) 10 MEQ tablet Take 1 tablet (10 mEq total) by mouth daily. 04/25/24  Yes Rilla Baller, MD  vitamin B-12 (CYANOCOBALAMIN ) 500 MCG tablet Take 1 tablet (500 mcg total) by mouth once a week. 02/26/23  Yes Rilla Baller, MD  vitamin C (ASCORBIC ACID) 500 MG tablet Take 500 mg by mouth daily.   Yes [provider]  colchicine  0.6 MG tablet TAKE 1 TABLET (0.6 MG TOTAL) BY MOUTH DAILY AS NEEDED (GOUT FLARE). 05/24/24   Rilla Baller, MD    Allergies as of 05/12/2024 - Review Complete 04/25/2024  Allergen Reaction Noted   Other Swelling 04/19/2014   Shellfish allergy  Swelling 02/25/2022    Family History  Problem Relation Age of Onset   Arthritis Mother    Arthritis Father    Heart disease Father    Hypertension Father  Allergic rhinitis Daughter    Allergic rhinitis Son    Prostate cancer Son    Colon cancer Neg Hx    Esophageal cancer Neg Hx    Rectal cancer Neg Hx    Stomach cancer Neg Hx    Colon polyps Neg Hx     Social History   Socioeconomic History   Marital status: Widowed    Spouse name: Not on file   Number of children: Not on file   Years of education: Not on file   Highest education level: Not on file  Occupational History   Not on file  Tobacco Use   Smoking status: Former    Current packs/day: 0.00    Types: Cigarettes    Quit date: 07/13/1978    Years since quitting: 45.9    Passive exposure: Past   Smokeless tobacco: Never  Vaping Use   Vaping  status: Never Used  Substance and Sexual Activity   Alcohol use: Yes    Alcohol/week: 1.0 standard drink of alcohol    Types: 1 Shots of liquor per week    Comment: weekly   Drug use: Never   Sexual activity: Not Currently  Other Topics Concern   Not on file  Social History Narrative   Married [phone], widowed   1 son, 2 daughters, 1 son deceased   5 grandchildren   Lives alone; I- ADLs   Working-fulltime [employer]         Social Drivers of Health   Financial Resource Strain: Low Risk  (02/04/2024)   Overall Financial Resource Strain (CARDIA)    Difficulty of Paying Living Expenses: Not hard at all  Food Insecurity: No Food Insecurity (02/04/2024)   Hunger Vital Sign    Worried About Running Out of Food in the Last Year: Never true    Ran Out of Food in the Last Year: Never true  Transportation Needs: No Transportation Needs (02/04/2024)   PRAPARE - Administrator, Civil Service (Medical): No    Lack of Transportation (Non-Medical): No  Physical Activity: Inactive (02/04/2024)   Exercise Vital Sign    Days of Exercise per Week: 0 days    Minutes of Exercise per Session: 0 min  Stress: No Stress Concern Present (02/04/2024)   [employer] of Occupational Health - Occupational Stress Questionnaire    Feeling of Stress: Not at all  Social Connections: Moderately Isolated (02/04/2024)   Social Connection and Isolation Panel    Frequency of Communication with Friends and Family: More than three times a week    Frequency of Social Gatherings with Friends and Family: More than three times a week    Attends Religious Services: 1 to 4 times per year    Active Member of [employer] or Organizations: No    Attends Banker Meetings: Never    Marital Status: Widowed  Intimate Partner Violence: Not At Risk (02/04/2024)   Humiliation, Afraid, Rape, and Kick questionnaire    Fear of Current or Ex-Partner: No    Emotionally Abused: No    Physically Abused: No     Sexually Abused: No    Review of Systems: See HPI, otherwise negative ROS  Physical Exam: BP 133/65   Pulse 83   Temp (!) 97 F (36.1 C) (Temporal)   Resp (!) 26   Ht 5' 1 (1.549 m)   Wt 40 kg   SpO2 99%   BMI 16.66 kg/m  General:   Alert, cooperative. Head:  Normocephalic and atraumatic. Respiratory:  Normal work of breathing. Cardiovascular:  NAD  Impression/Plan: Margaret Nguyen is here for cataract surgery.  Risks, benefits, limitations, and alternatives regarding cataract surgery have been reviewed with the patient.  Questions have been answered.  All parties agreeable.   Elsie Carmine, MD  06/13/2024, 10:44 AM

## 2024-06-13 NOTE — Op Note (Signed)
 PREOPERATIVE DIAGNOSIS:  Nuclear sclerotic cataract of the right eye.   POSTOPERATIVE DIAGNOSIS:  Right Eye Cataract   OPERATIVE PROCEDURE:ORPROCALL@   SURGEON:  Elsie Carmine, MD.   ANESTHESIA:  Anesthesiologist: Ola Donny BROCKS, MD CRNA: Jahoo, Sonia, CRNA  1.      Managed anesthesia care. 2.      0.30ml of Shugarcaine was instilled in the eye following the paracentesis.   COMPLICATIONS: Viscoelastic was used to raise the pupil margin.  A  Malyugin ring was placed as the pupil would not achieve sufficient pharmacologic dilation to undergo cataract extraction safely.( The ring was removed atraumatically following insertion of the IOL.)    TECHNIQUE:   Stop and chop   DESCRIPTION OF PROCEDURE:  The patient was examined and consented in the preoperative holding area where the aforementioned topical anesthesia was applied to the right eye and then brought back to the Operating Room where the right eye was prepped and draped in the usual sterile ophthalmic fashion and a lid speculum was placed. A paracentesis was created with the side port blade and the anterior chamber was filled with viscoelastic. A near clear corneal incision was performed with the steel keratome. A continuous curvilinear capsulorrhexis was performed with a cystotome followed by the capsulorrhexis forceps. Hydrodissection and hydrodelineation were carried out with BSS on a blunt cannula. The lens was removed in a stop and chop  technique and the remaining cortical material was removed with the irrigation-aspiration handpiece. The capsular bag was inflated with viscoelastic and the intraocular lens was placed in the capsular bag without complication. The remaining viscoelastic was removed from the eye with the irrigation-aspiration handpiece. The wounds were hydrated. The anterior chamber was flushed with BSS and the eye was inflated to physiologic pressure. 0.56ml of Vigamox  was placed in the anterior chamber. The wounds were  found to be water tight. The eye was dressed with Combigan . The patient was given protective glasses to wear throughout the day and a shield with which to sleep tonight. The patient was also given drops with which to begin a drop regimen today and will follow-up with me in one day. Implant Name Type Inv. Item Serial No. Manufacturer Lot No. LRB No. Used Action  LENS IOL TECNIS EYHANCE 23.5 - [id number] Intraocular Lens LENS IOL TECNIS EYHANCE 23.5 [id number] [employer]  Right 1 Implanted   Procedure(s): PHACOEMULSIFICATION, CATARACT, WITH IOL INSERTION 9.49 00:59.4 (Right)  Electronically signed: Elsie Carmine 06/13/2024 11:11 AM

## 2024-10-24 ENCOUNTER — Ambulatory Visit: Admitting: Family Medicine

## 2025-02-06 ENCOUNTER — Ambulatory Visit
